# Patient Record
Sex: Male | Born: 1955
Health system: Southern US, Community
[De-identification: ages and names within clinical notes are randomized; demographics above are authoritative.]

## PROBLEM LIST (undated history)

## (undated) DIAGNOSIS — Z7902 Long term (current) use of antithrombotics/antiplatelets: Secondary | ICD-10-CM

## (undated) DIAGNOSIS — I779 Disorder of arteries and arterioles, unspecified: Secondary | ICD-10-CM

## (undated) DIAGNOSIS — N433 Hydrocele, unspecified: Secondary | ICD-10-CM

## (undated) DIAGNOSIS — E785 Hyperlipidemia, unspecified: Secondary | ICD-10-CM

## (undated) DIAGNOSIS — Q2112 Patent foramen ovale: Secondary | ICD-10-CM

## (undated) DIAGNOSIS — Q211 Atrial septal defect: Secondary | ICD-10-CM

## (undated) DIAGNOSIS — I639 Cerebral infarction, unspecified: Secondary | ICD-10-CM

## (undated) DIAGNOSIS — Z720 Tobacco use: Secondary | ICD-10-CM

## (undated) DIAGNOSIS — B1920 Unspecified viral hepatitis C without hepatic coma: Secondary | ICD-10-CM

## (undated) DIAGNOSIS — J449 Chronic obstructive pulmonary disease, unspecified: Secondary | ICD-10-CM

## (undated) DIAGNOSIS — I739 Peripheral vascular disease, unspecified: Secondary | ICD-10-CM

## (undated) DIAGNOSIS — K7400 Hepatic fibrosis, unspecified: Secondary | ICD-10-CM

## (undated) HISTORY — DX: Peripheral vascular disease, unspecified: I73.9

## (undated) HISTORY — PX: JOINT REPLACEMENT: SHX530

## (undated) HISTORY — DX: Tobacco use: Z72.0

## (undated) HISTORY — PX: KNEE ARTHROSCOPY: SUR90

## (undated) HISTORY — PX: BACK SURGERY: SHX140

## (undated) HISTORY — DX: Hyperlipidemia, unspecified: E78.5

## (undated) HISTORY — DX: Disorder of arteries and arterioles, unspecified: I77.9

## (undated) HISTORY — DX: Atrial septal defect: Q21.1

## (undated) HISTORY — DX: Patent foramen ovale: Q21.12

## (undated) HISTORY — DX: Cerebral infarction, unspecified: I63.9

---

## 2010-10-31 DIAGNOSIS — J189 Pneumonia, unspecified organism: Secondary | ICD-10-CM

## 2010-10-31 HISTORY — DX: Pneumonia, unspecified organism: J18.9

## 2010-11-02 ENCOUNTER — Observation Stay (HOSPITAL_COMMUNITY)
Admission: EM | Admit: 2010-11-02 | Discharge: 2010-11-03 | Payer: Self-pay | Source: Home / Self Care | Attending: Internal Medicine | Admitting: Internal Medicine

## 2010-11-03 LAB — COMPREHENSIVE METABOLIC PANEL
ALT: 23 U/L (ref 0–53)
AST: 25 U/L (ref 0–37)
Albumin: 3.2 g/dL — ABNORMAL LOW (ref 3.5–5.2)
Alkaline Phosphatase: 53 U/L (ref 39–117)
BUN: 8 mg/dL (ref 6–23)
CO2: 26 mEq/L (ref 19–32)
Calcium: 8.6 mg/dL (ref 8.4–10.5)
Chloride: 105 mEq/L (ref 96–112)
Creatinine, Ser: 0.8 mg/dL (ref 0.4–1.5)
GFR calc Af Amer: >60 mL/min (ref >60)
GFR calc non Af Amer: >60 mL/min (ref >60)
Glucose, Bld: 108 mg/dL — ABNORMAL HIGH (ref 70–99)
Potassium: 3.7 mEq/L (ref 3.5–5.1)
Sodium: 136 mEq/L (ref 135–145)
Total Bilirubin: 0.6 mg/dL (ref 0.3–1.2)
Total Protein: 6.4 g/dL (ref 6.0–8.3)

## 2010-11-03 LAB — URINALYSIS, ROUTINE W REFLEX MICROSCOPIC
Bilirubin Urine: NEGATIVE
Hemoglobin, Urine: NEGATIVE
Ketones, ur: NEGATIVE mg/dL
Nitrite: NEGATIVE
Protein, ur: NEGATIVE mg/dL
Specific Gravity, Urine: >1.046 — ABNORMAL HIGH (ref 1.005–1.030)
Urine Glucose, Fasting: NEGATIVE mg/dL
Urobilinogen, UA: 0.2 mg/dL (ref 0.0–1.0)
pH: 6 (ref 5.0–8.0)

## 2010-11-03 LAB — MAGNESIUM: Magnesium: 2.4 mg/dL (ref 1.5–2.5)

## 2010-11-03 LAB — CBC
HCT: 44.1 % (ref 39.0–52.0)
Hemoglobin: 15.1 g/dL (ref 13.0–17.0)
MCH: 32.5 pg (ref 26.0–34.0)
MCHC: 34.2 g/dL (ref 30.0–36.0)
MCV: 95 fL (ref 78.0–100.0)
Platelets: 274 10*3/uL (ref 150–400)
RBC: 4.64 MIL/uL (ref 4.22–5.81)
RDW: 13.1 % (ref 11.5–15.5)
WBC: 7.6 10*3/uL (ref 4.0–10.5)

## 2010-11-03 LAB — CARDIAC PANEL(CRET KIN+CKTOT+MB+TROPI)
CK, MB: 1 ng/mL (ref 0.3–4.0)
Relative Index: INVALID (ref 0.0–2.5)
Total CK: 58 U/L (ref 7–232)
Troponin I: 0.01 ng/mL (ref 0.00–0.06)

## 2011-01-10 LAB — T4, FREE: Free T4: 1.02 ng/dL (ref 0.80–1.80)

## 2011-01-10 LAB — CBC
HCT: 44.7 % (ref 39.0–52.0)
Hemoglobin: 15.6 g/dL (ref 13.0–17.0)
MCH: 33.1 pg (ref 26.0–34.0)
MCHC: 34.9 g/dL (ref 30.0–36.0)
MCV: 94.9 fL (ref 78.0–100.0)
Platelets: 351 10*3/uL (ref 150–400)
RBC: 4.71 MIL/uL (ref 4.22–5.81)
RDW: 13.1 % (ref 11.5–15.5)
WBC: 10 10*3/uL (ref 4.0–10.5)

## 2011-01-10 LAB — POCT CARDIAC MARKERS
CKMB, poc: <1 ng/mL — ABNORMAL LOW (ref 1.0–8.0)
Myoglobin, poc: 44 ng/mL (ref 12–200)
Troponin i, poc: <0.05 ng/mL (ref 0.00–0.09)

## 2011-01-10 LAB — POCT I-STAT, CHEM 8
BUN: 9 mg/dL (ref 6–23)
Calcium, Ion: 1.1 mmol/L — ABNORMAL LOW (ref 1.12–1.32)
Chloride: 105 mEq/L (ref 96–112)
Creatinine, Ser: 0.8 mg/dL (ref 0.4–1.5)
Glucose, Bld: 86 mg/dL (ref 70–99)
HCT: 47 % (ref 39.0–52.0)
Hemoglobin: 16 g/dL (ref 13.0–17.0)
Potassium: 3.6 mEq/L (ref 3.5–5.1)
Sodium: 139 mEq/L (ref 135–145)
TCO2: 25 mmol/L (ref 0–100)

## 2011-01-10 LAB — TSH: TSH: 3.249 u[IU]/mL (ref 0.350–4.500)

## 2012-08-15 ENCOUNTER — Ambulatory Visit (HOSPITAL_COMMUNITY)
Admission: RE | Admit: 2012-08-15 | Discharge: 2012-08-15 | Disposition: A | Discharge status: Home / Self Care | Payer: BC Managed Care – PPO | Source: Ambulatory Visit | Attending: Specialist | Admitting: Specialist

## 2012-08-15 ENCOUNTER — Other Ambulatory Visit (HOSPITAL_COMMUNITY): Payer: Self-pay | Admitting: Specialist

## 2012-08-15 DIAGNOSIS — Z139 Encounter for screening, unspecified: Secondary | ICD-10-CM

## 2012-08-15 DIAGNOSIS — Z1389 Encounter for screening for other disorder: Secondary | ICD-10-CM | POA: Insufficient documentation

## 2017-10-09 ENCOUNTER — Encounter (HOSPITAL_COMMUNITY)
Admission: EM | Disposition: A | Discharge status: Home / Self Care | Payer: Self-pay | Source: Home / Self Care | Attending: Neurology

## 2017-10-09 ENCOUNTER — Encounter (HOSPITAL_COMMUNITY): Payer: Self-pay | Admitting: Emergency Medicine

## 2017-10-09 ENCOUNTER — Inpatient Hospital Stay (HOSPITAL_COMMUNITY): Payer: 59

## 2017-10-09 ENCOUNTER — Emergency Department (HOSPITAL_COMMUNITY): Payer: 59

## 2017-10-09 ENCOUNTER — Emergency Department (HOSPITAL_COMMUNITY): Payer: 59 | Admitting: Anesthesiology

## 2017-10-09 ENCOUNTER — Inpatient Hospital Stay (HOSPITAL_COMMUNITY)
Admission: EM | Admit: 2017-10-09 | Discharge: 2017-10-11 | DRG: 023 | Disposition: A | Discharge status: Home / Self Care | Payer: 59 | Attending: Neurology | Admitting: Neurology

## 2017-10-09 DIAGNOSIS — E785 Hyperlipidemia, unspecified: Secondary | ICD-10-CM | POA: Diagnosis present

## 2017-10-09 DIAGNOSIS — R29718 NIHSS score 18: Secondary | ICD-10-CM | POA: Diagnosis not present

## 2017-10-09 DIAGNOSIS — G8194 Hemiplegia, unspecified affecting left nondominant side: Secondary | ICD-10-CM | POA: Diagnosis present

## 2017-10-09 DIAGNOSIS — I63511 Cerebral infarction due to unspecified occlusion or stenosis of right middle cerebral artery: Principal | ICD-10-CM | POA: Diagnosis present

## 2017-10-09 DIAGNOSIS — R29708 NIHSS score 8: Secondary | ICD-10-CM | POA: Diagnosis not present

## 2017-10-09 DIAGNOSIS — R414 Neurologic neglect syndrome: Secondary | ICD-10-CM | POA: Diagnosis present

## 2017-10-09 DIAGNOSIS — R29721 NIHSS score 21: Secondary | ICD-10-CM | POA: Diagnosis not present

## 2017-10-09 DIAGNOSIS — D72829 Elevated white blood cell count, unspecified: Secondary | ICD-10-CM | POA: Diagnosis present

## 2017-10-09 DIAGNOSIS — J9601 Acute respiratory failure with hypoxia: Secondary | ICD-10-CM | POA: Diagnosis not present

## 2017-10-09 DIAGNOSIS — R29810 Facial weakness: Secondary | ICD-10-CM | POA: Diagnosis present

## 2017-10-09 DIAGNOSIS — F17210 Nicotine dependence, cigarettes, uncomplicated: Secondary | ICD-10-CM | POA: Diagnosis present

## 2017-10-09 DIAGNOSIS — I6521 Occlusion and stenosis of right carotid artery: Secondary | ICD-10-CM | POA: Diagnosis present

## 2017-10-09 DIAGNOSIS — I63231 Cerebral infarction due to unspecified occlusion or stenosis of right carotid arteries: Secondary | ICD-10-CM | POA: Diagnosis not present

## 2017-10-09 DIAGNOSIS — R339 Retention of urine, unspecified: Secondary | ICD-10-CM | POA: Diagnosis not present

## 2017-10-09 DIAGNOSIS — I779 Disorder of arteries and arterioles, unspecified: Secondary | ICD-10-CM

## 2017-10-09 DIAGNOSIS — I63311 Cerebral infarction due to thrombosis of right middle cerebral artery: Secondary | ICD-10-CM | POA: Diagnosis not present

## 2017-10-09 DIAGNOSIS — R29717 NIHSS score 17: Secondary | ICD-10-CM | POA: Diagnosis not present

## 2017-10-09 DIAGNOSIS — I10 Essential (primary) hypertension: Secondary | ICD-10-CM | POA: Diagnosis present

## 2017-10-09 DIAGNOSIS — I639 Cerebral infarction, unspecified: Secondary | ICD-10-CM

## 2017-10-09 DIAGNOSIS — I6789 Other cerebrovascular disease: Secondary | ICD-10-CM | POA: Diagnosis not present

## 2017-10-09 DIAGNOSIS — R1310 Dysphagia, unspecified: Secondary | ICD-10-CM | POA: Diagnosis present

## 2017-10-09 HISTORY — PX: IR INTRAVSC STENT CERV CAROTID W/O EMB-PROT MOD SED INC ANGIO: IMG2304

## 2017-10-09 HISTORY — PX: IR ANGIO EXTRACRAN SEL COM CAROTID INNOMINATE UNI L MOD SED: IMG5355

## 2017-10-09 HISTORY — PX: RADIOLOGY WITH ANESTHESIA: SHX6223

## 2017-10-09 HISTORY — PX: IR ANGIO VERTEBRAL SEL SUBCLAVIAN INNOMINATE UNI R MOD SED: IMG5365

## 2017-10-09 HISTORY — DX: Disorder of arteries and arterioles, unspecified: I77.9

## 2017-10-09 HISTORY — DX: Cerebral infarction due to unspecified occlusion or stenosis of right middle cerebral artery: I63.511

## 2017-10-09 HISTORY — PX: IR PERCUTANEOUS ART THROMBECTOMY/INFUSION INTRACRANIAL INC DIAG ANGIO: IMG6087

## 2017-10-09 LAB — I-STAT CHEM 8, ED
BUN: 9 mg/dL (ref 6–20)
Calcium, Ion: 1.09 mmol/L — ABNORMAL LOW (ref 1.15–1.40)
Chloride: 105 mmol/L (ref 101–111)
Creatinine, Ser: 0.7 mg/dL (ref 0.61–1.24)
Glucose, Bld: 96 mg/dL (ref 65–99)
HCT: 50 % (ref 39.0–52.0)
Hemoglobin: 17 g/dL (ref 13.0–17.0)
Potassium: 3.7 mmol/L (ref 3.5–5.1)
Sodium: 138 mmol/L (ref 135–145)
TCO2: 22 mmol/L (ref 22–32)

## 2017-10-09 LAB — DIFFERENTIAL
Basophils Absolute: 0.1 10*3/uL (ref 0.0–0.1)
Basophils Relative: 1 %
Eosinophils Absolute: 0.1 10*3/uL (ref 0.0–0.7)
Eosinophils Relative: 1 %
Lymphocytes Relative: 26 %
Lymphs Abs: 2.6 10*3/uL (ref 0.7–4.0)
Monocytes Absolute: 0.6 10*3/uL (ref 0.1–1.0)
Monocytes Relative: 7 %
Neutro Abs: 6.5 10*3/uL (ref 1.7–7.7)
Neutrophils Relative %: 65 %

## 2017-10-09 LAB — CBC
HCT: 47.5 % (ref 39.0–52.0)
Hemoglobin: 16.6 g/dL (ref 13.0–17.0)
MCH: 32.4 pg (ref 26.0–34.0)
MCHC: 34.9 g/dL (ref 30.0–36.0)
MCV: 92.8 fL (ref 78.0–100.0)
Platelets: 208 10*3/uL (ref 150–400)
RBC: 5.12 MIL/uL (ref 4.22–5.81)
RDW: 13.2 % (ref 11.5–15.5)
WBC: 9.9 10*3/uL (ref 4.0–10.5)

## 2017-10-09 LAB — COMPREHENSIVE METABOLIC PANEL
ALT: 29 U/L (ref 17–63)
AST: 25 U/L (ref 15–41)
Albumin: 3.9 g/dL (ref 3.5–5.0)
Alkaline Phosphatase: 62 U/L (ref 38–126)
Anion gap: 9 (ref 5–15)
BUN: 9 mg/dL (ref 6–20)
CO2: 21 mmol/L — ABNORMAL LOW (ref 22–32)
Calcium: 9 mg/dL (ref 8.9–10.3)
Chloride: 107 mmol/L (ref 101–111)
Creatinine, Ser: 0.78 mg/dL (ref 0.61–1.24)
GFR calc Af Amer: >60 mL/min (ref >60)
GFR calc non Af Amer: >60 mL/min (ref >60)
Glucose, Bld: 95 mg/dL (ref 65–99)
Potassium: 3.8 mmol/L (ref 3.5–5.1)
Sodium: 137 mmol/L (ref 135–145)
Total Bilirubin: 0.8 mg/dL (ref 0.3–1.2)
Total Protein: 7.1 g/dL (ref 6.5–8.1)

## 2017-10-09 LAB — CBG MONITORING, ED: Glucose-Capillary: 90 mg/dL (ref 65–99)

## 2017-10-09 LAB — PROTIME-INR
INR: 0.95
Prothrombin Time: 12.6 seconds (ref 11.4–15.2)

## 2017-10-09 LAB — I-STAT TROPONIN, ED: Troponin i, poc: 0 ng/mL (ref 0.00–0.08)

## 2017-10-09 LAB — APTT: aPTT: 32 seconds (ref 24–36)

## 2017-10-09 SURGERY — RADIOLOGY WITH ANESTHESIA
Anesthesia: General

## 2017-10-09 MED ORDER — HEPARIN SODIUM (PORCINE) 1000 UNIT/ML IJ SOLN
INTRAMUSCULAR | Status: DC | PRN
  Administered 2017-10-09: 500 [IU] via INTRAVENOUS
  Administered 2017-10-09: 1000 [IU] via INTRAVENOUS

## 2017-10-09 MED ORDER — TIROFIBAN HCL IN NACL 5-0.9 MG/100ML-% IV SOLN
0.1500 ug/kg/min | INTRAVENOUS | Status: DC
  Filled 2017-10-09: qty 100

## 2017-10-09 MED ORDER — STROKE: EARLY STAGES OF RECOVERY BOOK
Freq: Once | Status: AC
  Administered 2017-10-10: 02:00:00
  Filled 2017-10-09: qty 1

## 2017-10-09 MED ORDER — CEFAZOLIN SODIUM-DEXTROSE 2-4 GM/100ML-% IV SOLN
INTRAVENOUS | Status: AC
  Filled 2017-10-09: qty 100

## 2017-10-09 MED ORDER — LACTATED RINGERS IV SOLN
INTRAVENOUS | Status: DC | PRN
  Administered 2017-10-09: 19:00:00 via INTRAVENOUS

## 2017-10-09 MED ORDER — ASPIRIN 325 MG PO TABS
ORAL_TABLET | ORAL | Status: AC
  Filled 2017-10-09: qty 1

## 2017-10-09 MED ORDER — ACETAMINOPHEN 650 MG RE SUPP: 650.0000 mg | RECTAL | Status: DC | PRN

## 2017-10-09 MED ORDER — ASPIRIN 81 MG PO CHEW
CHEWABLE_TABLET | ORAL | Status: AC
  Filled 2017-10-09: qty 1

## 2017-10-09 MED ORDER — ONDANSETRON HCL 4 MG/2ML IJ SOLN: 4.0000 mg | Freq: Four times a day (QID) | INTRAMUSCULAR | Status: DC | PRN

## 2017-10-09 MED ORDER — NICARDIPINE HCL IN NACL 20-0.86 MG/200ML-% IV SOLN
0.0000 mg/h | INTRAVENOUS | Status: DC
  Administered 2017-10-10: 3 mg/h via INTRAVENOUS
  Filled 2017-10-09: qty 200

## 2017-10-09 MED ORDER — FENTANYL CITRATE (PF) 100 MCG/2ML IJ SOLN
INTRAMUSCULAR | Status: DC | PRN
  Administered 2017-10-09: 150 ug via INTRAVENOUS
  Administered 2017-10-09: 50 ug via INTRAVENOUS

## 2017-10-09 MED ORDER — TICAGRELOR 90 MG PO TABS
ORAL_TABLET | ORAL | Status: AC
  Filled 2017-10-09: qty 2

## 2017-10-09 MED ORDER — ACETAMINOPHEN 160 MG/5ML PO SOLN: 650.0000 mg | ORAL | Status: DC | PRN

## 2017-10-09 MED ORDER — SUCCINYLCHOLINE CHLORIDE 20 MG/ML IJ SOLN
INTRAMUSCULAR | Status: DC | PRN
  Administered 2017-10-09: 120 mg via INTRAVENOUS

## 2017-10-09 MED ORDER — ASPIRIN 325 MG PO TABS
ORAL_TABLET | ORAL | Status: AC | PRN
  Administered 2017-10-09: 325 mg

## 2017-10-09 MED ORDER — ACETAMINOPHEN 325 MG PO TABS
650.0000 mg | ORAL_TABLET | ORAL | Status: DC | PRN
  Administered 2017-10-11: 650 mg via ORAL
  Filled 2017-10-09: qty 2

## 2017-10-09 MED ORDER — NITROGLYCERIN 1 MG/10 ML FOR IR/CATH LAB
INTRA_ARTERIAL | Status: AC
  Filled 2017-10-09: qty 10

## 2017-10-09 MED ORDER — FENTANYL CITRATE (PF) 100 MCG/2ML IJ SOLN
INTRAMUSCULAR | Status: AC
  Filled 2017-10-09: qty 4

## 2017-10-09 MED ORDER — EPTIFIBATIDE 20 MG/10ML IV SOLN
INTRAVENOUS | Status: AC
  Filled 2017-10-09: qty 10

## 2017-10-09 MED ORDER — IOPAMIDOL (ISOVUE-300) INJECTION 61%
INTRAVENOUS | Status: AC
  Administered 2017-10-09: 86 mL
  Filled 2017-10-09: qty 300

## 2017-10-09 MED ORDER — GLYCOPYRROLATE 0.2 MG/ML IJ SOLN
INTRAMUSCULAR | Status: DC | PRN
  Administered 2017-10-09 (×2): 0.2 mg via INTRAVENOUS

## 2017-10-09 MED ORDER — ASPIRIN 325 MG PO TABS
325.0000 mg | ORAL_TABLET | Freq: Every day | ORAL | Status: DC
  Administered 2017-10-10 – 2017-10-11 (×2): 325 mg via ORAL
  Filled 2017-10-09 (×2): qty 1

## 2017-10-09 MED ORDER — IOPAMIDOL (ISOVUE-370) INJECTION 76%
INTRAVENOUS | Status: AC
  Administered 2017-10-09: 100 mL
  Filled 2017-10-09: qty 100

## 2017-10-09 MED ORDER — IOPAMIDOL (ISOVUE-300) INJECTION 61%
INTRAVENOUS | Status: AC
  Administered 2017-10-09: 70 mL
  Filled 2017-10-09: qty 150

## 2017-10-09 MED ORDER — CLOPIDOGREL BISULFATE 75 MG PO TABS
ORAL_TABLET | ORAL | Status: AC | PRN
  Administered 2017-10-09: 600 mg

## 2017-10-09 MED ORDER — FENTANYL CITRATE (PF) 100 MCG/2ML IJ SOLN
100.0000 ug | INTRAMUSCULAR | Status: AC | PRN
  Administered 2017-10-10 (×3): 100 ug via INTRAVENOUS
  Filled 2017-10-09 (×2): qty 2

## 2017-10-09 MED ORDER — PANTOPRAZOLE SODIUM 40 MG IV SOLR
40.0000 mg | Freq: Every day | INTRAVENOUS | Status: DC
  Administered 2017-10-10 – 2017-10-11 (×2): 40 mg via INTRAVENOUS
  Filled 2017-10-09 (×2): qty 40

## 2017-10-09 MED ORDER — PROPOFOL 10 MG/ML IV BOLUS
INTRAVENOUS | Status: DC | PRN
  Administered 2017-10-09: 50 mg via INTRAVENOUS
  Administered 2017-10-09: 70 mg via INTRAVENOUS
  Administered 2017-10-09: 50 mg via INTRAVENOUS
  Administered 2017-10-09: 130 mg via INTRAVENOUS

## 2017-10-09 MED ORDER — CLOPIDOGREL BISULFATE 300 MG PO TABS
ORAL_TABLET | ORAL | Status: AC
  Filled 2017-10-09: qty 2

## 2017-10-09 MED ORDER — LIDOCAINE HCL (CARDIAC) 20 MG/ML IV SOLN
INTRAVENOUS | Status: DC | PRN
  Administered 2017-10-09: 80 mg via INTRATRACHEAL

## 2017-10-09 MED ORDER — FENTANYL CITRATE (PF) 100 MCG/2ML IJ SOLN
100.0000 ug | INTRAMUSCULAR | Status: DC | PRN
  Administered 2017-10-10 (×2): 100 ug via INTRAVENOUS
  Filled 2017-10-09 (×3): qty 2

## 2017-10-09 MED ORDER — CEFAZOLIN SODIUM-DEXTROSE 2-3 GM-%(50ML) IV SOLR
INTRAVENOUS | Status: DC | PRN
  Administered 2017-10-09: 2 g via INTRAVENOUS

## 2017-10-09 MED ORDER — SODIUM CHLORIDE 0.9 % IV SOLN
INTRAVENOUS | Status: DC
  Administered 2017-10-11: 05:00:00 via INTRAVENOUS

## 2017-10-09 MED ORDER — TIROFIBAN HCL IN NACL 5-0.9 MG/100ML-% IV SOLN
0.1500 ug/kg/min | INTRAVENOUS | Status: DC
  Filled 2017-10-09 (×3): qty 100

## 2017-10-09 MED ORDER — PROPOFOL 1000 MG/100ML IV EMUL
0.0000 ug/kg/min | INTRAVENOUS | Status: DC
  Administered 2017-10-10: 40 ug/kg/min via INTRAVENOUS
  Administered 2017-10-10: 20 ug/kg/min via INTRAVENOUS
  Filled 2017-10-09 (×2): qty 100

## 2017-10-09 MED ORDER — NITROGLYCERIN 1 MG/10 ML FOR IR/CATH LAB
INTRA_ARTERIAL | Status: AC | PRN
  Administered 2017-10-09 (×3): 25 ug via INTRA_ARTERIAL

## 2017-10-09 MED ORDER — EPTIFIBATIDE 20 MG/10ML IV SOLN
INTRAVENOUS | Status: AC | PRN
  Administered 2017-10-09 (×8): 1.5 mg via INTRAVENOUS

## 2017-10-09 MED ORDER — CLOPIDOGREL BISULFATE 75 MG PO TABS
75.0000 mg | ORAL_TABLET | Freq: Every day | ORAL | Status: DC
  Administered 2017-10-10 – 2017-10-11 (×2): 75 mg via ORAL
  Filled 2017-10-09 (×2): qty 1

## 2017-10-09 MED ORDER — PHENYLEPHRINE HCL 10 MG/ML IJ SOLN
INTRAVENOUS | Status: DC | PRN
  Administered 2017-10-09: 50 ug/min via INTRAVENOUS

## 2017-10-09 MED ORDER — SODIUM CHLORIDE 0.9 % IV SOLN
1.0000 g | Freq: Once | INTRAVENOUS | Status: AC
  Administered 2017-10-10: 1 g via INTRAVENOUS
  Filled 2017-10-09: qty 10

## 2017-10-09 MED ORDER — SODIUM CHLORIDE 0.9 % IV SOLN
INTRAVENOUS | Status: DC
  Administered 2017-10-10 (×2): via INTRAVENOUS

## 2017-10-09 MED ORDER — ACETAMINOPHEN 325 MG PO TABS: 650.0000 mg | ORAL_TABLET | ORAL | Status: DC | PRN

## 2017-10-09 MED ORDER — ROCURONIUM BROMIDE 100 MG/10ML IV SOLN
INTRAVENOUS | Status: DC | PRN
  Administered 2017-10-09 (×3): 50 mg via INTRAVENOUS

## 2017-10-09 NOTE — Transfer of Care (Signed)
Immediate Anesthesia Transfer of Care Note  Patient: Raymond Lowery  Procedure(s) Performed: RADIOLOGY WITH ANESTHESIA (N/A )  Patient Location: ICU  Anesthesia Type:General  Level of Consciousness: sedated and Patient remains intubated per anesthesia plan  Airway & Oxygen Therapy: Patient remains intubated per anesthesia plan and Patient placed on Ventilator (see vital sign flow sheet for setting)  Post-op Assessment: Report given to RN and Post -op Vital signs reviewed and stable  Post vital signs: Reviewed and stable  Last Vitals:  Vitals:   10/09/17 1824 10/09/17 1830  BP:  134/75  Pulse: 76 79  Resp: (!) 23 (!) 21  Temp:    SpO2: 94% 96%    Last Pain:  Vitals:   10/09/17 1830  TempSrc:   PainSc: 1          Complications: No apparent anesthesia complications 

## 2017-10-09 NOTE — Progress Notes (Signed)
eLink Physician-Brief Progress Note Patient Name: Raymond Lowery DOB: [birth year] MRN: [id number]   Date of Service  10/09/2017  HPI/Events of Note  Request to place Foley Catheter. Patient is intubated and ventilated.   eICU Interventions  Will place Foley Catheter.      Intervention Category Major Interventions: Other:  Raymond Lowery,Raymond Lowery 10/09/2017, 11:33 PM

## 2017-10-09 NOTE — Anesthesia Procedure Notes (Signed)
Procedure Name: Intubation Date/Time: 10/09/2017 7:02 PM Performed by: Claris Che, CRNA Pre-anesthesia Checklist: Patient identified, Emergency Drugs available, Suction available, Patient being monitored and Timeout performed Patient Re-evaluated:Patient Re-evaluated prior to induction Oxygen Delivery Method: Circle system utilized Preoxygenation: Pre-oxygenation with 100% oxygen Induction Type: IV induction, Rapid sequence and Cricoid Pressure applied Laryngoscope Size: Mac and 3 Grade View: Grade II Tube type: Subglottic suction tube Tube size: 7.5 mm Number of attempts: 1 Airway Equipment and Method: Stylet Placement Confirmation: ETT inserted through vocal cords under direct vision,  positive ETCO2 and breath sounds checked- equal and bilateral Secured at: 23 cm Tube secured with: Tape Dental Injury: Teeth and Oropharynx as per pre-operative assessment

## 2017-10-09 NOTE — Transfer of Care (Deleted)
Immediate Anesthesia Transfer of Care Note  Patient: Raymond Lowery  Procedure(s) Performed: RADIOLOGY WITH ANESTHESIA (N/A )  Patient Location: ICU  Anesthesia Type:General  Level of Consciousness: sedated and Patient remains intubated per anesthesia plan  Airway & Oxygen Therapy: Patient remains intubated per anesthesia plan and Patient placed on Ventilator (see vital sign flow sheet for setting)  Post-op Assessment: Report given to RN and Post -op Vital signs reviewed and stable  Post vital signs: Reviewed and stable  Last Vitals:  Vitals:   10/09/17 1824 10/09/17 1830  BP:  134/75  Pulse: 76 79  Resp: (!) 23 (!) 21  Temp:    SpO2: 94% 96%    Last Pain:  Vitals:   10/09/17 1830  TempSrc:   PainSc: 1          Complications: No apparent anesthesia complications

## 2017-10-09 NOTE — Anesthesia Preprocedure Evaluation (Addendum)
Anesthesia Evaluation  Patient identified by MRN, date of birth, ID band Patient awake    Reviewed: Allergy & Precautions, H&P , NPO status , Patient's Chart, lab work & pertinent test results  Airway Mallampati: I  TM Distance: >3 FB Neck ROM: Full    Dental no notable dental hx. (+) Edentulous Upper, Edentulous Lower, Dental Advisory Given   Pulmonary neg pulmonary ROS,    Pulmonary exam normal breath sounds clear to auscultation       Cardiovascular negative cardio ROS   Rhythm:Regular Rate:Normal     Neuro/Psych CVA, Residual Symptoms negative psych ROS   GI/Hepatic negative GI ROS, Neg liver ROS,   Endo/Other  negative endocrine ROS  Renal/GU negative Renal ROS  negative genitourinary   Musculoskeletal   Abdominal   Peds  Hematology negative hematology ROS (+)   Anesthesia Other Findings   Reproductive/Obstetrics negative OB ROS                            Anesthesia Physical Anesthesia Plan  ASA: III and emergent  Anesthesia Plan: General   Post-op Pain Management:    Induction: Intravenous, Rapid sequence and Cricoid pressure planned  PONV Risk Score and Plan: 2 and Treatment may vary due to age or medical condition and Ondansetron  Airway Management Planned: Oral ETT  Additional Equipment: Arterial line  Intra-op Plan:   Post-operative Plan: Post-operative intubation/ventilation  Informed Consent: I have reviewed the patients History and Physical, chart, labs and discussed the procedure including the risks, benefits and alternatives for the proposed anesthesia with the patient or authorized representative who has indicated his/her understanding and acceptance.   Dental advisory given  Plan Discussed with: CRNA and Surgeon  Anesthesia Plan Comments:         Anesthesia Quick Evaluation

## 2017-10-09 NOTE — H&P (Addendum)
Neurology H&P  CC: Left Sided weakness  History is obtained from:Patient  HPI: Raymond Lowery is a 61 y.o. male with a history of tobacco abuse who presents with left sided weakness. He had transient symptoms this morning and subsequently improved. He actually worked for most of the day, but then hais wife saw him reaching with his right arm because he could not use his left arm well.  She therefore called 911.  She states that his left side has seemed to get somewhat better in the interim, but he still has significant deficits.   LKW: 0815am  tpa given?: no, outside of window.   ROS: A 14 point ROS was performed and is negative except as noted in the HPI.   PMHx: Tobacco abuse   FHx: No history of stroke  Social History: + smoker   Exam: Current vital signs: Vitals:   10/09/17 1824 10/09/17 1830  BP:  134/75  Pulse: 76 79  Resp: (!) 23 (!) 21  Temp:    SpO2: 94% 96%   Vital signs in last 24 hours:    Physical Exam  Constitutional: Appears well-developed and well-nourished.  Psych: Affect appropriate to situation Eyes: No scleral injection HENT: No OP obstrucion Head: Normocephalic.  Cardiovascular: Normal rate and regular rhythm.  Respiratory: Effort normal and breath sounds normal to anterior ascultation GI: Soft.  No distension. There is no tenderness.  Skin: WDI  Neuro: Mental Status: Patient is awake, alert, oriented to person, place, month, year, and situation. Patient is able to give a clear and coherent history. No signs of aphasia  He has significant left hemineglect Cranial Nerves: II: Visual Fields with partial left upper field cut. Pupils are equal, round, and reactive to light.   III,IV, VI: Right gaze preference, but does cross midline to the left V: Facial sensation is symmetric to temperature VII: Facial movement is decreased on the left VIII: hearing is intact to voice X: Uvula elevates symmetrically XI: Shoulder shrug is  symmetric. XII: tongue is midline without atrophy or fasciculations.  Motor: Tone is normal. Bulk is normal. 5/5 strength was present on the right side, 4/5 in the left Sensory: Sensation is decreased on the left, but patient does not seem aware of this, he does extinguish Cerebellar: No ataxia on the right, consistent with weakness on the  I have reviewed labs in epic and the results pertinent to this consultation are: CMP-unremarkable CBC-unremarkable  I have reviewed the images obtained: CT perfusion-large area of penumbra.  Impression: 61 year old male with right M1 occlusion, I suspect secondary to carotid disease.  He is going to IR for reperfusion. Though he had significant improvement in the interim between his initial symptoms and subsequent worsening, with his neglect and fact that he already has CT change, I am concerned he may have had some injury this morning. I would therefore favor pursuing reperfusion with IR therapy rather than tPA.   Recommendations: 1. Stroke intervention 2. MRI  of the brain without contrast 3. Frequent neuro checks 4. Echocardiogram 5. HgbA1c, fasting lipid panel 6. Prophylactic therapy-likely will need dual antiplatelet therapy if he receives a stent  7. Risk factor modification 8. Telemetry monitoring 9. PT consult, OT consult, Speech consult 10. please page stroke NP  Or  PA  Or MD  from 8am -4 pm as this patient will be followed by the stroke team at this point.   You can look them up on [email or web address]      This patient  is critically ill and at significant risk of neurological worsening, death and care requires constant monitoring of vital signs, hemodynamics,respiratory and cardiac monitoring, neurological assessment, discussion with family, other specialists and medical decision making of high complexity. I spent 60 minutes of neurocritical care time  in the care of  this patient.  Ritta SlotMcNeill Samra Pesch, MD [facility] [phone]  If 7pm- 7am, please page neurology on call as listed in AMION. 10/09/2017  5:53 PM

## 2017-10-09 NOTE — ED Triage Notes (Signed)
Pt arrives via EMS from home with complaints of slurred speech, left side weakness and facial droop. Per wife pt had slurred speech and facial droop at 8:15 but resolved immediately. Then pt began having left side weakness and drooping at 16:15. Code stroke called at [id number]

## 2017-10-09 NOTE — Consult Note (Signed)
PULMONARY / CRITICAL CARE MEDICINE   Name: Raymond Lowery MRN: [id number] DOB: 04-Dec-1955    ADMISSION DATE:  10/09/2017 CONSULTATION DATE:  10/09/17  REFERRING MD:  Amada Jupiter  CHIEF COMPLAINT:  Weakness  HISTORY OF PRESENT ILLNESS:  Pt is encephelopathic; therefore, this HPI is obtained from chart review. Raymond Lowery is a 61 y.o. male with PMH as outlined below. He was brought to [facility] ED 12/10 with left hemiparesis that started 2 hours prior to ED presentation.  He did have ataxia, drooling, and weakness around 7:30AM that same day, but this resolved spontaneously after a few minutes.  He had CT of the head that was concerning for right MCA infarct.  CTA confirmed right MCA occlusion.  He was taken to IR where he had complete revascularization.  He later returned to the neuro ICU where he remained ventilated and PCCM was asked to assist with vent management.  PAST MEDICAL HISTORY :  He  has no past medical history on file.  PAST SURGICAL HISTORY: He  has no past surgical history on file.  Not on File  No current facility-administered medications on file prior to encounter.    No current outpatient medications on file prior to encounter.    FAMILY HISTORY:  His has no family status information on file.    SOCIAL HISTORY: He    REVIEW OF SYSTEMS:   Unable to obtain as pt is encephalopathic.  SUBJECTIVE:  On vent, unresponsive.  VITAL SIGNS: BP 134/75   Pulse 79   Temp 98.6 F (37 C) (Oral)   Resp (!) 21   Wt 74.9 kg (165 lb 2 oz)   SpO2 96%   HEMODYNAMICS:    VENTILATOR SETTINGS:    INTAKE / OUTPUT: No intake/output data recorded.   PHYSICAL EXAMINATION: General: Adult male, in NAD. Neuro: Sedated, does not follow commands but does move BLE spontaneously. HEENT: Cervical collar in place.  /AT. PERRL, sclerae anicteric. Cardiovascular: RRR, no M/R/G. Lungs: Respirations even and unlabored.  CTA bilaterally, No W/R/R. Abdomen: BS x 4, soft,  NT/ND. Musculoskeletal: No gross deformities, no edema. Skin: Intact, warm, no rashes.  LABS:  BMET Recent Labs  Lab 10/09/17 1749 10/09/17 1756  NA 137 138  K 3.8 3.7  CL 107 105  CO2 21*  --   BUN 9 9  CREATININE 0.78 0.70  GLUCOSE 95 96    Electrolytes Recent Labs  Lab 10/09/17 1749  CALCIUM 9.0    CBC Recent Labs  Lab 10/09/17 1749 10/09/17 1756  WBC 9.9  --   HGB 16.6 17.0  HCT 47.5 50.0  PLT 208  --     Coag's Recent Labs  Lab 10/09/17 1749  APTT 32  INR 0.95    Sepsis Markers No results for input(s): LATICACIDVEN, PROCALCITON, O2SATVEN in the last 168 hours.  ABG No results for input(s): PHART, PCO2ART, PO2ART in the last 168 hours.  Liver Enzymes Recent Labs  Lab 10/09/17 1749  AST 25  ALT 29  ALKPHOS 62  BILITOT 0.8  ALBUMIN 3.9    Cardiac Enzymes No results for input(s): TROPONINI, PROBNP in the last 168 hours.  Glucose Recent Labs  Lab 10/09/17 1747  GLUCAP 90    Imaging Ct Angio Head W Or Wo Contrast  Result Date: 10/09/2017 CLINICAL DATA:  Left facial droop and left-sided weakness. EXAM: CT ANGIOGRAPHY HEAD AND NECK CT PERFUSION BRAIN TECHNIQUE: Multidetector CT imaging of the head and neck was performed using the standard protocol  during bolus administration of intravenous contrast. Multiplanar CT image reconstructions and MIPs were obtained to evaluate the vascular anatomy. Carotid stenosis measurements (when applicable) are obtained utilizing NASCET criteria, using the distal internal carotid diameter as the denominator. Multiphase CT imaging of the brain was performed following IV bolus contrast injection. Subsequent parametric perfusion maps were calculated using RAPID software. CONTRAST:  ISOVUE-370 IOPAMIDOL (ISOVUE-370) INJECTION 76% COMPARISON:  Head CT same day FINDINGS: CTA NECK FINDINGS Aortic arch: There is minimal calcific atherosclerosis of the aortic arch. There is no aneurysm, dissection or  hemodynamically significant stenosis of the visualized ascending aorta and aortic arch. Normal variant aortic arch branching pattern with the brachiocephalic and left common carotid arteries sharing a common origin. Mild atherosclerotic plaque in the left subclavian artery origin. No hemodynamically significant subclavian artery stenosis. Right carotid system: Right common carotid artery is widely patent. There is predominantly noncalcified plaque at the carotid bifurcation with complete occlusion of the right internal carotid artery origin. There is critical stenosis of the proximal right internal carotid artery with a string sign mid extends approximately to the skullbase. There is complete occlusion of the skullbase segments of the right ICA with short segment reconstitution of the communicating segment. Left carotid system: The left common carotid origin is widely patent. There is no common carotid or internal carotid artery dissection or aneurysm. Atherosclerotic calcification at the carotid bifurcation without hemodynamically significant stenosis. Vertebral arteries: The vertebral system is codominant. Both vertebral artery origins are normal. Both vertebral arteries are normal to their confluence with the basilar artery. Skeleton: There is no bony spinal canal stenosis. No lytic or blastic lesions. Other neck: The nasopharynx is clear. The oropharynx and hypopharynx are normal. The epiglottis is normal. The supraglottic larynx, glottis and subglottic larynx are normal. No retropharyngeal collection. The parapharyngeal spaces are preserved. The parotid and submandibular glands are normal. No sialolithiasis or salivary ductal dilatation. The thyroid gland is normal. There is no cervical lymphadenopathy. Upper chest: No pneumothorax or pleural effusion. No nodules or masses. Review of the MIP images confirms the above findings CTA HEAD FINDINGS Anterior circulation: --Intracranial internal carotid arteries:  There is short segment opacification of the communicating portion of the right internal carotid artery, which is otherwise occluded. --Anterior cerebral arteries: Limited opacification of the right A1 segment. Otherwise normal anterior cerebral arteries. --Middle cerebral arteries: The right middle cerebral artery is occluded at its origin with thrombus that measures approximately 2.2 cm. There is severely attenuated enhancement of the distal MCA branches. Poor collateralization within the right MCA territory. The left middle cerebral artery is normal. --Posterior communicating arteries: There is a small right posterior communicating artery. None is visualized on the left. Posterior circulation: --Posterior cerebral arteries: Normal. --Superior cerebellar arteries: Normal. --Basilar artery: Normal. --Anterior inferior cerebellar arteries: Normal. --Posterior inferior cerebellar arteries: Normal. Venous sinuses: As permitted by contrast timing, patent. Anatomic variants: None Delayed phase: Not performed. Review of the MIP images confirms the above findings CT Brain Perfusion Findings: CBF (<30%) Volume: 17mL Perfusion (Tmax>6.0s) volume: Mismatch Volume: Infarction Location:Right insula and adjacent right frontal white matter. IMPRESSION: 1. Emergent large vessel occlusion of the right middle cerebral artery at its origin, in addition to long segment occlusion/critical stenosis of the right internal carotid artery beginning at the carotid bifurcation with a CT angiographic string sign extending to the skullbase, where the artery is completely occluded. Enhancement at the terminal aspect of the right internal carotid artery the collateral flow from the anterior communicating  and right posterior communicating arteries. 2. Expansile thrombus within the M1 segment of the right middle cerebral artery measures approximately 2 cm. Poor collateralization of the right MCA territory. 3. With perfusion findings  indicating 17 mL infarction volume within the right insula and adjacent centrum semiovale, with 141 mL of ischemic penumbra occupying the remainder of the right middle cerebral artery territory. 4. No hemodynamically significant stenosis of the left internal carotid artery or vertebral arteries. Critical Value/emergent results were called by telephone at the time of interpretation on 10/09/2017 at 6:30 pm to Dr. Ritta Slot , who verbally acknowledged these results. Electronically Signed   By: Deatra Robinson M.D.   On: 10/09/2017 18:59   Ct Angio Neck W Or Wo Contrast  Result Date: 10/09/2017 CLINICAL DATA:  Left facial droop and left-sided weakness. EXAM: CT ANGIOGRAPHY HEAD AND NECK CT PERFUSION BRAIN TECHNIQUE: Multidetector CT imaging of the head and neck was performed using the standard protocol during bolus administration of intravenous contrast. Multiplanar CT image reconstructions and MIPs were obtained to evaluate the vascular anatomy. Carotid stenosis measurements (when applicable) are obtained utilizing NASCET criteria, using the distal internal carotid diameter as the denominator. Multiphase CT imaging of the brain was performed following IV bolus contrast injection. Subsequent parametric perfusion maps were calculated using RAPID software. CONTRAST:  ISOVUE-370 IOPAMIDOL (ISOVUE-370) INJECTION 76% COMPARISON:  Head CT same day FINDINGS: CTA NECK FINDINGS Aortic arch: There is minimal calcific atherosclerosis of the aortic arch. There is no aneurysm, dissection or hemodynamically significant stenosis of the visualized ascending aorta and aortic arch. Normal variant aortic arch branching pattern with the brachiocephalic and left common carotid arteries sharing a common origin. Mild atherosclerotic plaque in the left subclavian artery origin. No hemodynamically significant subclavian artery stenosis. Right carotid system: Right common carotid artery is widely patent. There is  predominantly noncalcified plaque at the carotid bifurcation with complete occlusion of the right internal carotid artery origin. There is critical stenosis of the proximal right internal carotid artery with a string sign mid extends approximately to the skullbase. There is complete occlusion of the skullbase segments of the right ICA with short segment reconstitution of the communicating segment. Left carotid system: The left common carotid origin is widely patent. There is no common carotid or internal carotid artery dissection or aneurysm. Atherosclerotic calcification at the carotid bifurcation without hemodynamically significant stenosis. Vertebral arteries: The vertebral system is codominant. Both vertebral artery origins are normal. Both vertebral arteries are normal to their confluence with the basilar artery. Skeleton: There is no bony spinal canal stenosis. No lytic or blastic lesions. Other neck: The nasopharynx is clear. The oropharynx and hypopharynx are normal. The epiglottis is normal. The supraglottic larynx, glottis and subglottic larynx are normal. No retropharyngeal collection. The parapharyngeal spaces are preserved. The parotid and submandibular glands are normal. No sialolithiasis or salivary ductal dilatation. The thyroid gland is normal. There is no cervical lymphadenopathy. Upper chest: No pneumothorax or pleural effusion. No nodules or masses. Review of the MIP images confirms the above findings CTA HEAD FINDINGS Anterior circulation: --Intracranial internal carotid arteries: There is short segment opacification of the communicating portion of the right internal carotid artery, which is otherwise occluded. --Anterior cerebral arteries: Limited opacification of the right A1 segment. Otherwise normal anterior cerebral arteries. --Middle cerebral arteries: The right middle cerebral artery is occluded at its origin with thrombus that measures approximately 2.2 cm. There is severely attenuated  enhancement of the distal MCA branches. Poor collateralization within the right  MCA territory. The left middle cerebral artery is normal. --Posterior communicating arteries: There is a small right posterior communicating artery. None is visualized on the left. Posterior circulation: --Posterior cerebral arteries: Normal. --Superior cerebellar arteries: Normal. --Basilar artery: Normal. --Anterior inferior cerebellar arteries: Normal. --Posterior inferior cerebellar arteries: Normal. Venous sinuses: As permitted by contrast timing, patent. Anatomic variants: None Delayed phase: Not performed. Review of the MIP images confirms the above findings CT Brain Perfusion Findings: CBF (<30%) Volume: 17mL Perfusion (Tmax>6.0s) volume: 158mL Mismatch Volume: 141mL Infarction Location:Right insula and adjacent right frontal white matter. IMPRESSION: 1. Emergent large vessel occlusion of the right middle cerebral artery at its origin, in addition to long segment occlusion/critical stenosis of the right internal carotid artery beginning at the carotid bifurcation with a CT angiographic string sign extending to the skullbase, where the artery is completely occluded. Enhancement at the terminal aspect of the right internal carotid artery the collateral flow from the anterior communicating and right posterior communicating arteries. 2. Expansile thrombus within the M1 segment of the right middle cerebral artery measures approximately 2 cm. Poor collateralization of the right MCA territory. 3. With perfusion findings indicating 17 mL infarction volume within the right insula and adjacent centrum semiovale, with 141 mL of ischemic penumbra occupying the remainder of the right middle cerebral artery territory. 4. No hemodynamically significant stenosis of the left internal carotid artery or vertebral arteries. Critical Value/emergent results were called by telephone at the time of interpretation on 10/09/2017 at 6:30 pm to Dr. Ritta SlotMCNEILL  KIRKPATRICK , who verbally acknowledged these results. Electronically Signed   By: Deatra RobinsonKevin  Herman M.D.   On: 10/09/2017 18:59   Ct Cerebral Perfusion W Contrast  Result Date: 10/09/2017 CLINICAL DATA:  Left facial droop and left-sided weakness. EXAM: CT ANGIOGRAPHY HEAD AND NECK CT PERFUSION BRAIN TECHNIQUE: Multidetector CT imaging of the head and neck was performed using the standard protocol during bolus administration of intravenous contrast. Multiplanar CT image reconstructions and MIPs were obtained to evaluate the vascular anatomy. Carotid stenosis measurements (when applicable) are obtained utilizing NASCET criteria, using the distal internal carotid diameter as the denominator. Multiphase CT imaging of the brain was performed following IV bolus contrast injection. Subsequent parametric perfusion maps were calculated using RAPID software. CONTRAST:  100mL ISOVUE-370 IOPAMIDOL (ISOVUE-370) INJECTION 76% COMPARISON:  Head CT same day FINDINGS: CTA NECK FINDINGS Aortic arch: There is minimal calcific atherosclerosis of the aortic arch. There is no aneurysm, dissection or hemodynamically significant stenosis of the visualized ascending aorta and aortic arch. Normal variant aortic arch branching pattern with the brachiocephalic and left common carotid arteries sharing a common origin. Mild atherosclerotic plaque in the left subclavian artery origin. No hemodynamically significant subclavian artery stenosis. Right carotid system: Right common carotid artery is widely patent. There is predominantly noncalcified plaque at the carotid bifurcation with complete occlusion of the right internal carotid artery origin. There is critical stenosis of the proximal right internal carotid artery with a string sign mid extends approximately to the skullbase. There is complete occlusion of the skullbase segments of the right ICA with short segment reconstitution of the communicating segment. Left carotid system: The left  common carotid origin is widely patent. There is no common carotid or internal carotid artery dissection or aneurysm. Atherosclerotic calcification at the carotid bifurcation without hemodynamically significant stenosis. Vertebral arteries: The vertebral system is codominant. Both vertebral artery origins are normal. Both vertebral arteries are normal to their confluence with the basilar artery. Skeleton: There is no bony spinal  canal stenosis. No lytic or blastic lesions. Other neck: The nasopharynx is clear. The oropharynx and hypopharynx are normal. The epiglottis is normal. The supraglottic larynx, glottis and subglottic larynx are normal. No retropharyngeal collection. The parapharyngeal spaces are preserved. The parotid and submandibular glands are normal. No sialolithiasis or salivary ductal dilatation. The thyroid gland is normal. There is no cervical lymphadenopathy. Upper chest: No pneumothorax or pleural effusion. No nodules or masses. Review of the MIP images confirms the above findings CTA HEAD FINDINGS Anterior circulation: --Intracranial internal carotid arteries: There is short segment opacification of the communicating portion of the right internal carotid artery, which is otherwise occluded. --Anterior cerebral arteries: Limited opacification of the right A1 segment. Otherwise normal anterior cerebral arteries. --Middle cerebral arteries: The right middle cerebral artery is occluded at its origin with thrombus that measures approximately 2.2 cm. There is severely attenuated enhancement of the distal MCA branches. Poor collateralization within the right MCA territory. The left middle cerebral artery is normal. --Posterior communicating arteries: There is a small right posterior communicating artery. None is visualized on the left. Posterior circulation: --Posterior cerebral arteries: Normal. --Superior cerebellar arteries: Normal. --Basilar artery: Normal. --Anterior inferior cerebellar arteries:  Normal. --Posterior inferior cerebellar arteries: Normal. Venous sinuses: As permitted by contrast timing, patent. Anatomic variants: None Delayed phase: Not performed. Review of the MIP images confirms the above findings CT Brain Perfusion Findings: CBF (<30%) Volume: 17mL Perfusion (Tmax>6.0s) volume: Mismatch Volume: Infarction Location:Right insula and adjacent right frontal white matter. IMPRESSION: 1. Emergent large vessel occlusion of the right middle cerebral artery at its origin, in addition to long segment occlusion/critical stenosis of the right internal carotid artery beginning at the carotid bifurcation with a CT angiographic string sign extending to the skullbase, where the artery is completely occluded. Enhancement at the terminal aspect of the right internal carotid artery the collateral flow from the anterior communicating and right posterior communicating arteries. 2. Expansile thrombus within the M1 segment of the right middle cerebral artery measures approximately 2 cm. Poor collateralization of the right MCA territory. 3. With perfusion findings indicating 17 mL infarction volume within the right insula and adjacent centrum semiovale, with 141 mL of ischemic penumbra occupying the remainder of the right middle cerebral artery territory. 4. No hemodynamically significant stenosis of the left internal carotid artery or vertebral arteries. Critical Value/emergent results were called by telephone at the time of interpretation on 10/09/2017 at 6:30 pm to Dr. Ritta Slot , who verbally acknowledged these results. Electronically Signed   By: Deatra Robinson M.D.   On: 10/09/2017 18:59   Ct Head Code Stroke Wo Contrast  Result Date: 10/09/2017 CLINICAL DATA:  Code stroke.  Left-sided weakness and facial droop EXAM: CT HEAD WITHOUT CONTRAST TECHNIQUE: Contiguous axial images were obtained from the base of the skull through the vertex without intravenous contrast. COMPARISON:  None.  FINDINGS: Brain: There is hypoattenuation within the right insula with loss of the normal insular ribbon. Gray-white differentiation is otherwise preserved. There is no acute hemorrhage. No mass lesion or midline shift. No extra-axial collection. Vascular: There is a hyperdense right middle cerebral artery. Skull: Normal skullbase and calvarium. Sinuses/Orbits: There is near complete opacification of the atelectatic right maxillary sinus. The other paranasal sinuses are clear. No mastoid or middle ear effusion. The orbits are normal. Other: None ASPECTS (Alberta Stroke Program Early CT Score) - Ganglionic level infarction (caudate, lentiform nuclei, internal capsule, insula, M1-M3 cortex): 6 - Supraganglionic infarction (M4-M6 cortex): 3 Total score (  0-10 with 10 being normal): 9 IMPRESSION: 1. No acute hemorrhage. 2. Hypoattenuation within the right insula with hyperdense appearance of the right middle cerebral artery, consistent with acute infarct. 3. ASPECTS is 9. These results were called by telephone at the time of interpretation on 10/09/2017 at 5:58 Pm to Dr. Ritta Slot , who verbally acknowledged these results. Electronically Signed   By: Deatra Robinson M.D.   On: 10/09/2017 18:07     STUDIES:  CTA head 12/10 > right MCA occlusion. MRI brain 12/11 >  Echo 12/11 >  Carotid dopplers 12/11 >   CULTURES: None.  ANTIBIOTICS: None.  SIGNIFICANT EVENTS: 12/10 > admit.  LINES/TUBES: ETT 12/10 >   DISCUSSION: 61 y.o. male admitted 12/10 with acute right MCA infarct.  He was taken to IR where he had complete revascularization.  He then returned to the ICU and remained on vent; therefore, PCCM asked to assist with vent management.  ASSESSMENT / PLAN:  PULMONARY A: Respiratory insufficiency - inability to protect the airway in the setting of acute right MCA infarct. P:   Full vent support. Assess ABG. Wean as able. VAP prevention measures. SBT in AM if neuro status allows. CXR  in AM.  CARDIOVASCULAR A:  No acute issues. P:  BP goals per neurology.  RENAL A:   Hypocalcemia. P:   1g Ca gluconate. NS @ 75. BMP in AM.  GASTROINTESTINAL A:   GI prophylaxis. Nutrition. P:   SUP: Pantoprazole. NPO. SLP eval once extubated.  HEMATOLOGIC A:   VTE Prophylaxis. P:  SCD's. CBC in AM.  INFECTIOUS A:   No indication of infection. P:   Monitor clinically.  ENDOCRINE A: No acute issues. P: No interventions required.  NEUROLOGIC A:   Acute right MCA infarct - s/p complete revascularization by IR 12/10. Sedation due to mechanical ventilation. P:   Neuro following. Stroke workup per neuro. Sedation:  Propofol gtt / Fentanyl PRN. RASS goal: 0 to -1. Daily WUA.  Family updated: No family available.  Interdisciplinary Family Meeting v Palliative Care Meeting:  Due by: 10/15/17.  CC time: 30 min.   Rutherford Guys, [place] - C  Pulmonary & Critical Care Medicine Pager: [phone]  or [phone] 10/09/2017, 11:18 PM

## 2017-10-09 NOTE — ED Notes (Signed)
Patient transported to IR 

## 2017-10-09 NOTE — Sedation Documentation (Signed)
Carotid Stent deployed.

## 2017-10-09 NOTE — Sedation Documentation (Signed)
ACT 169

## 2017-10-09 NOTE — Procedures (Addendum)
S/P bilateral common carotid arteriograms and Rt vert angiogram,followed by comlete revascularizationof occluded RT MCA and terminal RT ICA with  x1 pass with solitaire 4mm x 40 mm retrieval device achieving a TICI 2b reperfusion and complete revascularization of acutelly occluded Rt ICA with stent assisted angioplasty  And use of 1 mg odf IA aggrastat  And  12 mg of IA Integrelin  For rescue acute intrastent aggregation  With complete clearance. Also gave loading dose of  plavix 600mg  and 325 mg of aspirin via oro gastric tube.

## 2017-10-09 NOTE — Anesthesia Postprocedure Evaluation (Signed)
Anesthesia Post Note  Patient: Raymond Lowery  Procedure(s) Performed: RADIOLOGY WITH ANESTHESIA (N/A )     Patient location during evaluation: SICU Anesthesia Type: General Level of consciousness: sedated Pain management: pain level controlled Vital Signs Assessment: post-procedure vital signs reviewed and stable Respiratory status: patient remains intubated per anesthesia plan Cardiovascular status: stable Postop Assessment: no apparent nausea or vomiting Anesthetic complications: no    Last Vitals:  Vitals:   10/09/17 2300 10/09/17 2315  BP: (!) 113/93   Pulse: 69 81  Resp: 20 (!) 23  Temp: 36.4 C   SpO2: 99% 99%    Last Pain:  Vitals:   10/09/17 2300  TempSrc: Axillary  PainSc:                  Yaslene Lindamood,W. EDMOND

## 2017-10-09 NOTE — ED Provider Notes (Signed)
[facility] Provider Note   CSN: [id number] Arrival date & time: 10/09/17  1743     History   Chief Complaint Chief Complaint  Patient presents with  . Code Stroke    HPI Raymond Lowery is a 61 y.o. male.  History of Present Illness  Patient Identification Raymond Lowery is a 61 y.o. male. Chief Complaint  Code Stroke   Patient presents for evaluation of weakness of left face, left upper leg(s) or left lower leg(s). Onset of symptoms was sudden, starting about 2 hours ago. Patient had transient ataxia, drooling, and weakness around 7:30 AM  Lasting about 3 minutes. Symptoms are currently of moderate severity. Symptoms have lasted 2 hours. Stroke risk factors include smoking. Prior stroke history: none. . Patient denies chest pain, palpitations, seizures and shortness of breath.         History reviewed. No pertinent past medical history.  There are no active problems to display for this patient.   History reviewed. No pertinent surgical history.     Home Medications    Prior to Admission medications   Not on File    Family History No family history on file.  Social History Social History   Tobacco Use  . Smoking status: Not on file  Substance Use Topics  . Alcohol use: Not on file  . Drug use: Not on file     Allergies   Patient has no allergy information on record.   Review of Systems Review of Systems  HENT: Negative.   Eyes: Negative.   Respiratory: Negative.   Cardiovascular: Negative.   Endocrine: Negative.   Genitourinary: Negative.   Musculoskeletal: Positive for gait problem.  Allergic/Immunologic: Negative.   Neurological: Positive for facial asymmetry, speech difficulty and weakness.  Hematological: Negative.   Psychiatric/Behavioral: Negative.   All other systems reviewed and are negative.    Physical Exam Updated Vital Signs BP 133/69 (BP Location: Right Arm)   Pulse 71    Temp 98.6 F (37 C) (Oral)   Resp 16   Wt 74.9 kg (165 lb 2 oz)   SpO2 94%   Physical Exam  Constitutional: He is oriented to person, place, and time. He appears well-developed and well-nourished. No distress.  HENT:  Head: Normocephalic and atraumatic.  Eyes: Conjunctivae are normal. No scleral icterus.  Neck: Normal range of motion. Neck supple.  Cardiovascular: Normal rate, regular rhythm and normal heart sounds.  Pulmonary/Chest: Effort normal and breath sounds normal. No respiratory distress.  Abdominal: Soft. There is no tenderness.  Musculoskeletal: He exhibits no edema.  Neurological: He is alert and oriented to person, place, and time. GCS eye subscore is 4. GCS verbal subscore is 5. GCS motor subscore is 6.  Speech is clear and goal oriented, follows commands Major Cranial nerves without deficit except for left facial flattening Left upper and lower extremity weakness Sensation normal to light and sharp touch   Skin: Skin is warm and dry. He is not diaphoretic.  Psychiatric: His behavior is normal.  Nursing note and vitals reviewed.    ED Treatments / Results  Labs (all labs ordered are listed, but only abnormal results are displayed) Labs Reviewed  COMPREHENSIVE METABOLIC PANEL - Abnormal; Notable for the following components:      Result Value   CO2 21 (*)    All other components within normal limits  I-STAT CHEM 8, ED - Abnormal; Notable for the following components:   Calcium, Ion 1.09 (*)  All other components within normal limits  PROTIME-INR  APTT  CBC  DIFFERENTIAL  I-STAT TROPONIN, ED  CBG MONITORING, ED    EKG  EKG Interpretation  Date/Time:  Monday October 09 2017 17:58:38 EST Ventricular Rate:  72 PR Interval:  152 QRS Duration: 96 QT Interval:  408 QTC Calculation: 446 R Axis:   84 Text Interpretation:  Normal sinus rhythm Normal ECG Confirmed by Raeford RazorKohut, Stephen [phone]([id number]) on 10/09/2017 6:26:14 PM       Radiology Ct Head Code  Stroke Wo Contrast  Result Date: 10/09/2017 CLINICAL DATA:  Code stroke.  Left-sided weakness and facial droop EXAM: CT HEAD WITHOUT CONTRAST TECHNIQUE: Contiguous axial images were obtained from the base of the skull through the vertex without intravenous contrast. COMPARISON:  None. FINDINGS: Brain: There is hypoattenuation within the right insula with loss of the normal insular ribbon. Gray-white differentiation is otherwise preserved. There is no acute hemorrhage. No mass lesion or midline shift. No extra-axial collection. Vascular: There is a hyperdense right middle cerebral artery. Skull: Normal skullbase and calvarium. Sinuses/Orbits: There is near complete opacification of the atelectatic right maxillary sinus. The other paranasal sinuses are clear. No mastoid or middle ear effusion. The orbits are normal. Other: None ASPECTS (Alberta Stroke Program Early CT Score) - Ganglionic level infarction (caudate, lentiform nuclei, internal capsule, insula, M1-M3 cortex): 6 - Supraganglionic infarction (M4-M6 cortex): 3 Total score (0-10 with 10 being normal): 9 IMPRESSION: 1. No acute hemorrhage. 2. Hypoattenuation within the right insula with hyperdense appearance of the right middle cerebral artery, consistent with acute infarct. 3. ASPECTS is 9. These results were called by telephone at the time of interpretation on 10/09/2017 at 5:58 Pm to Dr. Ritta SlotMCNEILL KIRKPATRICK , who verbally acknowledged these results. Electronically Signed   By: Deatra RobinsonKevin  Herman M.D.   On: 10/09/2017 18:07    Procedures .Critical Care Performed by: Arthor CaptainHarris, Adonnis Salceda, PA-C Authorized by: Arthor CaptainHarris, Shreeya Recendiz, PA-C   Critical care provider statement:    Critical care time (minutes):  30   Critical care was necessary to treat or prevent imminent or life-threatening deterioration of the following conditions:  CNS failure or compromise   Critical care was time spent personally by me on the following activities:  Development of treatment plan  with patient or surrogate, discussions with consultants, evaluation of patient's response to treatment, examination of patient, interpretation of cardiac output measurements, re-evaluation of patient's condition, ordering and review of laboratory studies, ordering and review of radiographic studies and ordering and performing treatments and interventions   (including critical care time)  Medications Ordered in ED Medications  iopamidol (ISOVUE-370) 76 % injection (100 mLs  Contrast Given 10/09/17 1800)     Initial Impression / Assessment and Plan / ED Course  I have reviewed the triage vital signs and the nursing notes.  Pertinent labs & imaging results that were available during my care of the patient were reviewed by me and considered in my medical decision making (see chart for details).     Patient seen as code stroke. His EKG is normal Patient is going to the IR suite emergently. Seen in shared visit with Dr. Amada JupiterKirkpatrick.  Final Clinical Impressions(s) / ED Diagnoses   Final diagnoses:  Stroke (cerebrum) [facility](HCC)    ED Discharge Orders    None       Arthor CaptainHarris, Ruthetta Koopmann, PA-C 10/09/17 1842    Raeford RazorKohut, Stephen, MD 10/10/17 1929

## 2017-10-09 NOTE — Sedation Documentation (Signed)
Groin and pulses assess at bedside with pts nurse, Asher MuirJamie, RN. See flowsheet.

## 2017-10-09 NOTE — Sedation Documentation (Addendum)
Aggrastat 1mg /IA being infused over 10 mins at 120cc/hr via pump through guide sheath just proximal to Carotid stent.

## 2017-10-09 NOTE — Anesthesia Procedure Notes (Signed)
Arterial Line Insertion Start/End12/07/2017 6:45 PM, 10/09/2017 6:56 PM Performed by: Claudina LickMahony, Rebecca D, CRNA, CRNA  Preanesthetic checklist: patient identified, IV checked, risks and benefits discussed, surgical consent, monitors and equipment checked, pre-op evaluation and timeout performed radial was placed Catheter size: 20 G Hand hygiene performed  and maximum sterile barriers used   Attempts: 1 Procedure performed without using ultrasound guided technique. Ultrasound Notes:anatomy identified and needle tip was noted to be adjacent to the nerve/plexus identified Following insertion, dressing applied and Biopatch. Patient tolerated the procedure well with no immediate complications.

## 2017-10-09 NOTE — Progress Notes (Signed)
Patient ID: Raymond Lowery, male   DOB: 1956-10-17, 61 y.o.   MRN: [id number] INR . 61 yr old RT H male  Worsening Lt sided weakness at approx 415 pm. Transient symptoms at about 815 am . Premorbid  Modified rankin score 0.  CT Brain ASPECTS  9. CTA RT ICA and RT MCA ooclusion. CTP CBF < 30 ml vol 17ml. Tmax > 60 s vol 158 ml. Mismatch vol 141ml Mismatch ratio 9.3. Option of endovascular revascularization  to prevent further neurologic injury to the brain discussed with spouse. Procedure,risks,benefits alternatives all reviewed.Risks of ICH 10  To 15 % ,worsening neurologic condition ,vent dependency,death and inability to revascularize all discussed with spouse .Potential increased risk of ICH with use of dual antiplatelets  In case of stent use also discussed. Questions answered to spouses satisfaction. Marland Kitchen.Spouse expressed complete understanding , and agreed to proceed with revascularization of occluded RT MCA and RT ICA.Marland Kitchen.i Informed witnessed consent obtained.Marland Kitchen. S.Tani Virgo MD

## 2017-10-10 ENCOUNTER — Inpatient Hospital Stay (HOSPITAL_COMMUNITY): Payer: 59

## 2017-10-10 ENCOUNTER — Other Ambulatory Visit: Payer: Self-pay

## 2017-10-10 ENCOUNTER — Encounter (HOSPITAL_COMMUNITY): Payer: Self-pay | Admitting: Emergency Medicine

## 2017-10-10 DIAGNOSIS — I6789 Other cerebrovascular disease: Secondary | ICD-10-CM

## 2017-10-10 DIAGNOSIS — I63311 Cerebral infarction due to thrombosis of right middle cerebral artery: Secondary | ICD-10-CM

## 2017-10-10 DIAGNOSIS — J9601 Acute respiratory failure with hypoxia: Secondary | ICD-10-CM

## 2017-10-10 LAB — CBC WITH DIFFERENTIAL/PLATELET
Basophils Absolute: 0 10*3/uL (ref 0.0–0.1)
Basophils Relative: 0 %
EOS ABS: 0.1 10*3/uL (ref 0.0–0.7)
Eosinophils Relative: 1 %
HEMATOCRIT: 43.2 % (ref 39.0–52.0)
HEMOGLOBIN: 14.5 g/dL (ref 13.0–17.0)
LYMPHS ABS: 2.1 10*3/uL (ref 0.7–4.0)
LYMPHS PCT: 17 %
MCH: 31.8 pg (ref 26.0–34.0)
MCHC: 33.6 g/dL (ref 30.0–36.0)
MCV: 94.7 fL (ref 78.0–100.0)
MONOS PCT: 7 %
Monocytes Absolute: 0.8 10*3/uL (ref 0.1–1.0)
NEUTROS ABS: 9.4 10*3/uL — AB (ref 1.7–7.7)
NEUTROS PCT: 75 %
PLATELETS: 194 10*3/uL (ref 150–400)
RBC: 4.56 MIL/uL (ref 4.22–5.81)
RDW: 13.8 % (ref 11.5–15.5)
WBC: 12.4 10*3/uL — AB (ref 4.0–10.5)

## 2017-10-10 LAB — BLOOD GAS, ARTERIAL
ACID-BASE DEFICIT: 2.9 mmol/L — AB (ref 0.0–2.0)
BICARBONATE: 21.9 mmol/L (ref 20.0–28.0)
Drawn by: 51702
FIO2: 100
LHR: 14 {breaths}/min
O2 Saturation: 99.4 %
PEEP: 5 cmH2O
PO2 ART: 337 mmHg — AB (ref 83.0–108.0)
Patient temperature: 98.6
VT: 580 mL
pCO2 arterial: 41.3 mmHg (ref 32.0–48.0)
pH, Arterial: 7.344 — ABNORMAL LOW (ref 7.350–7.450)

## 2017-10-10 LAB — LIPID PANEL
CHOL/HDL RATIO: 4.5 ratio
Cholesterol: 121 mg/dL (ref 0–200)
HDL: 27 mg/dL — AB (ref >40)
LDL Cholesterol: 69 mg/dL (ref 0–99)
TRIGLYCERIDES: 123 mg/dL (ref <150)
VLDL: 25 mg/dL (ref 0–40)

## 2017-10-10 LAB — HEMOGLOBIN A1C
Hgb A1c MFr Bld: 5.3 % (ref 4.8–5.6)
MEAN PLASMA GLUCOSE: 105.41 mg/dL

## 2017-10-10 LAB — HIV ANTIBODY (ROUTINE TESTING W REFLEX): HIV SCREEN 4TH GENERATION: NONREACTIVE

## 2017-10-10 LAB — BASIC METABOLIC PANEL
ANION GAP: 6 (ref 5–15)
BUN: 6 mg/dL (ref 6–20)
CALCIUM: 8 mg/dL — AB (ref 8.9–10.3)
CO2: 21 mmol/L — ABNORMAL LOW (ref 22–32)
Chloride: 108 mmol/L (ref 101–111)
Creatinine, Ser: 0.69 mg/dL (ref 0.61–1.24)
Glucose, Bld: 100 mg/dL — ABNORMAL HIGH (ref 65–99)
POTASSIUM: 4.1 mmol/L (ref 3.5–5.1)
SODIUM: 135 mmol/L (ref 135–145)

## 2017-10-10 LAB — GLUCOSE, CAPILLARY: GLUCOSE-CAPILLARY: 114 mg/dL — AB (ref 65–99)

## 2017-10-10 LAB — ECHOCARDIOGRAM COMPLETE
HEIGHTINCHES: 70 in
WEIGHTICAEL: 2589.08 [oz_av]

## 2017-10-10 LAB — TRIGLYCERIDES: TRIGLYCERIDES: 122 mg/dL (ref <150)

## 2017-10-10 LAB — PLATELET INHIBITION P2Y12: Platelet Function  P2Y12: 189 [PRU] — ABNORMAL LOW (ref 194–418)

## 2017-10-10 LAB — MRSA PCR SCREENING: MRSA BY PCR: NEGATIVE

## 2017-10-10 LAB — POCT ACTIVATED CLOTTING TIME: ACTIVATED CLOTTING TIME: 169 s

## 2017-10-10 MED ORDER — CHLORHEXIDINE GLUCONATE 0.12% ORAL RINSE (MEDLINE KIT)
15.0000 mL | Freq: Two times a day (BID) | OROMUCOSAL | Status: DC
  Administered 2017-10-10 (×2): 15 mL via OROMUCOSAL

## 2017-10-10 MED ORDER — ATORVASTATIN CALCIUM 10 MG PO TABS
10.0000 mg | ORAL_TABLET | Freq: Every day | ORAL | Status: DC
  Administered 2017-10-10: 10 mg via ORAL
  Filled 2017-10-10: qty 1

## 2017-10-10 MED ORDER — ORAL CARE MOUTH RINSE
15.0000 mL | Freq: Two times a day (BID) | OROMUCOSAL | Status: DC
  Administered 2017-10-10: 15 mL via OROMUCOSAL

## 2017-10-10 MED ORDER — CLOPIDOGREL BISULFATE 75 MG PO TABS
75.0000 mg | ORAL_TABLET | Freq: Once | ORAL | Status: AC
  Administered 2017-10-10: 75 mg via ORAL
  Filled 2017-10-10: qty 1

## 2017-10-10 MED ORDER — ORAL CARE MOUTH RINSE
15.0000 mL | OROMUCOSAL | Status: DC
  Administered 2017-10-10 (×6): 15 mL via OROMUCOSAL

## 2017-10-10 NOTE — Procedures (Signed)
Extubation Procedure Note  Patient Details:   Name: Raymond Lowery DOB: Mar 03, 1956 MRN: [id number]   Airway Documentation:     Evaluation  O2 sats: stable throughout Complications: No apparent complications Patient did tolerate procedure well. Bilateral Breath Sounds: Clear   Yes   Patient extubated to 4lnc. Vital signs stable at this time. No complications. Patient is tolerating well. Family and RN at bedside. RT will continue to monitor.  Ave Filterdkins, Tkai Large Williams 10/10/2017, 3:58 PM

## 2017-10-10 NOTE — Progress Notes (Signed)
Right 8 fr sheath removal, no hematoma present before sheath pull, held manual pressure for 25 minutes with a V pad, no complications, verified site and distal pulses with Biochemist, clinicalBrooke RN.  Lynann BeaverJames Shima Compere RT R, VI Cory Sandling RT R

## 2017-10-10 NOTE — Progress Notes (Signed)
OT Cancellation    10/10/17 1100  OT Visit Information  Last OT Received On 10/10/17  Reason Eval/Treat Not Completed Patient not medically ready. Pt remains intubated and just had a sheath removed. Will return for OT evaluation as schedule allows and pt medically ready. Thank you.   Chelesa Weingartner MSOT, OTR/L Acute Rehab Pager: [phone] Office: [phone]

## 2017-10-10 NOTE — Progress Notes (Signed)
PULMONARY / CRITICAL CARE MEDICINE   Name: Raymond Lowery MRN: [id number] DOB: 06/11/1956    ADMISSION DATE:  10/09/2017   CHIEF COMPLAINT: Left hemiplegia  HISTORY OF PRESENT ILLNESS:   This is a 61 year old who presented with left hemiplegia and was found on CTA to have a right MCA and ICA occlusion.  He underwent thrombectomy and remains intubated and mechanically ventilated in the intensive care unit he is deeply sedated the time of my examination and unable to interact.  PAST MEDICAL HISTORY :  He  has no past medical history on file.  PAST SURGICAL HISTORY: He  has a past surgical history that includes Radiology with anesthesia (N/A, 10/09/2017).  Not on File  No current facility-administered medications on file prior to encounter.    No current outpatient medications on file prior to encounter.    FAMILY HISTORY:  His has no family status information on file.    SOCIAL HISTORY: He  reports that he has been smoking cigarettes.  He has quit using smokeless tobacco.  REVIEW OF SYSTEMS:   Unobtainable  SUBJECTIVE:  Unobtainable  VITAL SIGNS: BP 114/64   Pulse (!) 56   Temp 98 F (36.7 C) (Axillary)   Resp 14   Ht 5\' 10"  (1.778 m)   Wt 161 lb 13.1 oz (73.4 kg)   SpO2 100%   BMI 23.22 kg/m   HEMODYNAMICS:    VENTILATOR SETTINGS: Vent Mode: PRVC FiO2 (%):  [40 %-100 %] 40 % Set Rate:  [14 bmp] 14 bmp Vt Set:  [580 mL] 580 mL PEEP:  [5 cmH20] 5 cmH20 Plateau Pressure:  [16 cmH20-17 cmH20] 16 cmH20  INTAKE / OUTPUT: I/O last 3 completed shifts: In: 1394.8 [I.V.:1394.8] Out: 1480 [Urine:1405; Blood:75]  PHYSICAL EXAMINATION: General: Examination is performed just after he received a fentanyl bolus for sheath removal and he is also on a propofol infusion. Neuro: Pupils are equal and reactive, he is not responsive to voice at present. Cardiovascular: 1 and S2 are regular without murmur rub or gallop Lungs: Respirations are unlabored there is  symmetric air movement no wheezes, and very few rhonchi. Abdomen: The abdomen is flat and soft without any organomegaly masses tenderness guarding or rebound.  There is no groin hematoma. Musculoskeletal: 2+ symmetric dorsalis pedis pulses, the feet are pink and warm   LABS:  BMET Recent Labs  Lab 10/09/17 1749 10/09/17 1756 10/10/17 0337  NA 137 138 135  K 3.8 3.7 4.1  CL 107 105 108  CO2 21*  --  21*  BUN 9 9 6   CREATININE 0.78 0.70 0.69  GLUCOSE 95 96 100*    Electrolytes Recent Labs  Lab 10/09/17 1749 10/10/17 0337  CALCIUM 9.0 8.0*    CBC Recent Labs  Lab 10/09/17 1749 10/09/17 1756 10/10/17 0337  WBC 9.9  --  12.4*  HGB 16.6 17.0 14.5  HCT 47.5 50.0 43.2  PLT 208  --  194    Coag's Recent Labs  Lab 10/09/17 1749  APTT 32  INR 0.95    Sepsis Markers No results for input(s): LATICACIDVEN, PROCALCITON, O2SATVEN in the last 168 hours.  ABG Recent Labs  Lab 10/10/17 0010  PHART 7.344*  PCO2ART 41.3  PO2ART 337*    Liver Enzymes Recent Labs  Lab 10/09/17 1749  AST 25  ALT 29  ALKPHOS 62  BILITOT 0.8  ALBUMIN 3.9    Cardiac Enzymes No results for input(s): TROPONINI, PROBNP in the last 168 hours.  Glucose Recent  Labs  Lab 10/09/17 1747 10/10/17 0806  GLUCAP 90 114*    Imaging Ct Angio Head W Or Wo Contrast  Result Date: 10/09/2017 CLINICAL DATA:  Left facial droop and left-sided weakness. EXAM: CT ANGIOGRAPHY HEAD AND NECK CT PERFUSION BRAIN TECHNIQUE: Multidetector CT imaging of the head and neck was performed using the standard protocol during bolus administration of intravenous contrast. Multiplanar CT image reconstructions and MIPs were obtained to evaluate the vascular anatomy. Carotid stenosis measurements (when applicable) are obtained utilizing NASCET criteria, using the distal internal carotid diameter as the denominator. Multiphase CT imaging of the brain was performed following IV bolus contrast injection. Subsequent  parametric perfusion maps were calculated using RAPID software. CONTRAST:  ISOVUE-370 IOPAMIDOL (ISOVUE-370) INJECTION 76% COMPARISON:  Head CT same day FINDINGS: CTA NECK FINDINGS Aortic arch: There is minimal calcific atherosclerosis of the aortic arch. There is no aneurysm, dissection or hemodynamically significant stenosis of the visualized ascending aorta and aortic arch. Normal variant aortic arch branching pattern with the brachiocephalic and left common carotid arteries sharing a common origin. Mild atherosclerotic plaque in the left subclavian artery origin. No hemodynamically significant subclavian artery stenosis. Right carotid system: Right common carotid artery is widely patent. There is predominantly noncalcified plaque at the carotid bifurcation with complete occlusion of the right internal carotid artery origin. There is critical stenosis of the proximal right internal carotid artery with a string sign mid extends approximately to the skullbase. There is complete occlusion of the skullbase segments of the right ICA with short segment reconstitution of the communicating segment. Left carotid system: The left common carotid origin is widely patent. There is no common carotid or internal carotid artery dissection or aneurysm. Atherosclerotic calcification at the carotid bifurcation without hemodynamically significant stenosis. Vertebral arteries: The vertebral system is codominant. Both vertebral artery origins are normal. Both vertebral arteries are normal to their confluence with the basilar artery. Skeleton: There is no bony spinal canal stenosis. No lytic or blastic lesions. Other neck: The nasopharynx is clear. The oropharynx and hypopharynx are normal. The epiglottis is normal. The supraglottic larynx, glottis and subglottic larynx are normal. No retropharyngeal collection. The parapharyngeal spaces are preserved. The parotid and submandibular glands are normal. No sialolithiasis or salivary  ductal dilatation. The thyroid gland is normal. There is no cervical lymphadenopathy. Upper chest: No pneumothorax or pleural effusion. No nodules or masses. Review of the MIP images confirms the above findings CTA HEAD FINDINGS Anterior circulation: --Intracranial internal carotid arteries: There is short segment opacification of the communicating portion of the right internal carotid artery, which is otherwise occluded. --Anterior cerebral arteries: Limited opacification of the right A1 segment. Otherwise normal anterior cerebral arteries. --Middle cerebral arteries: The right middle cerebral artery is occluded at its origin with thrombus that measures approximately 2.2 cm. There is severely attenuated enhancement of the distal MCA branches. Poor collateralization within the right MCA territory. The left middle cerebral artery is normal. --Posterior communicating arteries: There is a small right posterior communicating artery. None is visualized on the left. Posterior circulation: --Posterior cerebral arteries: Normal. --Superior cerebellar arteries: Normal. --Basilar artery: Normal. --Anterior inferior cerebellar arteries: Normal. --Posterior inferior cerebellar arteries: Normal. Venous sinuses: As permitted by contrast timing, patent. Anatomic variants: None Delayed phase: Not performed. Review of the MIP images confirms the above findings CT Brain Perfusion Findings: CBF (<30%) Volume: 17mL Perfusion (Tmax>6.0s) volume: Mismatch Volume: Infarction Location:Right insula and adjacent right frontal white matter. IMPRESSION: 1. Emergent large vessel occlusion of the  right middle cerebral artery at its origin, in addition to long segment occlusion/critical stenosis of the right internal carotid artery beginning at the carotid bifurcation with a CT angiographic string sign extending to the skullbase, where the artery is completely occluded. Enhancement at the terminal aspect of the right internal carotid  artery the collateral flow from the anterior communicating and right posterior communicating arteries. 2. Expansile thrombus within the M1 segment of the right middle cerebral artery measures approximately 2 cm. Poor collateralization of the right MCA territory. 3. With perfusion findings indicating 17 mL infarction volume within the right insula and adjacent centrum semiovale, with 141 mL of ischemic penumbra occupying the remainder of the right middle cerebral artery territory. 4. No hemodynamically significant stenosis of the left internal carotid artery or vertebral arteries. Critical Value/emergent results were called by telephone at the time of interpretation on 10/09/2017 at 6:30 pm to Dr. Ritta SlotMCNEILL KIRKPATRICK , who verbally acknowledged these results. Electronically Signed   By: Deatra RobinsonKevin  Herman M.D.   On: 10/09/2017 18:59   Ct Angio Neck W Or Wo Contrast  Result Date: 10/09/2017 CLINICAL DATA:  Left facial droop and left-sided weakness. EXAM: CT ANGIOGRAPHY HEAD AND NECK CT PERFUSION BRAIN TECHNIQUE: Multidetector CT imaging of the head and neck was performed using the standard protocol during bolus administration of intravenous contrast. Multiplanar CT image reconstructions and MIPs were obtained to evaluate the vascular anatomy. Carotid stenosis measurements (when applicable) are obtained utilizing NASCET criteria, using the distal internal carotid diameter as the denominator. Multiphase CT imaging of the brain was performed following IV bolus contrast injection. Subsequent parametric perfusion maps were calculated using RAPID software. CONTRAST:  100mL ISOVUE-370 IOPAMIDOL (ISOVUE-370) INJECTION 76% COMPARISON:  Head CT same day FINDINGS: CTA NECK FINDINGS Aortic arch: There is minimal calcific atherosclerosis of the aortic arch. There is no aneurysm, dissection or hemodynamically significant stenosis of the visualized ascending aorta and aortic arch. Normal variant aortic arch branching pattern with  the brachiocephalic and left common carotid arteries sharing a common origin. Mild atherosclerotic plaque in the left subclavian artery origin. No hemodynamically significant subclavian artery stenosis. Right carotid system: Right common carotid artery is widely patent. There is predominantly noncalcified plaque at the carotid bifurcation with complete occlusion of the right internal carotid artery origin. There is critical stenosis of the proximal right internal carotid artery with a string sign mid extends approximately to the skullbase. There is complete occlusion of the skullbase segments of the right ICA with short segment reconstitution of the communicating segment. Left carotid system: The left common carotid origin is widely patent. There is no common carotid or internal carotid artery dissection or aneurysm. Atherosclerotic calcification at the carotid bifurcation without hemodynamically significant stenosis. Vertebral arteries: The vertebral system is codominant. Both vertebral artery origins are normal. Both vertebral arteries are normal to their confluence with the basilar artery. Skeleton: There is no bony spinal canal stenosis. No lytic or blastic lesions. Other neck: The nasopharynx is clear. The oropharynx and hypopharynx are normal. The epiglottis is normal. The supraglottic larynx, glottis and subglottic larynx are normal. No retropharyngeal collection. The parapharyngeal spaces are preserved. The parotid and submandibular glands are normal. No sialolithiasis or salivary ductal dilatation. The thyroid gland is normal. There is no cervical lymphadenopathy. Upper chest: No pneumothorax or pleural effusion. No nodules or masses. Review of the MIP images confirms the above findings CTA HEAD FINDINGS Anterior circulation: --Intracranial internal carotid arteries: There is short segment opacification of the communicating portion of the  right internal carotid artery, which is otherwise occluded.  --Anterior cerebral arteries: Limited opacification of the right A1 segment. Otherwise normal anterior cerebral arteries. --Middle cerebral arteries: The right middle cerebral artery is occluded at its origin with thrombus that measures approximately 2.2 cm. There is severely attenuated enhancement of the distal MCA branches. Poor collateralization within the right MCA territory. The left middle cerebral artery is normal. --Posterior communicating arteries: There is a small right posterior communicating artery. None is visualized on the left. Posterior circulation: --Posterior cerebral arteries: Normal. --Superior cerebellar arteries: Normal. --Basilar artery: Normal. --Anterior inferior cerebellar arteries: Normal. --Posterior inferior cerebellar arteries: Normal. Venous sinuses: As permitted by contrast timing, patent. Anatomic variants: None Delayed phase: Not performed. Review of the MIP images confirms the above findings CT Brain Perfusion Findings: CBF (<30%) Volume: 17mL Perfusion (Tmax>6.0s) volume: Mismatch Volume: Infarction Location:Right insula and adjacent right frontal white matter. IMPRESSION: 1. Emergent large vessel occlusion of the right middle cerebral artery at its origin, in addition to long segment occlusion/critical stenosis of the right internal carotid artery beginning at the carotid bifurcation with a CT angiographic string sign extending to the skullbase, where the artery is completely occluded. Enhancement at the terminal aspect of the right internal carotid artery the collateral flow from the anterior communicating and right posterior communicating arteries. 2. Expansile thrombus within the M1 segment of the right middle cerebral artery measures approximately 2 cm. Poor collateralization of the right MCA territory. 3. With perfusion findings indicating 17 mL infarction volume within the right insula and adjacent centrum semiovale, with 141 mL of ischemic penumbra occupying  the remainder of the right middle cerebral artery territory. 4. No hemodynamically significant stenosis of the left internal carotid artery or vertebral arteries. Critical Value/emergent results were called by telephone at the time of interpretation on 10/09/2017 at 6:30 pm to Dr. Ritta Slot , who verbally acknowledged these results. Electronically Signed   By: Deatra Robinson M.D.   On: 10/09/2017 18:59   Ct Cerebral Perfusion W Contrast  Result Date: 10/09/2017 CLINICAL DATA:  Left facial droop and left-sided weakness. EXAM: CT ANGIOGRAPHY HEAD AND NECK CT PERFUSION BRAIN TECHNIQUE: Multidetector CT imaging of the head and neck was performed using the standard protocol during bolus administration of intravenous contrast. Multiplanar CT image reconstructions and MIPs were obtained to evaluate the vascular anatomy. Carotid stenosis measurements (when applicable) are obtained utilizing NASCET criteria, using the distal internal carotid diameter as the denominator. Multiphase CT imaging of the brain was performed following IV bolus contrast injection. Subsequent parametric perfusion maps were calculated using RAPID software. CONTRAST:  ISOVUE-370 IOPAMIDOL (ISOVUE-370) INJECTION 76% COMPARISON:  Head CT same day FINDINGS: CTA NECK FINDINGS Aortic arch: There is minimal calcific atherosclerosis of the aortic arch. There is no aneurysm, dissection or hemodynamically significant stenosis of the visualized ascending aorta and aortic arch. Normal variant aortic arch branching pattern with the brachiocephalic and left common carotid arteries sharing a common origin. Mild atherosclerotic plaque in the left subclavian artery origin. No hemodynamically significant subclavian artery stenosis. Right carotid system: Right common carotid artery is widely patent. There is predominantly noncalcified plaque at the carotid bifurcation with complete occlusion of the right internal carotid artery origin. There is  critical stenosis of the proximal right internal carotid artery with a string sign mid extends approximately to the skullbase. There is complete occlusion of the skullbase segments of the right ICA with short segment reconstitution of the communicating segment. Left carotid system: The left  common carotid origin is widely patent. There is no common carotid or internal carotid artery dissection or aneurysm. Atherosclerotic calcification at the carotid bifurcation without hemodynamically significant stenosis. Vertebral arteries: The vertebral system is codominant. Both vertebral artery origins are normal. Both vertebral arteries are normal to their confluence with the basilar artery. Skeleton: There is no bony spinal canal stenosis. No lytic or blastic lesions. Other neck: The nasopharynx is clear. The oropharynx and hypopharynx are normal. The epiglottis is normal. The supraglottic larynx, glottis and subglottic larynx are normal. No retropharyngeal collection. The parapharyngeal spaces are preserved. The parotid and submandibular glands are normal. No sialolithiasis or salivary ductal dilatation. The thyroid gland is normal. There is no cervical lymphadenopathy. Upper chest: No pneumothorax or pleural effusion. No nodules or masses. Review of the MIP images confirms the above findings CTA HEAD FINDINGS Anterior circulation: --Intracranial internal carotid arteries: There is short segment opacification of the communicating portion of the right internal carotid artery, which is otherwise occluded. --Anterior cerebral arteries: Limited opacification of the right A1 segment. Otherwise normal anterior cerebral arteries. --Middle cerebral arteries: The right middle cerebral artery is occluded at its origin with thrombus that measures approximately 2.2 cm. There is severely attenuated enhancement of the distal MCA branches. Poor collateralization within the right MCA territory. The left middle cerebral artery is normal.  --Posterior communicating arteries: There is a small right posterior communicating artery. None is visualized on the left. Posterior circulation: --Posterior cerebral arteries: Normal. --Superior cerebellar arteries: Normal. --Basilar artery: Normal. --Anterior inferior cerebellar arteries: Normal. --Posterior inferior cerebellar arteries: Normal. Venous sinuses: As permitted by contrast timing, patent. Anatomic variants: None Delayed phase: Not performed. Review of the MIP images confirms the above findings CT Brain Perfusion Findings: CBF (<30%) Volume: 17mL Perfusion (Tmax>6.0s) volume: Mismatch Volume: Infarction Location:Right insula and adjacent right frontal white matter. IMPRESSION: 1. Emergent large vessel occlusion of the right middle cerebral artery at its origin, in addition to long segment occlusion/critical stenosis of the right internal carotid artery beginning at the carotid bifurcation with a CT angiographic string sign extending to the skullbase, where the artery is completely occluded. Enhancement at the terminal aspect of the right internal carotid artery the collateral flow from the anterior communicating and right posterior communicating arteries. 2. Expansile thrombus within the M1 segment of the right middle cerebral artery measures approximately 2 cm. Poor collateralization of the right MCA territory. 3. With perfusion findings indicating 17 mL infarction volume within the right insula and adjacent centrum semiovale, with 141 mL of ischemic penumbra occupying the remainder of the right middle cerebral artery territory. 4. No hemodynamically significant stenosis of the left internal carotid artery or vertebral arteries. Critical Value/emergent results were called by telephone at the time of interpretation on 10/09/2017 at 6:30 pm to Dr. Ritta Slot , who verbally acknowledged these results. Electronically Signed   By: Deatra Robinson M.D.   On: 10/09/2017 18:59   Portable  Chest Xray  Result Date: 10/10/2017 CLINICAL DATA:  Acute hypoxemic respiratory failure. EXAM: PORTABLE CHEST 1 VIEW COMPARISON:  10/09/2017 FINDINGS: The endotracheal tube is unchanged, position well above the carina. The enteric tube courses into the left upper abdomen with tip not imaged. The cardiac silhouette is normal in size. The lungs are hyperinflated with mild interstitial coarsening, likely chronic. Patchy bibasilar airspace opacities have not significantly changed. No sizable pleural effusion or pneumothorax is identified. IMPRESSION: Unchanged bibasilar airspace opacities which may reflect aspiration or infectious pneumonitis. Electronically Signed  By: Sebastian Ache M.D.   On: 10/10/2017 07:20   Dg Chest Port 1 View  Result Date: 10/09/2017 CLINICAL DATA:  Intubation and gastric tube placement for stroke. EXAM: PORTABLE CHEST 1 VIEW COMPARISON:  10/01/2012 FINDINGS: Patchy bibasilar airspace opacities are noted suspicious for aspiration. The lungs are otherwise mildly hyperinflated. Heart size is top-normal. There is minimal aortic atherosclerosis. No acute osseous abnormality. There is mild interstitial edema. The tip of an endotracheal tube is in satisfactory position approximately 6.5 cm above the carina. The side-port in a gastric tube is approximately 1.9 cm proximal to the GE junction and further advancement of the gastric tube by at least 4-5 cm is recommended. Chronic left seventh rib fracture. IMPRESSION: 1. Satisfactory endotracheal tube position. 2. Further advancement of the patient's gastric tube by at least 4-5 cm is recommended as the side-port is approximately 1.9 cm above the GE junction. 3. Mild interstitial edema with patchy bilateral lower lobe airspace opacities suspicious for aspiration pneumonia. Electronically Signed   By: Tollie Eth M.D.   On: 10/09/2017 23:40   Ct Head Code Stroke Wo Contrast  Result Date: 10/09/2017 CLINICAL DATA:  Code stroke.  Left-sided  weakness and facial droop EXAM: CT HEAD WITHOUT CONTRAST TECHNIQUE: Contiguous axial images were obtained from the base of the skull through the vertex without intravenous contrast. COMPARISON:  None. FINDINGS: Brain: There is hypoattenuation within the right insula with loss of the normal insular ribbon. Brigida Scotti-white differentiation is otherwise preserved. There is no acute hemorrhage. No mass lesion or midline shift. No extra-axial collection. Vascular: There is a hyperdense right middle cerebral artery. Skull: Normal skullbase and calvarium. Sinuses/Orbits: There is near complete opacification of the atelectatic right maxillary sinus. The other paranasal sinuses are clear. No mastoid or middle ear effusion. The orbits are normal. Other: None ASPECTS (Alberta Stroke Program Early CT Score) - Ganglionic level infarction (caudate, lentiform nuclei, internal capsule, insula, M1-M3 cortex): 6 - Supraganglionic infarction (M4-M6 cortex): 3 Total score (0-10 with 10 being normal): 9 IMPRESSION: 1. No acute hemorrhage. 2. Hypoattenuation within the right insula with hyperdense appearance of the right middle cerebral artery, consistent with acute infarct. 3. ASPECTS is 9. These results were called by telephone at the time of interpretation on 10/09/2017 at 5:58 Pm to Dr. Ritta Slot , who verbally acknowledged these results. Electronically Signed   By: Deatra Robinson M.D.   On: 10/09/2017 18:07      DISCUSSION: This is a 61 year old who presented yesterday evening with left hemiparesis and was who was found to have a right MCA and ICA occlusion.  He underwent thrombectomy and is now sedated and intubated in the intensive care unit.  His sheath is just been pulled and he is maintained on sedation for that purpose, I will be decreasing his sedation and reevaluating his neurological status anticipating extubation later this afternoon.  ASSESSMENT / PLAN:  PULMONARY A: He was intubated for airway protection  and for sedation during thrombectomy.  His chest x-ray this morning suggest some bibasilar infiltrates and I will remain vigilant for the possibility of aspiration with a with a low threshold for initiation of antibiotics.   CARDIOVASCULAR A: He has a history of chest pain but I know nothing further of his cardiac history.   NEUROLOGIC A: Status post right MCA and ICA thrombectomy. Aspirin and will require the addition of statin further stroke workup per neurology.  Greater than 32 minutes was spent in the care of this patient today  Penny Pia, MD Pulmonary and Critical Care Medicine [facility] Pager: [phone]  10/10/2017, 10:42 AM

## 2017-10-10 NOTE — Progress Notes (Signed)
SLP Cancellation Note  Patient Details Name: Raymond Lowery MRN: [id number] DOB: [birth year]   Cancelled treatment:       Reason Eval/Treat Not Completed: Patient not medically ready, ventilated.    Esme Freund, Riley NearingBonnie Caroline 10/10/2017, 9:06 AM

## 2017-10-10 NOTE — Plan of Care (Signed)
Will continue to monitor respiratory status.

## 2017-10-10 NOTE — Progress Notes (Signed)
STROKE TEAM PROGRESS NOTE  Admission history; Raymond Lowery is a 61 y.o. male with a history of tobacco abuse who presents with left sided weakness. He had transient symptoms this morning and subsequently improved. He actually worked for most of the day, but then hais wife saw him reaching with his right arm because he could not use his left arm well.  She therefore called 911.  She states that his left side has seemed to get somewhat better in the interim, but he still has significant deficits.  LKW: 0815am  tpa given?: no, outside of window.   SUBJECTIVE (INTERVAL HISTORY) Family  is at the bedside. Patient is found laying in bed in NAD, Intubated and sedated  No new events reported overnight.he still has groin arterial sheath  OBJECTIVE Lab Results: CBC:  Recent Labs  Lab 10/09/17 1749 10/09/17 1756 10/10/17 0337  WBC 9.9  --  12.4*  HGB 16.6 17.0 14.5  HCT 47.5 50.0 43.2  MCV 92.8  --  94.7  PLT 208  --  194   BMP: Recent Labs  Lab 10/09/17 1749 10/09/17 1756 10/10/17 0337  NA 137 138 135  K 3.8 3.7 4.1  CL 107 105 108  CO2 21*  --  21*  GLUCOSE 95 96 100*  BUN 9 9 6   CREATININE 0.78 0.70 0.69  CALCIUM 9.0  --  8.0*   Liver Function Tests:  Recent Labs  Lab 10/09/17 1749  AST 25  ALT 29  ALKPHOS 62  BILITOT 0.8  PROT 7.1  ALBUMIN 3.9   Coagulation Studies:  Recent Labs    10/09/17 1749  APTT 32  INR 0.95   PHYSICAL EXAM Temp:  [97.6 F (36.4 C)-98.6 F (37 C)] 98 F (36.7 C) (12/11 0800) Pulse Rate:  [51-87] 58 (12/11 1357) Resp:  [0-32] 14 (12/11 1330) BP: (93-155)/(49-93) 128/50 (12/11 1357) SpO2:  [94 %-100 %] 99 % (12/11 1330) Arterial Line BP: (106-148)/(46-74) 131/51 (12/11 1330) FiO2 (%):  [40 %-100 %] 40 % (12/11 1357) Weight:  [73.4 kg (161 lb 13.1 oz)-74.9 kg (165 lb 2 oz)] 73.4 kg (161 lb 13.1 oz) (12/11 0000) General - Well nourished, well developed middle aged Caucasian male, in no apparent distress Respiratory - Lungs  clear bilaterally. No wheezing. Cardiovascular - Regular rate and rhythm  Neuro: Mental Status: Patient is intubated and sedated but easily arouses to voice. Follows all commands. He appears to have significant left hemineglect Cranial Nerves: II: Visual Fields with partial left upper field cut. Pupils are equal, round, and reactive to light.   III,IV, VI: Right gaze preference, but does cross midline to the left V: Facial sensation is symmetric to temperature VII: Facial movement is decreased on the left VIII: hearing is intact to voice X: Uvula elevates symmetrically XI: Shoulder shrug is symmetric. XII: tongue is midline without atrophy or fasciculations.  Motor: Tone is normal. Bulk is normal. 5/5 strength was present on the right side, 4/5 in the left Sensory: Sensation is decreased on the left, but patient does not seem aware of this, he does extinguish Cerebellar: unable to test at this time  IMAGING: I have personally reviewed the radiological images below and agree with the radiology interpretations.  CTA Head/Neck & Cerebral Perfusion W Contrast Result Date: 10/09/2017 IMPRESSION: 1. Emergent large vessel occlusion of the right middle cerebral artery at its origin, in addition to long segment occlusion/critical stenosis of the right internal carotid artery beginning at the carotid bifurcation with a  CT angiographic string sign extending to the skullbase, where the artery is completely occluded. Enhancement at the terminal aspect of the right internal carotid artery the collateral flow from the anterior communicating and right posterior communicating arteries. 2. Expansile thrombus within the M1 segment of the right middle cerebral artery measures approximately 2 cm. Poor collateralization of the right MCA territory. 3. With perfusion findings indicating 17 mL infarction volume within the right insula and adjacent centrum semiovale, with 141 mL of ischemic penumbra occupying the  remainder of the right middle cerebral artery territory. 4. No hemodynamically significant stenosis of the left internal carotid artery or vertebral arteries.  Portable Chest Xray Result Date: 10/10/2017 IMPRESSION: Unchanged bibasilar airspace opacities which may reflect aspiration or infectious pneumonitis.   Dg Chest Port 1 View Result Date: 10/09/2017 IMPRESSION: 1. Satisfactory endotracheal tube position. 2. Further advancement of the patient's gastric tube by at least 4-5 cm is recommended as the side-port is approximately 1.9 cm above the GE junction. 3. Mild interstitial edema with patchy bilateral lower lobe airspace opacities suspicious for aspiration pneumonia.Chronic left seventh rib fracture.  Ct Head Code Stroke Wo Contrast Result Date: 10/09/2017 IMPRESSION: 1. No acute hemorrhage. 2. Hypoattenuation within the right insula with hyperdense appearance of the right middle cerebral artery, consistent with acute infarct. 3. ASPECTS is 9.   Echocardiogram:  Study Conclusions - Left ventricle: The cavity size was normal. Wall thickness was   normal. Systolic function was normal. The estimated ejection   fraction was in the range of 55% to 60%. Wall motion was normal;   there were no regional wall motion abnormalities. Left   ventricular diastolic function parameters were normal. Impressions: - Normal study.  MRI Head :                                                PENDING _____________________________________________________________________ ASSESSMENT: Raymond Lowery is a 61 y.o. male with PMH of Tobacco Abuse admitted with complaints of acute onset of Left sided weakness. CT perfusion-shows large area of penumbra with right M1 occlusion, suspected secondary to carotid disease.  He is going to IR for reperfusion. Though he had significant improvement in the interim between his initial symptoms and subsequent worsening, with his neglect and fact that he already has CT  change, I am concerned he may have had some injury this morning. I would therefore favor pursuing reperfusion with IR therapy rather than tPA  CEREBRAL ANGIOGRAM [[id number] (Custom)] S/P bilateral common carotid arteriograms and Rt vert angiogram,followed by comlete revascularizationof occluded RT MCA and terminal RT ICA with  x1 pass with solitaire 4mm x 40 mm retrieval device achieving a TICI 2b reperfusion and complete revascularization of acutelly occluded Rt ICA with stent assisted angioplasty  And use of 1 mg of IA aggrastat. And  12 mg of IA Integrelin  For rescue acute intrastent aggregation  With complete clearance. Also gave loading dose of  plavix 600mg  and 325 mg of aspirin via oro gastric tube.  Acute Right MCA infarct with Right M1 occlusion s/p Angioplasty/Stent :  Suspected Etiology: right M1 occlusion, suspect secondary to carotid disease Resultant Symptoms: Right gaze preference, partial left upper field cut, Left facial droop, left hemineglect,  Left sided weakness Stroke Risk Factors: carotid stenosis and smoking Other Stroke Risk Factors: Advanced age, Cigarette smoker,   Outstanding Stroke Work-up  Studies: MRI:                                                                      PENDING  10/10/17: Neuro exam remains stable.  Remains intubated and sedated but arouses easily to voice. Following all commands.  Long discussion with family at bedside by Dr. Pearlean Brownie and brain imaging reviewed.  Plan to remove IR sheath and extubate later today.  MRI pending overnight.  PLAN  10/10/2017: Continue Aspirin/ Plavix/ Statin Attempt to extubate later today Pull IR groin sheath Ongoing aggressive stroke risk factor management Patient's family counseled to be compliant with his antithrombotic medications Follow up with [facility] in 6 weeks  INTRACRANIAL Atherosclerosis &Stenosis s/p IC stent: On DAPT, continue for 6 months and then Plavix alone Follow up with IR - Dr  Corliss Skains  DYSPHAGIA: NPO until passes SLP swallow evaluation  Intubated for airway protection/sedation for IR procedure Leukocytosis Hypocalcemia CXR - Aspiration vs Pneumonia Medical Issues Management per CCM team - appreciate assistance   HYPERTENSION: Stable BP goal: Parameters set by IR team Given successful thrombectomy - 120-140 SBP range Nicardipine drip, Labetolol PRN Long term BP goal normotensive. May slowly start  B/P medications after 48 hours Home Meds: NONE  HYPERLIPIDEMIA:    Component Value Date/Time   CHOL 121 10/10/2017 0337   TRIG 123 10/10/2017 0337   HDL 27 (L) 10/10/2017 0337   CHOLHDL 4.5 10/10/2017 0337   VLDL 25 10/10/2017 0337   LDLCALC 69 10/10/2017 0337  Home Meds:  NONE LDL  goal < 70 Started on Lipitor to 10 mg daily Continue statin at discharge  DIABETES: Lab Results  Component Value Date   HGBA1C 5.3 10/10/2017  HgbA1c goal < 7.0  TOBACCO ABUSE Current smoker Smoking cessation counseling will be provided once alert Nicotine patch provided -PRN  Other Active Problems: Active Problems:   Stroke (cerebrum) (HCC)   Acute hypoxemic respiratory failure [facility])  Hospital day # 1  VTE prophylaxis:  SCD's  Diet : Diet NPO time specified   Prior Home Stroke Medications: No antithrombotic  Discharge Stroke Meds: Please discharge patient on aspirin 325 mg daily, clopidogrel 75 mg daily and Lipitor 10 mg   Disposition:       Pending Therapy Recs:   Pending Follow Recs:  No primary care provider on file. - Case management aware  FAMILY UPDATES: family at bedside  TEAM UPDATES: Micki Riley, MD  Brita Romp Stroke Neurology Team 10/10/2017 2:07 PM I have personally examined this patient, reviewed notes, independently viewed imaging studies, participated in medical decision making and plan of care.ROS completed by me personally and pertinent positives fully documented  I have made any additions or clarifications  directly to the above note. Agree with note above. Patient presented with right M1 occlusion secondary to embolization from high-grade proximal right ICA stenosis requiring emergent right carotid stenting followed by mechanical embolectomy with good recanalization. Maintain strict control of blood pressure. Discontinuing groin arterial sheath. Extubate later today as tolerated. Check MRI scan of the brain. Continue aspirin and Plavix due to/stent. Long discussion with the bedside with the patient's wife and family members and answered questions. Discussed with Dr. Warren Lacy. This patient is critically  ill and at significant risk of neurological worsening, death and care requires constant monitoring of vital signs, hemodynamics,respiratory and cardiac monitoring, extensive review of multiple databases, frequent neurological assessment, discussion with family, other specialists and medical decision making of high complexity.I have made any additions or clarifications directly to the above note.This critical care time does not reflect procedure time, or teaching time or supervisory time of PA/NP/Med Resident etc but could involve care discussion time.  I spent 50 minutes of neurocritical care time  in the care of  this patient.      Delia HeadyPramod Anthonymichael Munday, MD Medical Director [facility] Pager: [phone] 10/10/2017 2:51 PM  To contact Stroke Continuity provider, please refer to [email or web address]. After hours, contact General Neurology

## 2017-10-10 NOTE — Progress Notes (Signed)
  Echocardiogram 2D Echocardiogram has been performed.  Liyla Radliff L Androw 10/10/2017, 10:12 AM

## 2017-10-10 NOTE — Progress Notes (Signed)
Referring Physician(s): Dr Lina Sayre  Supervising Physician: Julieanne Cotton  Patient Status:  [facility]  Chief Complaint:  CVA 12/10: Revascularizationof occluded RT MCA and terminal RT ICA with  x1 pass with solitaire 4mm x 40 mm retrieval device achieving a TICI 2b reperfusion and complete revascularization of acutelly occluded Rt ICA with stent assisted angioplasty  Subjective:  Intubated Moving all 4s to command   Allergies: Patient has no allergy information on record.  Medications: Prior to Admission medications   Not on File     Vital Signs: BP (!) 113/49   Pulse (!) 58   Temp 97.6 F (36.4 C) (Axillary)   Resp 14   Ht 5\' 10"  (1.778 m)   Wt 161 lb 13.1 oz (73.4 kg)   SpO2 100%   BMI 23.22 kg/m   Physical Exam  Abdominal: Soft.  Musculoskeletal:  Moves all 4s to command = strength  Neurological:  Opens eyes to command tries to communicate  Skin: Skin is warm and dry.  Rt groin cl;ean and dry NT; dime size ooze on bandage No hematoma Sheath intact  Rt foot 2+ pulses   Nursing note and vitals reviewed.   Imaging: Ct Angio Head W Or Wo Contrast  Result Date: 10/09/2017 CLINICAL DATA:  Left facial droop and left-sided weakness. EXAM: CT ANGIOGRAPHY HEAD AND NECK CT PERFUSION BRAIN TECHNIQUE: Multidetector CT imaging of the head and neck was performed using the standard protocol during bolus administration of intravenous contrast. Multiplanar CT image reconstructions and MIPs were obtained to evaluate the vascular anatomy. Carotid stenosis measurements (when applicable) are obtained utilizing NASCET criteria, using the distal internal carotid diameter as the denominator. Multiphase CT imaging of the brain was performed following IV bolus contrast injection. Subsequent parametric perfusion maps were calculated using RAPID software. CONTRAST:  ISOVUE-370 IOPAMIDOL (ISOVUE-370) INJECTION 76% COMPARISON:  Head CT same day FINDINGS: CTA NECK  FINDINGS Aortic arch: There is minimal calcific atherosclerosis of the aortic arch. There is no aneurysm, dissection or hemodynamically significant stenosis of the visualized ascending aorta and aortic arch. Normal variant aortic arch branching pattern with the brachiocephalic and left common carotid arteries sharing a common origin. Mild atherosclerotic plaque in the left subclavian artery origin. No hemodynamically significant subclavian artery stenosis. Right carotid system: Right common carotid artery is widely patent. There is predominantly noncalcified plaque at the carotid bifurcation with complete occlusion of the right internal carotid artery origin. There is critical stenosis of the proximal right internal carotid artery with a string sign mid extends approximately to the skullbase. There is complete occlusion of the skullbase segments of the right ICA with short segment reconstitution of the communicating segment. Left carotid system: The left common carotid origin is widely patent. There is no common carotid or internal carotid artery dissection or aneurysm. Atherosclerotic calcification at the carotid bifurcation without hemodynamically significant stenosis. Vertebral arteries: The vertebral system is codominant. Both vertebral artery origins are normal. Both vertebral arteries are normal to their confluence with the basilar artery. Skeleton: There is no bony spinal canal stenosis. No lytic or blastic lesions. Other neck: The nasopharynx is clear. The oropharynx and hypopharynx are normal. The epiglottis is normal. The supraglottic larynx, glottis and subglottic larynx are normal. No retropharyngeal collection. The parapharyngeal spaces are preserved. The parotid and submandibular glands are normal. No sialolithiasis or salivary ductal dilatation. The thyroid gland is normal. There is no cervical lymphadenopathy. Upper chest: No pneumothorax or pleural effusion. No nodules or masses.  Review of the MIP  images confirms the above findings CTA HEAD FINDINGS Anterior circulation: --Intracranial internal carotid arteries: There is short segment opacification of the communicating portion of the right internal carotid artery, which is otherwise occluded. --Anterior cerebral arteries: Limited opacification of the right A1 segment. Otherwise normal anterior cerebral arteries. --Middle cerebral arteries: The right middle cerebral artery is occluded at its origin with thrombus that measures approximately 2.2 cm. There is severely attenuated enhancement of the distal MCA branches. Poor collateralization within the right MCA territory. The left middle cerebral artery is normal. --Posterior communicating arteries: There is a small right posterior communicating artery. None is visualized on the left. Posterior circulation: --Posterior cerebral arteries: Normal. --Superior cerebellar arteries: Normal. --Basilar artery: Normal. --Anterior inferior cerebellar arteries: Normal. --Posterior inferior cerebellar arteries: Normal. Venous sinuses: As permitted by contrast timing, patent. Anatomic variants: None Delayed phase: Not performed. Review of the MIP images confirms the above findings CT Brain Perfusion Findings: CBF (<30%) Volume: 17mL Perfusion (Tmax>6.0s) volume: Mismatch Volume: Infarction Location:Right insula and adjacent right frontal white matter. IMPRESSION: 1. Emergent large vessel occlusion of the right middle cerebral artery at its origin, in addition to long segment occlusion/critical stenosis of the right internal carotid artery beginning at the carotid bifurcation with a CT angiographic string sign extending to the skullbase, where the artery is completely occluded. Enhancement at the terminal aspect of the right internal carotid artery the collateral flow from the anterior communicating and right posterior communicating arteries. 2. Expansile thrombus within the M1 segment of the right middle cerebral  artery measures approximately 2 cm. Poor collateralization of the right MCA territory. 3. With perfusion findings indicating 17 mL infarction volume within the right insula and adjacent centrum semiovale, with 141 mL of ischemic penumbra occupying the remainder of the right middle cerebral artery territory. 4. No hemodynamically significant stenosis of the left internal carotid artery or vertebral arteries. Critical Value/emergent results were called by telephone at the time of interpretation on 10/09/2017 at 6:30 pm to Dr. Ritta Slot , who verbally acknowledged these results. Electronically Signed   By: Deatra Robinson M.D.   On: 10/09/2017 18:59   Ct Angio Neck W Or Wo Contrast  Result Date: 10/09/2017 CLINICAL DATA:  Left facial droop and left-sided weakness. EXAM: CT ANGIOGRAPHY HEAD AND NECK CT PERFUSION BRAIN TECHNIQUE: Multidetector CT imaging of the head and neck was performed using the standard protocol during bolus administration of intravenous contrast. Multiplanar CT image reconstructions and MIPs were obtained to evaluate the vascular anatomy. Carotid stenosis measurements (when applicable) are obtained utilizing NASCET criteria, using the distal internal carotid diameter as the denominator. Multiphase CT imaging of the brain was performed following IV bolus contrast injection. Subsequent parametric perfusion maps were calculated using RAPID software. CONTRAST:  ISOVUE-370 IOPAMIDOL (ISOVUE-370) INJECTION 76% COMPARISON:  Head CT same day FINDINGS: CTA NECK FINDINGS Aortic arch: There is minimal calcific atherosclerosis of the aortic arch. There is no aneurysm, dissection or hemodynamically significant stenosis of the visualized ascending aorta and aortic arch. Normal variant aortic arch branching pattern with the brachiocephalic and left common carotid arteries sharing a common origin. Mild atherosclerotic plaque in the left subclavian artery origin. No hemodynamically significant  subclavian artery stenosis. Right carotid system: Right common carotid artery is widely patent. There is predominantly noncalcified plaque at the carotid bifurcation with complete occlusion of the right internal carotid artery origin. There is critical stenosis of the proximal right internal carotid artery with a string sign  mid extends approximately to the skullbase. There is complete occlusion of the skullbase segments of the right ICA with short segment reconstitution of the communicating segment. Left carotid system: The left common carotid origin is widely patent. There is no common carotid or internal carotid artery dissection or aneurysm. Atherosclerotic calcification at the carotid bifurcation without hemodynamically significant stenosis. Vertebral arteries: The vertebral system is codominant. Both vertebral artery origins are normal. Both vertebral arteries are normal to their confluence with the basilar artery. Skeleton: There is no bony spinal canal stenosis. No lytic or blastic lesions. Other neck: The nasopharynx is clear. The oropharynx and hypopharynx are normal. The epiglottis is normal. The supraglottic larynx, glottis and subglottic larynx are normal. No retropharyngeal collection. The parapharyngeal spaces are preserved. The parotid and submandibular glands are normal. No sialolithiasis or salivary ductal dilatation. The thyroid gland is normal. There is no cervical lymphadenopathy. Upper chest: No pneumothorax or pleural effusion. No nodules or masses. Review of the MIP images confirms the above findings CTA HEAD FINDINGS Anterior circulation: --Intracranial internal carotid arteries: There is short segment opacification of the communicating portion of the right internal carotid artery, which is otherwise occluded. --Anterior cerebral arteries: Limited opacification of the right A1 segment. Otherwise normal anterior cerebral arteries. --Middle cerebral arteries: The right middle cerebral artery  is occluded at its origin with thrombus that measures approximately 2.2 cm. There is severely attenuated enhancement of the distal MCA branches. Poor collateralization within the right MCA territory. The left middle cerebral artery is normal. --Posterior communicating arteries: There is a small right posterior communicating artery. None is visualized on the left. Posterior circulation: --Posterior cerebral arteries: Normal. --Superior cerebellar arteries: Normal. --Basilar artery: Normal. --Anterior inferior cerebellar arteries: Normal. --Posterior inferior cerebellar arteries: Normal. Venous sinuses: As permitted by contrast timing, patent. Anatomic variants: None Delayed phase: Not performed. Review of the MIP images confirms the above findings CT Brain Perfusion Findings: CBF (<30%) Volume: 17mL Perfusion (Tmax>6.0s) volume: 158mL Mismatch Volume: 141mL Infarction Location:Right insula and adjacent right frontal white matter. IMPRESSION: 1. Emergent large vessel occlusion of the right middle cerebral artery at its origin, in addition to long segment occlusion/critical stenosis of the right internal carotid artery beginning at the carotid bifurcation with a CT angiographic string sign extending to the skullbase, where the artery is completely occluded. Enhancement at the terminal aspect of the right internal carotid artery the collateral flow from the anterior communicating and right posterior communicating arteries. 2. Expansile thrombus within the M1 segment of the right middle cerebral artery measures approximately 2 cm. Poor collateralization of the right MCA territory. 3. With perfusion findings indicating 17 mL infarction volume within the right insula and adjacent centrum semiovale, with 141 mL of ischemic penumbra occupying the remainder of the right middle cerebral artery territory. 4. No hemodynamically significant stenosis of the left internal carotid artery or vertebral arteries. Critical  Value/emergent results were called by telephone at the time of interpretation on 10/09/2017 at 6:30 pm to Dr. Ritta SlotMCNEILL KIRKPATRICK , who verbally acknowledged these results. Electronically Signed   By: Deatra RobinsonKevin  Herman M.D.   On: 10/09/2017 18:59   Ct Cerebral Perfusion W Contrast  Result Date: 10/09/2017 CLINICAL DATA:  Left facial droop and left-sided weakness. EXAM: CT ANGIOGRAPHY HEAD AND NECK CT PERFUSION BRAIN TECHNIQUE: Multidetector CT imaging of the head and neck was performed using the standard protocol during bolus administration of intravenous contrast. Multiplanar CT image reconstructions and MIPs were obtained to evaluate the vascular anatomy. Carotid stenosis measurements (  when applicable) are obtained utilizing NASCET criteria, using the distal internal carotid diameter as the denominator. Multiphase CT imaging of the brain was performed following IV bolus contrast injection. Subsequent parametric perfusion maps were calculated using RAPID software. CONTRAST:  ISOVUE-370 IOPAMIDOL (ISOVUE-370) INJECTION 76% COMPARISON:  Head CT same day FINDINGS: CTA NECK FINDINGS Aortic arch: There is minimal calcific atherosclerosis of the aortic arch. There is no aneurysm, dissection or hemodynamically significant stenosis of the visualized ascending aorta and aortic arch. Normal variant aortic arch branching pattern with the brachiocephalic and left common carotid arteries sharing a common origin. Mild atherosclerotic plaque in the left subclavian artery origin. No hemodynamically significant subclavian artery stenosis. Right carotid system: Right common carotid artery is widely patent. There is predominantly noncalcified plaque at the carotid bifurcation with complete occlusion of the right internal carotid artery origin. There is critical stenosis of the proximal right internal carotid artery with a string sign mid extends approximately to the skullbase. There is complete occlusion of the skullbase  segments of the right ICA with short segment reconstitution of the communicating segment. Left carotid system: The left common carotid origin is widely patent. There is no common carotid or internal carotid artery dissection or aneurysm. Atherosclerotic calcification at the carotid bifurcation without hemodynamically significant stenosis. Vertebral arteries: The vertebral system is codominant. Both vertebral artery origins are normal. Both vertebral arteries are normal to their confluence with the basilar artery. Skeleton: There is no bony spinal canal stenosis. No lytic or blastic lesions. Other neck: The nasopharynx is clear. The oropharynx and hypopharynx are normal. The epiglottis is normal. The supraglottic larynx, glottis and subglottic larynx are normal. No retropharyngeal collection. The parapharyngeal spaces are preserved. The parotid and submandibular glands are normal. No sialolithiasis or salivary ductal dilatation. The thyroid gland is normal. There is no cervical lymphadenopathy. Upper chest: No pneumothorax or pleural effusion. No nodules or masses. Review of the MIP images confirms the above findings CTA HEAD FINDINGS Anterior circulation: --Intracranial internal carotid arteries: There is short segment opacification of the communicating portion of the right internal carotid artery, which is otherwise occluded. --Anterior cerebral arteries: Limited opacification of the right A1 segment. Otherwise normal anterior cerebral arteries. --Middle cerebral arteries: The right middle cerebral artery is occluded at its origin with thrombus that measures approximately 2.2 cm. There is severely attenuated enhancement of the distal MCA branches. Poor collateralization within the right MCA territory. The left middle cerebral artery is normal. --Posterior communicating arteries: There is a small right posterior communicating artery. None is visualized on the left. Posterior circulation: --Posterior cerebral  arteries: Normal. --Superior cerebellar arteries: Normal. --Basilar artery: Normal. --Anterior inferior cerebellar arteries: Normal. --Posterior inferior cerebellar arteries: Normal. Venous sinuses: As permitted by contrast timing, patent. Anatomic variants: None Delayed phase: Not performed. Review of the MIP images confirms the above findings CT Brain Perfusion Findings: CBF (<30%) Volume: 17mL Perfusion (Tmax>6.0s) volume: Mismatch Volume: Infarction Location:Right insula and adjacent right frontal white matter. IMPRESSION: 1. Emergent large vessel occlusion of the right middle cerebral artery at its origin, in addition to long segment occlusion/critical stenosis of the right internal carotid artery beginning at the carotid bifurcation with a CT angiographic string sign extending to the skullbase, where the artery is completely occluded. Enhancement at the terminal aspect of the right internal carotid artery the collateral flow from the anterior communicating and right posterior communicating arteries. 2. Expansile thrombus within the M1 segment of the right middle cerebral artery measures approximately 2 cm.  Poor collateralization of the right MCA territory. 3. With perfusion findings indicating 17 mL infarction volume within the right insula and adjacent centrum semiovale, with 141 mL of ischemic penumbra occupying the remainder of the right middle cerebral artery territory. 4. No hemodynamically significant stenosis of the left internal carotid artery or vertebral arteries. Critical Value/emergent results were called by telephone at the time of interpretation on 10/09/2017 at 6:30 pm to Dr. Ritta Slot , who verbally acknowledged these results. Electronically Signed   By: Deatra Robinson M.D.   On: 10/09/2017 18:59   Portable Chest Xray  Result Date: 10/10/2017 CLINICAL DATA:  Acute hypoxemic respiratory failure. EXAM: PORTABLE CHEST 1 VIEW COMPARISON:  10/09/2017 FINDINGS: The  endotracheal tube is unchanged, position well above the carina. The enteric tube courses into the left upper abdomen with tip not imaged. The cardiac silhouette is normal in size. The lungs are hyperinflated with mild interstitial coarsening, likely chronic. Patchy bibasilar airspace opacities have not significantly changed. No sizable pleural effusion or pneumothorax is identified. IMPRESSION: Unchanged bibasilar airspace opacities which may reflect aspiration or infectious pneumonitis. Electronically Signed   By: Sebastian Ache M.D.   On: 10/10/2017 07:20   Dg Chest Port 1 View  Result Date: 10/09/2017 CLINICAL DATA:  Intubation and gastric tube placement for stroke. EXAM: PORTABLE CHEST 1 VIEW COMPARISON:  10/01/2012 FINDINGS: Patchy bibasilar airspace opacities are noted suspicious for aspiration. The lungs are otherwise mildly hyperinflated. Heart size is top-normal. There is minimal aortic atherosclerosis. No acute osseous abnormality. There is mild interstitial edema. The tip of an endotracheal tube is in satisfactory position approximately 6.5 cm above the carina. The side-port in a gastric tube is approximately 1.9 cm proximal to the GE junction and further advancement of the gastric tube by at least 4-5 cm is recommended. Chronic left seventh rib fracture. IMPRESSION: 1. Satisfactory endotracheal tube position. 2. Further advancement of the patient's gastric tube by at least 4-5 cm is recommended as the side-port is approximately 1.9 cm above the GE junction. 3. Mild interstitial edema with patchy bilateral lower lobe airspace opacities suspicious for aspiration pneumonia. Electronically Signed   By: Tollie Eth M.D.   On: 10/09/2017 23:40   Ct Head Code Stroke Wo Contrast  Result Date: 10/09/2017 CLINICAL DATA:  Code stroke.  Left-sided weakness and facial droop EXAM: CT HEAD WITHOUT CONTRAST TECHNIQUE: Contiguous axial images were obtained from the base of the skull through the vertex without  intravenous contrast. COMPARISON:  None. FINDINGS: Brain: There is hypoattenuation within the right insula with loss of the normal insular ribbon. Gray-white differentiation is otherwise preserved. There is no acute hemorrhage. No mass lesion or midline shift. No extra-axial collection. Vascular: There is a hyperdense right middle cerebral artery. Skull: Normal skullbase and calvarium. Sinuses/Orbits: There is near complete opacification of the atelectatic right maxillary sinus. The other paranasal sinuses are clear. No mastoid or middle ear effusion. The orbits are normal. Other: None ASPECTS (Alberta Stroke Program Early CT Score) - Ganglionic level infarction (caudate, lentiform nuclei, internal capsule, insula, M1-M3 cortex): 6 - Supraganglionic infarction (M4-M6 cortex): 3 Total score (0-10 with 10 being normal): 9 IMPRESSION: 1. No acute hemorrhage. 2. Hypoattenuation within the right insula with hyperdense appearance of the right middle cerebral artery, consistent with acute infarct. 3. ASPECTS is 9. These results were called by telephone at the time of interpretation on 10/09/2017 at 5:58 Pm to Dr. Ritta Slot , who verbally acknowledged these results. Electronically Signed   By: Caryn Bee  Chase Picket M.D.   On: 10/09/2017 18:07    Labs:  CBC: Recent Labs    10/09/17 1749 10/09/17 1756 10/10/17 0337  WBC 9.9  --  12.4*  HGB 16.6 17.0 14.5  HCT 47.5 50.0 43.2  PLT 208  --  194    COAGS: Recent Labs    10/09/17 1749  INR 0.95  APTT 32    BMP: Recent Labs    10/09/17 1749 10/09/17 1756 10/10/17 0337  NA 137 138 135  K 3.8 3.7 4.1  CL 107 105 108  CO2 21*  --  21*  GLUCOSE 95 96 100*  BUN 9 9 6   CALCIUM 9.0  --  8.0*  CREATININE 0.78 0.70 0.69  GFRNONAA >60  --  >60  GFRAA >60  --  >60    LIVER FUNCTION TESTS: Recent Labs    10/09/17 1749  BILITOT 0.8  AST 25  ALT 29  ALKPHOS 62  PROT 7.1  ALBUMIN 3.9    Assessment and Plan:  CVA Rt MCA/ICA  revascularization 12/10 in IR- Dr Corliss Skains Plavix/ASA daily Check P2y12 now  Electronically Signed: Daliah Chaudoin A, PA-C 10/10/2017, 9:16 AM   I spent a total of 15 Minutes at the the patient's bedside AND on the patient's hospital floor or unit, greater than 50% of which was counseling/coordinating care for CVA; R MCA/ICA revascularization

## 2017-10-10 NOTE — Progress Notes (Signed)
Dr. Corliss Skainseveshwar called and notified that patient now has a small hematoma and some oozing that is showing through guaze dressing on groin. Pressure held for 20 minutes as directed by MD. Will continue to monitor.

## 2017-10-10 NOTE — Progress Notes (Signed)
PT Cancellation Note  Patient Details Name: Raymond Lowery MRN: [id number] DOB: 03-19-1956   Cancelled Treatment:    Reason Eval/Treat Not Completed: Patient not medically ready. Pt remains intubated and just had a sheath removed. PT to await updated activity orders and medically stability prior to completing PT eval.   Sourish Allender M Malasia Torain 10/10/2017, 11:06 AM   Lewis ShockAshly Rey Fors, PT, DPT Pager #: [phone] Office #: [phone]

## 2017-10-11 ENCOUNTER — Encounter (HOSPITAL_COMMUNITY): Payer: Self-pay | Admitting: Interventional Radiology

## 2017-10-11 ENCOUNTER — Inpatient Hospital Stay (HOSPITAL_COMMUNITY): Payer: 59

## 2017-10-11 DIAGNOSIS — I639 Cerebral infarction, unspecified: Secondary | ICD-10-CM

## 2017-10-11 DIAGNOSIS — I63231 Cerebral infarction due to unspecified occlusion or stenosis of right carotid arteries: Secondary | ICD-10-CM

## 2017-10-11 LAB — CBC WITH DIFFERENTIAL/PLATELET
BASOS ABS: 0 10*3/uL (ref 0.0–0.1)
Basophils Relative: 0 %
EOS ABS: 0.1 10*3/uL (ref 0.0–0.7)
EOS PCT: 1 %
HCT: 38.5 % — ABNORMAL LOW (ref 39.0–52.0)
HEMOGLOBIN: 12.7 g/dL — AB (ref 13.0–17.0)
LYMPHS PCT: 28 %
Lymphs Abs: 2.5 10*3/uL (ref 0.7–4.0)
MCH: 31.6 pg (ref 26.0–34.0)
MCHC: 33 g/dL (ref 30.0–36.0)
MCV: 95.8 fL (ref 78.0–100.0)
Monocytes Absolute: 0.6 10*3/uL (ref 0.1–1.0)
Monocytes Relative: 7 %
NEUTROS PCT: 64 %
Neutro Abs: 5.5 10*3/uL (ref 1.7–7.7)
PLATELETS: 185 10*3/uL (ref 150–400)
RBC: 4.02 MIL/uL — AB (ref 4.22–5.81)
RDW: 13.9 % (ref 11.5–15.5)
WBC: 8.7 10*3/uL (ref 4.0–10.5)

## 2017-10-11 LAB — BASIC METABOLIC PANEL
ANION GAP: 5 (ref 5–15)
BUN: 5 mg/dL — AB (ref 6–20)
CALCIUM: 7.9 mg/dL — AB (ref 8.9–10.3)
CO2: 23 mmol/L (ref 22–32)
CREATININE: 0.6 mg/dL — AB (ref 0.61–1.24)
Chloride: 108 mmol/L (ref 101–111)
GFR calc Af Amer: >60 mL/min (ref >60)
GLUCOSE: 86 mg/dL (ref 65–99)
Potassium: 3.6 mmol/L (ref 3.5–5.1)
Sodium: 136 mmol/L (ref 135–145)

## 2017-10-11 LAB — PLATELET INHIBITION P2Y12: Platelet Function  P2Y12: 24 [PRU] — ABNORMAL LOW (ref 194–418)

## 2017-10-11 MED ORDER — CLOPIDOGREL BISULFATE 75 MG PO TABS: 75.0000 mg | ORAL_TABLET | Freq: Every day | ORAL | 1 refills | Status: DC

## 2017-10-11 MED ORDER — ATORVASTATIN CALCIUM 40 MG PO TABS: 40.0000 mg | ORAL_TABLET | Freq: Every day | ORAL | 1 refills | Status: DC

## 2017-10-11 MED ORDER — ASPIRIN 325 MG PO TABS: 325.0000 mg | ORAL_TABLET | Freq: Every day | ORAL | 1 refills | Status: DC

## 2017-10-11 MED ORDER — ATORVASTATIN CALCIUM 40 MG PO TABS: 40.0000 mg | ORAL_TABLET | Freq: Every day | ORAL | Status: DC

## 2017-10-11 NOTE — Progress Notes (Signed)
PULMONARY / CRITICAL CARE MEDICINE   Name: Raymond Lowery MRN: [id number] DOB: Dec 03, 1955    ADMISSION DATE:  10/09/2017   CHIEF COMPLAINT: Left hemiplegia  HISTORY OF PRESENT ILLNESS:   This is a 61 year old who presented with left hemiplegia and was found on CTA to have a right MCA and ICA occlusion.  He was extubated yesterday evening.  He has no unusual complaints today and has been up ambulating.  He denies cognitive or motor defects.  He denies chest pain he denies cough or dyspnea.    PAST MEDICAL HISTORY :  He  has no past medical history on file.  PAST SURGICAL HISTORY: He  has a past surgical history that includes Radiology with anesthesia (N/A, 10/09/2017); IR ANGIO EXTRACRAN SEL COM CAROTID INNOMINATE UNI L MOD SED (10/09/2017); IR PERCUTANEOUS ART THROMBECTOMY/INFUSION INTRACRANIAL INC DIAG ANGIO (10/09/2017); IR INTRAVSC STENT CERV CAROTID W/O EMB-PROT MOD SED (10/09/2017); and IR ANGIO VERTEBRAL SEL SUBCLAVIAN INNOMINATE UNI R MOD SED (10/09/2017).  Not on File  No current facility-administered medications on file prior to encounter.    Current Outpatient Medications on File Prior to Encounter  Medication Sig  . aspirin 325 MG tablet Take 325 mg by mouth every 6 (six) hours as needed for mild pain.  . sildenafil (VIAGRA) 25 MG tablet Take 25 mg by mouth daily as needed for erectile dysfunction.    FAMILY HISTORY:  His has no family status information on file.    SOCIAL HISTORY: He  reports that he has been smoking cigarettes.  He has quit using smokeless tobacco.  REVIEW OF SYSTEMS:   Unobtainable  SUBJECTIVE:  Unobtainable  VITAL SIGNS: BP 106/68   Pulse 68   Temp 98.5 F (36.9 C) (Oral)   Resp 15   Ht 5\' 10"  (1.778 m)   Wt 161 lb 13.1 oz (73.4 kg)   SpO2 94%   BMI 23.22 kg/m   HEMODYNAMICS:    VENTILATOR SETTINGS: Vent Mode: CPAP;PSV FiO2 (%):  [40 %] 40 % Set Rate:  [14 bmp] 14 bmp Vt Set:  [580 mL] 580 mL PEEP:  [5 cmH20] 5  cmH20 Pressure Support:  [10 cmH20] 10 cmH20 Plateau Pressure:  [16 cmH20] 16 cmH20  INTAKE / OUTPUT: I/O last 3 completed shifts: In: 2594.3 [I.V.:2594.3] Out: 3080 [Urine:3005; Blood:75]  PHYSICAL EXAMINATION: General: Sitting up in a chair eating breakfast.  He is entirely alert and appropriate.   Neuro: He is oriented x3 and conversant.  Pupils are equal and reactive, he is moving all fours on request.   Cardiovascular: S1 and S2 are regular without murmur rub or gallop Lungs: Respirations are unlabored there is symmetric air movement no wheezes, and very few rhonchi. Abdomen: The abdomen is flat and soft without any organomegaly masses tenderness guarding or rebound.  There is no groin hematoma.  Dressing in the right groin is dry. Musculoskeletal: 2+ symmetric dorsalis pedis pulses, the feet are pink and warm   LABS:  BMET Recent Labs  Lab 10/09/17 1749 10/09/17 1756 10/10/17 0337 10/11/17 0438  NA 137 138 135 136  K 3.8 3.7 4.1 3.6  CL 107 105 108 108  CO2 21*  --  21* 23  BUN 9 9 6  5*  CREATININE 0.78 0.70 0.69 0.60*  GLUCOSE 95 96 100* 86    Electrolytes Recent Labs  Lab 10/09/17 1749 10/10/17 0337 10/11/17 0438  CALCIUM 9.0 8.0* 7.9*    CBC Recent Labs  Lab 10/09/17 1749 10/09/17 1756 10/10/17 [id number]  10/11/17 0438  WBC 9.9  --  12.4* 8.7  HGB 16.6 17.0 14.5 12.7*  HCT 47.5 50.0 43.2 38.5*  PLT 208  --  194 185    Coag's Recent Labs  Lab 10/09/17 1749  APTT 32  INR 0.95    Sepsis Markers No results for input(s): LATICACIDVEN, PROCALCITON, O2SATVEN in the last 168 hours.  ABG Recent Labs  Lab 10/10/17 0010  PHART 7.344*  PCO2ART 41.3  PO2ART 337*    Liver Enzymes Recent Labs  Lab 10/09/17 1749  AST 25  ALT 29  ALKPHOS 62  BILITOT 0.8  ALBUMIN 3.9    Cardiac Enzymes No results for input(s): TROPONINI, PROBNP in the last 168 hours.  Glucose Recent Labs  Lab 10/09/17 1747 10/10/17 0806  GLUCAP 90 114*     Imaging [facility] Wo Contrast  Result Date: 10/10/2017 CLINICAL DATA:  Stroke follow-up.  Left-sided weakness. EXAM: MRI HEAD WITHOUT CONTRAST MRA HEAD WITHOUT CONTRAST MRA NECK WITHOUT CONTRAST TECHNIQUE: Multiplanar, multiecho pulse sequences of the brain and surrounding structures were obtained without intravenous contrast. Angiographic images of the Circle of Willis were obtained using MRA technique without intravenous contrast. Angiographic images of the neck were obtained using MRA technique without intravenous contrast. Carotid stenosis measurements (when applicable) are obtained utilizing NASCET criteria, using the distal internal carotid diameter as the denominator. COMPARISON:  Head CT 10/09/2017 FINDINGS: MRI HEAD FINDINGS Brain: The midline structures are normal. There is abnormal diffusion restriction within the right MCA territory, predominantly involving the posterior right lentiform nucleus, right caudate body and the right insular and frontal operculum cortices. There is hyperintense T2-weighted signal corresponding to the areas of diffusion restriction. Otherwise, there are scattered areas of hyperintense T2-weighted signal in the subcortical white matter within both hemispheres. No acute hemorrhage. No mass lesion. No chronic microhemorrhage or cerebral amyloid angiopathy. No hydrocephalus, age advanced atrophy or lobar predominant volume loss. No dural abnormality or extra-axial collection. Skull and upper cervical spine: The visualized skull base, calvarium, upper cervical spine and extracranial soft tissues are normal. Sinuses/Orbits: No fluid levels or advanced mucosal thickening. No mastoid effusion. Normal orbits. MRA HEAD FINDINGS Intracranial internal carotid arteries: Normal. Anterior cerebral arteries: Normal. Middle cerebral arteries: Normal. Posterior communicating arteries: Present on the right only. Posterior cerebral arteries: Normal. Basilar artery: Normal. Vertebral  arteries: Left dominant. Normal. Superior cerebellar arteries: Normal. Anterior inferior cerebellar arteries: Normal. Posterior inferior cerebellar arteries: Normal. MRA NECK FINDINGS Aortic arch: Normal 3 vessel aortic branching pattern. The visualized subclavian arteries are normal. Right carotid system: There is no flow related enhancement within the proximal right internal carotid artery, including approximately a 4 cm segment. This is in keeping with the findings of the recent catheter angiogram. Left carotid system: Normal course and caliber without stenosis or evidence of dissection. Vertebral arteries: Codominant. Vertebral artery origins are not visualized. Vertebral arteries are normal in course and caliber to the vertebrobasilar confluence without stenosis or evidence of dissection. IMPRESSION: 1. Right MCA territory infarction involving the basal ganglia and the right insular and frontal operculum air cortices. This corresponds to the area of cord infarct demonstrated on the perfusion study of 10/09/2017. 2. Patent intracranial arteries with no high-grade stenosis, status post mechanical thrombectomy. 3. 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 4. No acute hemorrhage or mass effect. Of note, areas of hyperdensity seen on the head CT performed 10/09/2017 at 22:52 show no susceptibility weighted  abnormality, thus most likely to indicate contrast staining. Electronically Signed   By: Deatra Robinson M.D.   On: 10/10/2017 20:25   Mr Maxine Glenn Neck Wo Contrast  Result Date: 10/10/2017 CLINICAL DATA:  Stroke follow-up.  Left-sided weakness. EXAM: MRI HEAD WITHOUT CONTRAST MRA HEAD WITHOUT CONTRAST MRA NECK WITHOUT CONTRAST TECHNIQUE: Multiplanar, multiecho pulse sequences of the brain and surrounding structures were obtained without intravenous contrast. Angiographic images of the Circle of Willis were obtained using MRA technique  without intravenous contrast. Angiographic images of the neck were obtained using MRA technique without intravenous contrast. Carotid stenosis measurements (when applicable) are obtained utilizing NASCET criteria, using the distal internal carotid diameter as the denominator. COMPARISON:  Head CT 10/09/2017 FINDINGS: MRI HEAD FINDINGS Brain: The midline structures are normal. There is abnormal diffusion restriction within the right MCA territory, predominantly involving the posterior right lentiform nucleus, right caudate body and the right insular and frontal operculum cortices. There is hyperintense T2-weighted signal corresponding to the areas of diffusion restriction. Otherwise, there are scattered areas of hyperintense T2-weighted signal in the subcortical white matter within both hemispheres. No acute hemorrhage. No mass lesion. No chronic microhemorrhage or cerebral amyloid angiopathy. No hydrocephalus, age advanced atrophy or lobar predominant volume loss. No dural abnormality or extra-axial collection. Skull and upper cervical spine: The visualized skull base, calvarium, upper cervical spine and extracranial soft tissues are normal. Sinuses/Orbits: No fluid levels or advanced mucosal thickening. No mastoid effusion. Normal orbits. MRA HEAD FINDINGS Intracranial internal carotid arteries: Normal. Anterior cerebral arteries: Normal. Middle cerebral arteries: Normal. Posterior communicating arteries: Present on the right only. Posterior cerebral arteries: Normal. Basilar artery: Normal. Vertebral arteries: Left dominant. Normal. Superior cerebellar arteries: Normal. Anterior inferior cerebellar arteries: Normal. Posterior inferior cerebellar arteries: Normal. MRA NECK FINDINGS Aortic arch: Normal 3 vessel aortic branching pattern. The visualized subclavian arteries are normal. Right carotid system: There is no flow related enhancement within the proximal right internal carotid artery, including approximately a  4 cm segment. This is in keeping with the findings of the recent catheter angiogram. Left carotid system: Normal course and caliber without stenosis or evidence of dissection. Vertebral arteries: Codominant. Vertebral artery origins are not visualized. Vertebral arteries are normal in course and caliber to the vertebrobasilar confluence without stenosis or evidence of dissection. IMPRESSION: 1. Right MCA territory infarction involving the basal ganglia and the right insular and frontal operculum air cortices. This corresponds to the area of cord infarct demonstrated on the perfusion study of 10/09/2017. 2. Patent intracranial arteries with no high-grade stenosis, status post mechanical thrombectomy. 3. 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 4. No acute hemorrhage or mass effect. Of note, areas of hyperdensity seen on the head CT performed 10/09/2017 at 22:52 show no susceptibility weighted abnormality, thus most likely to indicate contrast staining. Electronically Signed   By: Deatra Robinson M.D.   On: 10/10/2017 20:25   Mr Brain Wo Contrast  Result Date: 10/10/2017 CLINICAL DATA:  Stroke follow-up.  Left-sided weakness. EXAM: MRI HEAD WITHOUT CONTRAST MRA HEAD WITHOUT CONTRAST MRA NECK WITHOUT CONTRAST TECHNIQUE: Multiplanar, multiecho pulse sequences of the brain and surrounding structures were obtained without intravenous contrast. Angiographic images of the Circle of Willis were obtained using MRA technique without intravenous contrast. Angiographic images of the neck were obtained using MRA technique without intravenous contrast. Carotid stenosis measurements (when applicable) are obtained utilizing NASCET criteria, using the distal internal carotid diameter as the denominator.  COMPARISON:  Head CT 10/09/2017 FINDINGS: MRI HEAD FINDINGS Brain: The midline structures are normal. There is abnormal diffusion restriction  within the right MCA territory, predominantly involving the posterior right lentiform nucleus, right caudate body and the right insular and frontal operculum cortices. There is hyperintense T2-weighted signal corresponding to the areas of diffusion restriction. Otherwise, there are scattered areas of hyperintense T2-weighted signal in the subcortical white matter within both hemispheres. No acute hemorrhage. No mass lesion. No chronic microhemorrhage or cerebral amyloid angiopathy. No hydrocephalus, age advanced atrophy or lobar predominant volume loss. No dural abnormality or extra-axial collection. Skull and upper cervical spine: The visualized skull base, calvarium, upper cervical spine and extracranial soft tissues are normal. Sinuses/Orbits: No fluid levels or advanced mucosal thickening. No mastoid effusion. Normal orbits. MRA HEAD FINDINGS Intracranial internal carotid arteries: Normal. Anterior cerebral arteries: Normal. Middle cerebral arteries: Normal. Posterior communicating arteries: Present on the right only. Posterior cerebral arteries: Normal. Basilar artery: Normal. Vertebral arteries: Left dominant. Normal. Superior cerebellar arteries: Normal. Anterior inferior cerebellar arteries: Normal. Posterior inferior cerebellar arteries: Normal. MRA NECK FINDINGS Aortic arch: Normal 3 vessel aortic branching pattern. The visualized subclavian arteries are normal. Right carotid system: There is no flow related enhancement within the proximal right internal carotid artery, including approximately a 4 cm segment. This is in keeping with the findings of the recent catheter angiogram. Left carotid system: Normal course and caliber without stenosis or evidence of dissection. Vertebral arteries: Codominant. Vertebral artery origins are not visualized. Vertebral arteries are normal in course and caliber to the vertebrobasilar confluence without stenosis or evidence of dissection. IMPRESSION: 1. Right MCA territory  infarction involving the basal ganglia and the right insular and frontal operculum air cortices. This corresponds to the area of cord infarct demonstrated on the perfusion study of 10/09/2017. 2. Patent intracranial arteries with no high-grade stenosis, status post mechanical thrombectomy. 3. 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 4. No acute hemorrhage or mass effect. Of note, areas of hyperdensity seen on the head CT performed 10/09/2017 at 22:52 show no susceptibility weighted abnormality, thus most likely to indicate contrast staining. Electronically Signed   By: Deatra RobinsonKevin  Herman M.D.   On: 10/10/2017 20:25      DISCUSSION: This is a 61 year old who presented the evening of 12/10 with left hemiplegia.  He was found on CTA to have a right MCA and ICA occlusion and underwent thrombectomy and carotid stenting.   ASSESSMENT / PLAN:  PULMONARY A: He was intubated for airway protection and for sedation during thrombectomy, extubation has been well-tolerated and he has no overt clinical evidence of aspiration.    CARDIOVASCULAR A: He has a history of chest pain,  but this is not an active problem.   NEUROLOGIC A: Status post right MCA and ICA thrombectomy. Remarkably nonfocal after his thrombectomy and stenting.   A statin was added, further stroke workup per neurology.  He is stable for transfer out of intensive care the critical care service will not routinely follow after transfer.  Penny PiaWJ Kristyanna Barcelo, MD Pulmonary and Critical Care Medicine [facility] Pager: [phone]  10/11/2017, 9:29 AM

## 2017-10-11 NOTE — Discharge Summary (Signed)
Stroke Discharge Summary  Patient ID: Raymond Lowery   MRN: [id number]      DOB: 04-16-1956  Date of Admission: 10/09/2017 Date of Discharge: 10/11/2017  Attending Physician:  Micki Riley, MD, Stroke MD Consultant(s):   Treatment Team:  Stroke, Md, MD pulmonary/intensive care ,Interventional Radiology Patient's PCP:  No primary care provider on file.  DISCHARGE DIAGNOSIS: Active Problems:   Right MCA infarct due to right MCA occlusion due to artert to artery embolism from proximal right ICA stenosis treated with mechanical embolectomy  With complete trecanalization of right MCA and with rescue right ICA stenting   Acute hypoxemic respiratory failure (HCC) Intracranial Stenosis s/p IC stent Leukocytosis Hypocalcemia Hypertension Hyperlipidemia Tobacco Abuse  History reviewed. No pertinent past medical history. Past Surgical History:  Procedure Laterality Date  . IR ANGIO EXTRACRAN SEL COM CAROTID INNOMINATE UNI L MOD SED  10/09/2017  . IR ANGIO VERTEBRAL SEL SUBCLAVIAN INNOMINATE UNI R MOD SED  10/09/2017  . IR INTRAVSC STENT CERV CAROTID W/O EMB-PROT MOD SED INC ANGIO  10/09/2017  . IR PERCUTANEOUS ART THROMBECTOMY/INFUSION INTRACRANIAL INC DIAG ANGIO  10/09/2017  . RADIOLOGY WITH ANESTHESIA N/A 10/09/2017   Procedure: RADIOLOGY WITH ANESTHESIA;  Surgeon: Radiologist, Medication, MD;  Location: [facility] OR;  Service: Radiology;  Laterality: N/A;    Allergies as of 10/11/2017   Not on File     Medication List    TAKE these medications   aspirin 325 MG tablet Take 1 tablet (325 mg total) by mouth daily with breakfast. Start taking on:  10/12/2017 What changed:    when to take this  reasons to take this   atorvastatin 40 MG tablet Commonly known as:  LIPITOR Take 1 tablet (40 mg total) by mouth daily at 6 PM.   clopidogrel 75 MG tablet Commonly known as:  PLAVIX Take 1 tablet (75 mg total) by mouth daily with breakfast. Start taking on:  10/12/2017    sildenafil 25 MG tablet Commonly known as:  VIAGRA Take 25 mg by mouth daily as needed for erectile dysfunction.      LABORATORY STUDIES CBC    Component Value Date/Time   WBC 8.7 10/11/2017 0438   RBC 4.02 (L) 10/11/2017 0438   HGB 12.7 (L) 10/11/2017 0438   HCT 38.5 (L) 10/11/2017 0438   PLT 185 10/11/2017 0438   MCV 95.8 10/11/2017 0438   MCH 31.6 10/11/2017 0438   MCHC 33.0 10/11/2017 0438   RDW 13.9 10/11/2017 0438   LYMPHSABS 2.5 10/11/2017 0438   MONOABS 0.6 10/11/2017 0438   EOSABS 0.1 10/11/2017 0438   BASOSABS 0.0 10/11/2017 0438   CMP    Component Value Date/Time   NA 136 10/11/2017 0438   K 3.6 10/11/2017 0438   CL 108 10/11/2017 0438   CO2 23 10/11/2017 0438   GLUCOSE 86 10/11/2017 0438   BUN 5 (L) 10/11/2017 0438   CREATININE 0.60 (L) 10/11/2017 0438   CALCIUM 7.9 (L) 10/11/2017 0438   PROT 7.1 10/09/2017 1749   ALBUMIN 3.9 10/09/2017 1749   AST 25 10/09/2017 1749   ALT 29 10/09/2017 1749   ALKPHOS 62 10/09/2017 1749   BILITOT 0.8 10/09/2017 1749   GFRNONAA >60 10/11/2017 0438   GFRAA >60 10/11/2017 0438  IONIZED CALCIUM = 1.09  10/09/17 - ONE DOSE OF CA GLUCONATE COAGS Lab Results  Component Value Date   INR 0.95 10/09/2017   Lipid Panel    Component Value Date/Time   CHOL 121  10/10/2017 0337   TRIG 123 10/10/2017 0337   HDL 27 (L) 10/10/2017 0337   CHOLHDL 4.5 10/10/2017 0337   VLDL 25 10/10/2017 0337   LDLCALC 69 10/10/2017 0337   HgbA1C  Lab Results  Component Value Date   HGBA1C 5.3 10/10/2017   Urinalysis    Component Value Date/Time   COLORURINE YELLOW 11/03/2010 0019   APPEARANCEUR CLEAR 11/03/2010 0019   LABSPEC >1.046 (H) 11/03/2010 0019   PHURINE 6.0 11/03/2010 0019   BILIRUBINUR NEGATIVE 11/03/2010 0019   KETONESUR NEGATIVE 11/03/2010 0019   PROTEINUR NEGATIVE 11/03/2010 0019   UROBILINOGEN 0.2 11/03/2010 0019   NITRITE NEGATIVE 11/03/2010 0019   LEUKOCYTESUR  11/03/2010 0019    NEGATIVE MICROSCOPIC NOT DONE ON  URINES WITH NEGATIVE PROTEIN, BLOOD, LEUKOCYTES, NITRITE, OR GLUCOSE <1000 mg/dL.   Urine Drug Screen No results found for: LABOPIA, COCAINSCRNUR, LABBENZ, AMPHETMU, THCU, LABBARB  Alcohol Level No results found for: [facility]  SIGNIFICANT DIAGNOSTIC STUDIES CTA Head/Neck & Cerebral Perfusion W Contrast Result Date: 10/09/2017 IMPRESSION: 1. Emergent large vessel occlusion of the right middle cerebral artery at its origin, in addition to long segment occlusion/critical stenosis of the right internal carotid artery beginning at the carotid bifurcation with a CT angiographic string sign extending to the skullbase, where the artery is completely occluded. Enhancement at the terminal aspect of the right internal carotid artery the collateral flow from the anterior communicating and right posterior communicating arteries. 2. Expansile thrombus within the M1 segment of the right middle cerebral artery measures approximately 2 cm. Poor collateralization of the right MCA territory. 3. With perfusion findings indicating 17 mL infarction volume within the right insula and adjacent centrum semiovale, with 141 mL of ischemic penumbra occupying the remainder of the right middle cerebral artery territory. 4. No hemodynamically significant stenosis of the left internal carotid artery or vertebral arteries.  Portable Chest Xray Result Date: 10/10/2017 IMPRESSION: Unchanged bibasilar airspace opacities which may reflect aspiration or infectious pneumonitis.   Dg Chest Port 1 View Result Date: 10/09/2017 IMPRESSION: 1. Satisfactory endotracheal tube position. 2. Further advancement of the patient's gastric tube by at least 4-5 cm is recommended as the side-port is approximately 1.9 cm above the GE junction. 3. Mild interstitial edema with patchy bilateral lower lobe airspace opacities suspicious for aspiration pneumonia.Chronic left seventh rib fracture.  Ct Head Code Stroke Wo Contrast Result Date:  10/09/2017 IMPRESSION: 1. No acute hemorrhage. 2. Hypoattenuation within the right insula with hyperdense appearance of the right middle cerebral artery, consistent with acute infarct. 3. ASPECTS is 9.   Echocardiogram:  Study Conclusions - Left ventricle: The cavity size was normal. Wall thickness was normal. Systolic function was normal. The estimated ejection fraction was in the range of 55% to 60%. Wall motion was normal; there were no regional wall motion abnormalities. Left ventricular diastolic function parameters were normal. Impressions: - Normal study.  MRI/MRA Head/Neck :   10/10/17 IMPRESSION: 1. Right MCA territory infarction involving the basal ganglia and the right insular and frontal operculum air cortices. This corresponds to the area of cord infarct demonstrated on the perfusion study of 10/09/2017. 2. Patent intracranial arteries with no high-grade stenosis, status post mechanical thrombectomy. 3. 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 4. No acute hemorrhage or mass effect. Of note, areas of hyperdensity seen on the head CT performed 10/09/2017 at 22:52 show no susceptibility weighted abnormality, thus most likely to indicate contrast staining.  B/L Carotid Duplex 10/11/17  Bilateral 1-39% ICA stenosis, antegrade vertebral flow. Right stent appears patent.     HISTORY OF PRESENT ILLNESS  &  HOSPITAL COURSE Admission history; Evert KohlDamon Shane [name] a 61 y.o.malewith a history of tobacco abuse who presents with left sided weakness. He had transient symptoms this morning and subsequently improved. He actually worked for most of the day, but then hais wife saw him reaching with his right arm because he could not use his left arm well.She therefore called 911. She states that his left side has seemed to get somewhat better in the interim, but he still has significant  deficits.  [id number] tpa given?: no,outside of window.  ASSESSMENT: Mr. Tobie PoetDamon Shane Gonzaga is a 61 y.o. male with PMH of Tobacco Abuse admitted with complaints of acute onset of Left sided weakness. CT perfusion-shows large area of penumbra with right M1 occlusion,suspected secondary to carotid disease. He is going to IR for reperfusion. Though he had significant improvement in the interim between his initial symptoms and subsequent worsening, with his neglect and fact that he already has CT change, I am concerned he may have had some injury this morning. I would therefore favor pursuing reperfusion with IR therapy rather than tPA  CEREBRAL ANGIOGRAM [[id number] (Custom)] S/P bilateral common carotid arteriograms and Rt vert angiogram,followed by comlete revascularizationof occluded RT MCA and terminal RT ICA with  x1 pass with solitaire 4mm x 40 mm retrieval device achieving a TICI 2b reperfusion and complete revascularization of acutelly occluded Rt ICA with stent assisted angioplasty And use of 1 mg of IA aggrastat. And12mg  of IA Integrelin For rescue acute intrastent aggregation With complete clearance. Also gave loading dose ofplavix600mg  and 325 mg of aspirin via oro gastric tube.  Acute Right MCA infarct with Right M1 occlusion s/p Angioplasty/Stent :  Suspected Etiology: right M1 occlusion, suspect secondary to carotid disease Resultant Symptoms: Right gaze preference, partial left upper field cut, Left facial droop, left hemineglect,  Left sided weakness Stroke Risk Factors: carotid stenosis and smoking Other Stroke Risk Factors: Advanced age, Cigarette smoker,   Outstanding Stroke Work-up Studies:   Work-up completed  10/10/17: Neuro exam remains stable.  Remains intubated and sedated but arouses easily to voice. Following all commands.  Long discussion with family at bedside by Dr. Pearlean BrownieSethi and brain imaging reviewed.  Plan to remove IR sheath and extubate later today.   MRI pending overnight.  Patient presented with right M1 occlusion secondary to embolization from high-grade proximal right ICA stenosis requiring emergent right carotid stenting followed by mechanical embolectomy with good recanalization. Maintained strict control of blood pressure. Discontinued groin arterial sheath. Extubated and did well Checked MRI scan of the brain. Continued aspirin and Plavix due to/stent. Long discussion with the bedside with the patient's wife and family members and answered questions. Discussed with Dr. Warren LacyWalter Gray.  10/11/17: Patient extubated yesterday afternoon. Passed SLP eval. PT/OT evaluation completed and requires only outpatient therapies. Anxious to be discharged home today. Long discussion wit patient and wife regarding POC, follow up appointments and Stroke-AFIB trial.   PLAN  10/11/2017: Continue Aspirin/ Plavix/ Statin Discharge home today  Ongoing aggressive stroke risk factor management Patient counseled to be compliant withhisantithrombotic medications [facility] 6 weeks  INTRACRANIAL Atherosclerosis &Stenosis s/p IC stent: On DAPT, continue for 6 months and then Plavix alone Follow up with IR - Dr Corliss Skainseveshwar  DYSPHAGIA: Passed SLP swallow evaluation  Intubated for airway protection/sedation for IR procedure -  Extubated Leukocytosis -Resolved Hypocalcemia - Resolved CXR - Aspiration vs Pneumonia - Remains afebrile , no cough/sputum Medical Issues Management per CCM team - appreciate assistance   HYPERTENSION: Stable Long term BP goal normotensive. No need to start  B/P medications at this time, PCP to follow up Patient will need to keep B/P log for PCP Home Meds: NONE  HYPERLIPIDEMIA: Labs(Brief)          Component Value Date/Time   CHOL 121 10/10/2017 0337   TRIG 123 10/10/2017 0337   HDL 27 (L) 10/10/2017 0337   CHOLHDL 4.5 10/10/2017 0337   VLDL 25 10/10/2017 0337   LDLCALC 69  10/10/2017 0337    Home Meds:  NONE LDL  goal < 70 Started on Lipitor to 10 mg daily Continue statin at discharge  R/O DIABETES: RecentLabs       Lab Results  Component Value Date   HGBA1C 5.3 10/10/2017    HgbA1c goal < 7.0  TOBACCO ABUSE Current smoker Smoking cessation counseling will be provided once alert Nicotine patch provided -PRN  DISCHARGE EXAM Blood pressure 107/62, pulse 60, temperature 97.8 F (36.6 C), temperature source Oral, resp. rate 19, height 5\' 10"  (1.778 m), weight 73.4 kg (161 lb 13.1 oz), SpO2 97 %. General - Well nourished, well developed middle aged Caucasian male, in no apparent distress Respiratory - Lungs clear bilaterally. No wheezing. Cardiovascular - Regular rate and rhythm  Neuro: Mental Status: Patient is awake, alert, oriented x 3. Follows all commands. Speaking normally Left hemi -neglect - much less significant today Cranial Nerves: II: Visual Fieldswith partial left upper field cut. Pupils are equal, round, and reactive to light.  III,IV, NW:GNFAOVI:Right gaze preference, but does cross midline to the left V: Facial sensation is symmetric to temperature VII: Facial movement isdecreased on the left VIII: hearing is intact to voice X: Uvula elevates symmetrically XI: Shoulder shrug is symmetric. XII: tongue is midline without atrophy or fasciculations.  Motor: Tone is normal. Bulk is normal. 5/5 strength was presenton the right side, 4/5 in the left Sensory: Sensation isdecreased on the left, but patient does not seem aware of this, he does extinguish Cerebellar: not tested. Walked in hallways with PT with walker/cane. Slightly ataxic gait but safe to walk with supervision  Discharge Diet   Diet full liquid Room service appropriate? Yes; Fluid consistency: Thin liquids  DISCHARGE PLAN  Disposition:  HOME Prior Home Stroke Medications: No antithrombotic  Discharge Stroke Meds:  Please discharge patient on: aspirin 325 mg  daily, clopidogrel 75 mg daily and Lipitor 10 mg   Disposition:       HOME Therapy Recs:  OUT PATIENT PT/OT Follow Recs:  No primary care provider on file. - Case management aware - Setting him up for an appointment for follow up care Follow up with Dr Pearlean BrownieSethi and Dr Titus Dubinevashwar in 6 weeks  FAMILYUPDATES: family at bedside  TEAM UPDATES: Micki RileySethi, Darla Mcdonald S, MD  Beryl MeagerMary A Costello, ANP-C Stroke Neurology Team 10/11/2017 11:27 AM  Greater than 40 minutes were spent preparing discharge. I have personally examined this patient, reviewed notes, independently viewed imaging studies, participated in medical decision making and plan of care.ROS completed by me personally and pertinent positives fully documented  I have made any additions or clarifications directly to the above note. Agree with note above.    Delia HeadyPramod Meredith Kilbride, MD Medical Director [facility] Pager: [phone] 10/11/2017 6:05 PM

## 2017-10-11 NOTE — Evaluation (Signed)
Occupational Therapy Evaluation Patient Details Name: Raymond Lowery MRN: [id number] DOB: 08/27/1956 Today's Date: 10/11/2017    History of Present Illness 61 y.o. male presenting with L side weakness. MRI showing abnormal diffusion restriction within the right MCA territory, predominantly involving the posterior right lentiform nucleus, right caudate body and the right insular and frontal operculum cortices.   Clinical Impression   PTA, pt was living with his wife and was independent and working as a Merchandiser, retail. Currently, pt requires Min Guard for ADLs and functional mobility using RW. Pt presenting with decreased balance, slow processing, and decrease UE coordination. Recommend dc home with follow up from neuro outpatient OT to optimize return to PLOF and working. Will continue to follow acutely to facilitate safe dc.     Follow Up Recommendations  Outpatient OT(Neuro OP OT)    Equipment Recommendations  None recommended by OT    Recommendations for Other Services PT consult     Precautions / Restrictions Precautions Precautions: Fall Restrictions Weight Bearing Restrictions: No      Mobility Bed Mobility Overal bed mobility: Needs Assistance Bed Mobility: Supine to Sit     Supine to sit: Supervision     General bed mobility comments: Pt OOB with PT upon arrival  Transfers Overall transfer level: Needs assistance Equipment used: None Transfers: Sit to/from Stand Sit to Stand: Min guard         General transfer comment: pt with good technique, mildly unsteady upon standing, noted with lateral sway requiring min guard to maintain balance    Balance Overall balance assessment: Needs assistance Sitting-balance support: No upper extremity supported;Feet supported Sitting balance-Leahy Scale: Good     Standing balance support: No upper extremity supported;During functional activity Standing balance-Leahy Scale: Fair                              ADL either performed or assessed with clinical judgement   ADL Overall ADL's : Needs assistance/impaired Eating/Feeding: Supervision/ safety;Set up;Sitting Eating/Feeding Details (indicate cue type and reason): Pt able to managing suger packets and containers with lids.  Grooming: Wash/dry hands;Set up;Supervision/safety;Standing;Cueing for sequencing Grooming Details (indicate cue type and reason): Pt with slower processing and requires increased time.  Upper Body Bathing: Set up;Supervision/ safety;Sitting   Lower Body Bathing: Min guard;Sit to/from stand   Upper Body Dressing : Set up;Supervision/safety;Sitting   Lower Body Dressing: Min guard;Sit to/from stand   Toilet Transfer: Solicitor;Ambulation Toilet Transfer Details (indicate cue type and reason): Pt performing troilet transfer to regular height toilet with MIn Guard A.         Functional mobility during ADLs: Min guard;Cueing for safety General ADL Comments: Pt demonstrating with decreased processing, coordination, and balance. Pt with L lateral lean in standing as seen during functional mobility and ADLs in standing. Pt requiring increased time to complete ADL tasks due to slower processing.     Vision Baseline Vision/History: Wears glasses Wears Glasses: At all times Patient Visual Report: No change from baseline Vision Assessment?: Yes Eye Alignment: Within Functional Limits Ocular Range of Motion: Within Functional Limits Tracking/Visual Pursuits: Able to track stimulus in all quads without difficulty     Perception     Praxis      Pertinent Vitals/Pain Pain Assessment: No/denies pain     Hand Dominance Right   Extremity/Trunk Assessment Upper Extremity Assessment Upper Extremity Assessment: LUE deficits/detail LUE Deficits / Details: grossly 4/5, fine motor/dexterity impairment. Poor  finger opposition.    Lower Extremity Assessment Lower Extremity Assessment: LLE  deficits/detail LLE Deficits / Details: grossly 4/5 LLE Coordination: decreased gross motor   Cervical / Trunk Assessment Cervical / Trunk Assessment: Normal   Communication Communication Communication: Expressive difficulties   Cognition Arousal/Alertness: Awake/alert Behavior During Therapy: Flat affect Overall Cognitive Status: Impaired/Different from baseline Area of Impairment: Attention;Memory;Problem solving;Following commands                   Current Attention Level: Selective Memory: Decreased short-term memory Following Commands: Follows one step commands with increased time;Follows multi-step commands with increased time     Problem Solving: Slow processing;Difficulty sequencing;Requires verbal cues;Requires tactile cues General Comments: Pt with slow processing and requires increased time to complete tasks and answer questions. Pt able to answer money management questions, but requires increased time and 1 VC to recall amount spent.   General Comments  Wife present throughout session    Exercises     Shoulder Instructions      Home Living Family/patient expects to be discharged to:: Private residence Living Arrangements: Spouse/significant other Available Help at Discharge: Family;Available PRN/intermittently Type of Home: House Home Access: Stairs to enter Entergy CorporationEntrance Stairs-Number of Steps: 3-4 Entrance Stairs-Rails: Right;Left Home Layout: One level     Bathroom Shower/Tub: Chief Strategy OfficerTub/shower unit   Bathroom Toilet: Standard     Home Equipment: None          Prior Functioning/Environment Level of Independence: Independent        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, retailsupervisor, 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, communitygas lines        OT Problem List: Decreased activity tolerance;Decreased coordination;Impaired balance (sitting and/or standing);Decreased safety awareness;Decreased knowledge of precautions;Decreased knowledge of use of DME or AE;Impaired UE functional  use      OT Treatment/Interventions: Self-care/ADL training;Therapeutic exercise;Energy conservation;DME and/or AE instruction;Therapeutic activities;Patient/family education    OT Goals(Current goals can be found in the care plan section) Acute Rehab OT Goals Patient Stated Goal: Go home OT Goal Formulation: With patient/family Time For Goal Achievement: 10/25/17 Potential to Achieve Goals: Good ADL Goals Additional ADL Goal #1: Pt will perform ADLs at Mod I level Additional ADL Goal #2: Pt will perform four step trail making task with 1-2 VCs  OT Frequency: Min 2X/week   Barriers to D/C:            Co-evaluation              AM-PAC PT "6 Clicks" Daily Activity     Outcome Measure Help from another person eating meals?: A Little Help from another person taking care of personal grooming?: A Little Help from another person toileting, which includes using toliet, bedpan, or urinal?: A Little Help from another person bathing (including washing, rinsing, drying)?: A Little Help from another person to put on and taking off regular upper body clothing?: A Little Help from another person to put on and taking off regular lower body clothing?: A Little 6 Click Score: 18   End of Session Equipment Utilized During Treatment: Gait belt Nurse Communication: Mobility status;Precautions  Activity Tolerance: Patient tolerated treatment well Patient left: in chair;with call bell/phone within reach;with family/visitor present  OT Visit Diagnosis: Unsteadiness on feet (R26.81);Other abnormalities of gait and mobility (R26.89);Muscle weakness (generalized) (M62.81);Other symptoms and signs involving cognitive function                Time: [id number] OT Time Calculation (min): 18 min Charges:  OT General Charges $OT Visit:  1 Visit OT Evaluation $OT Eval Moderate Complexity: 1 Mod G-Codes:     Ananya Mccleese MSOT, OTR/L Acute Rehab Pager: [phone] Office: [phone]  Theodoro GristCharis  M Arael Piccione 10/11/2017, 1:35 PM

## 2017-10-11 NOTE — Care Management Note (Signed)
Case Management Note  Patient Details  Name: Raymond Lowery MRN: [id number] Date of Birth: 05-Jul-1956  Subjective/Objective:     61 y.o. male presenting with L side weakness. MRI showing abnormal diffusion restriction within the right MCA territory, predominantly involving the posterior right lentiform nucleus, right caudate body and the right insular and frontal operculum cortices.  PTA, pt independent, lives with spouse.                 Action/Plan: PT/OT recommending OP follow up.  [facility], and information placed on AVS in Epic.  Pt states [facility] [place], but this PCP left the practice.  Wife plans to make him appt with one of the other MDs in the practice within one to two weeks of dc.  Information placed on AVS.    Expected Discharge Date:  10/11/17               Expected Discharge Plan:  OP Rehab  In-House Referral:     Discharge planning Services  CM Consult  Post Acute Care Choice:    Choice offered to:     DME Arranged:    DME Agency:     HH Arranged:    HH Agency:     Status of Service:  Completed, signed off  If discussed at [employer] Length of Stay Meetings, dates discussed:    Additional Comments:  Raymond Lowery, Raymond Galyean M, RN 10/11/2017, 3:54 PM

## 2017-10-11 NOTE — Progress Notes (Signed)
Discharge orders received, pt for discharge home today, IV D/C with dressing CDI to R groin.  D/C instructions and Rx given with verbalized understanding.  Family at bedside to assist pt with discharge. Staff brought pt downstairs via wheelchair.  

## 2017-10-11 NOTE — Progress Notes (Signed)
Lab called regarding collection of STAT p2y12. Phlebotomist en route

## 2017-10-11 NOTE — Research (Signed)
STROKE-AF Research study protocol presented to patient and wife. Questions encouraged and answered. ICF left for review. Patients Wife given my contact information. She states patient is ready to go home. I instructed them that they have 10 days form "last known normal" (10/19/2017) to make decision. I encouraged them to make the decision with enough time to schedule Loop implant with Dr. Johney FrameAllred if randomized to Loop. Patient and wife not sure this is something they are willing to participate in.

## 2017-10-11 NOTE — Evaluation (Signed)
Physical Therapy Evaluation Patient Details Name: Raymond Lowery MRN: [id number] DOB: 1956/06/01 Today's Date: 10/11/2017   History of Present Illness  61 y.o. male presenting with L side weakness. MRI showing abnormal diffusion restriction within the right MCA territory, predominantly involving the posterior right lentiform nucleus, right caudate body and the right insular and frontal operculum cortices.  Clinical Impression  Pt presenting with mild L sided weakness, impaired balance, impaired fine motor coordination, and delayed processing but functioning at min guard level. Recommend outpt neuro PT and initial 24/7 assist upon d/c due to increased falls risk. Acute PT to con't to follow.    Follow Up Recommendations Outpatient PT;Supervision/Assistance - 24 hour(initially for a week)    Equipment Recommendations  None recommended by PT    Recommendations for Other Services       Precautions / Restrictions Precautions Precautions: Fall Restrictions Weight Bearing Restrictions: No      Mobility  Bed Mobility Overal bed mobility: Needs Assistance Bed Mobility: Supine to Sit     Supine to sit: Supervision     General bed mobility comments: pt initiated transfer, HOB elevated, used bed rail, no physical assist needed  Transfers Overall transfer level: Needs assistance Equipment used: None Transfers: Sit to/from Stand Sit to Stand: Min guard         General transfer comment: pt with good technique, mildly unsteady upon standing, noted with lateral sway requiring min guard to maintain balance  Ambulation/Gait Ambulation/Gait assistance: Min guard Ambulation Distance (Feet): 250 Feet Assistive device: None Gait Pattern/deviations: Step-through pattern;Narrow base of support;Staggering left Gait velocity: slow Gait velocity interpretation: Below normal speed for age/gender General Gait Details: pt vearing left but no overt bumping into objects on the left. Pt  mildly unsteady but no overt episode of LOB requiring assist to recovery.  Stairs            Wheelchair Mobility    Modified Rankin (Stroke Patients Only)       Balance Overall balance assessment: Needs assistance Sitting-balance support: No upper extremity supported;Feet supported Sitting balance-Leahy Scale: Good     Standing balance support: No upper extremity supported;During functional activity Standing balance-Leahy Scale: Fair                               Pertinent Vitals/Pain Pain Assessment: No/denies pain    Home Living Family/patient expects to be discharged to:: Private residence Living Arrangements: Spouse/significant other Available Help at Discharge: Family;Available PRN/intermittently Type of Home: House Home Access: Stairs to enter Entrance Stairs-Rails: Doctor, general practiceight;Left Entrance Stairs-Number of Steps: 3-4 Home Layout: One level Home Equipment: None      Prior Function Level of Independence: Independent         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, retailsupervisor, 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, advertisinggas lines     Hand Dominance   Dominant Hand: Right    Extremity/Trunk Assessment   Upper Extremity Assessment Upper Extremity Assessment: LUE deficits/detail LUE Deficits / Details: grossly 4/5, fine motor/dexterity impairment    Lower Extremity Assessment Lower Extremity Assessment: LLE deficits/detail LLE Deficits / Details: grossly 4/5 LLE Coordination: decreased gross motor    Cervical / Trunk Assessment Cervical / Trunk Assessment: Normal  Communication   Communication: Expressive difficulties  Cognition Arousal/Alertness: Awake/alert Behavior During Therapy: Flat affect Overall Cognitive Status: Impaired/Different from baseline Area of Impairment: Attention;Memory;Problem solving  Current Attention Level: Selective Memory: Decreased short-term memory       Problem Solving: Slow processing;Difficulty  sequencing;Requires verbal cues;Requires tactile cues General Comments: delayed processing      General Comments      Exercises     Assessment/Plan    PT Assessment Patient needs continued PT services  PT Problem List Decreased strength;Decreased activity tolerance;Decreased balance;Decreased mobility;Decreased coordination;Decreased cognition;Decreased knowledge of use of DME       PT Treatment Interventions DME instruction;Gait training;Stair training;Functional mobility training;Therapeutic activities;Therapeutic exercise;Balance training;Neuromuscular re-education;Cognitive remediation    PT Goals (Current goals can be found in the Care Plan section)  Acute Rehab PT Goals Patient Stated Goal: home PT Goal Formulation: With patient/family Time For Goal Achievement: 10/18/17 Potential to Achieve Goals: Good Additional Goals Additional Goal #1: Pt to score > 19 on DGI to indicate minimal falls risk.    Frequency Min 4X/week   Barriers to discharge Decreased caregiver support spouse works    Co-evaluation               AM-PAC PT "6 Clicks" Daily Activity  Outcome Measure Difficulty turning over in bed (including adjusting bedclothes, sheets and blankets)?: A Little Difficulty moving from lying on back to sitting on the side of the bed? : A Little Difficulty sitting down on and standing up from a chair with arms (e.g., wheelchair, bedside commode, etc,.)?: A Little Help needed moving to and from a bed to chair (including a wheelchair)?: A Little Help needed walking in hospital room?: A Little Help needed climbing 3-5 steps with a railing? : A Little 6 Click Score: 18    End of Session Equipment Utilized During Treatment: Gait belt Activity Tolerance: Patient tolerated treatment well Patient left: (standing in room with OT) Nurse Communication: Mobility status PT Visit Diagnosis: Unsteadiness on feet (R26.81)    Time: [id number] PT Time Calculation (min)  (ACUTE ONLY): 30 min   Charges:   PT Evaluation $PT Eval Moderate Complexity: 1 Mod     PT G CodesLewis Lowery:        Raymond Lowery, PT, DPT Pager #: [phone] Office #: [phone]   Raymond Lowery 10/11/2017, 11:00 AM

## 2017-10-11 NOTE — Progress Notes (Signed)
*  PRELIMINARY RESULTS* Vascular Ultrasound Carotid Duplex (Doppler) has been completed.  Preliminary findings: Bilateral 1-39% ICA stenosis, antegrade vertebral flow. Right stent appears patent.   Chauncey FischerCharlotte C Kristopher Attwood 10/11/2017, 2:20 PM

## 2017-10-11 NOTE — Discharge Instructions (Signed)
No work for at least 2-3 weeks No driving until cleared by PCP Take B/P daily in AM and PM and keep log to give to PCP

## 2017-10-12 LAB — VAS US CAROTID
LCCADSYS: -68 cm/s
LEFT ECA DIAS: -12 cm/s
LEFT VERTEBRAL DIAS: -12 cm/s
LICADSYS: -70 cm/s
LICAPDIAS: -23 cm/s
Left CCA dist dias: -17 cm/s
Left CCA prox dias: 17 cm/s
Left CCA prox sys: 80 cm/s
Left ICA dist dias: -19 cm/s
Left ICA prox sys: -74 cm/s
RCCADSYS: -77 cm/s
RCCAPDIAS: 13 cm/s
RIGHT CCA MID DIAS: 15 cm/s
RIGHT ECA DIAS: -23 cm/s
RIGHT VERTEBRAL DIAS: -19 cm/s
Right CCA prox sys: 80 cm/s

## 2017-10-12 NOTE — Progress Notes (Signed)
Late entry for missed Modified Rankin Score   10/11/17 1105  Modified Rankin (Stroke Patients Only)  Pre-Morbid Rankin Score 0  Modified Rankin [address]  Raymond Lowery Raymond Lowery, [place]PT  [phone] 10/12/2017

## 2017-10-13 ENCOUNTER — Encounter: Payer: Self-pay | Admitting: Physical Therapy

## 2017-10-13 ENCOUNTER — Other Ambulatory Visit (HOSPITAL_COMMUNITY): Payer: Self-pay | Admitting: Interventional Radiology

## 2017-10-13 ENCOUNTER — Telehealth: Payer: Self-pay | Admitting: Neurology

## 2017-10-13 ENCOUNTER — Other Ambulatory Visit: Payer: Self-pay

## 2017-10-13 ENCOUNTER — Ambulatory Visit: Payer: 59 | Attending: Neurology | Admitting: Physical Therapy

## 2017-10-13 DIAGNOSIS — I639 Cerebral infarction, unspecified: Secondary | ICD-10-CM

## 2017-10-13 DIAGNOSIS — R41841 Cognitive communication deficit: Secondary | ICD-10-CM | POA: Insufficient documentation

## 2017-10-13 DIAGNOSIS — R2681 Unsteadiness on feet: Secondary | ICD-10-CM | POA: Diagnosis present

## 2017-10-13 DIAGNOSIS — I69354 Hemiplegia and hemiparesis following cerebral infarction affecting left non-dominant side: Secondary | ICD-10-CM | POA: Diagnosis not present
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
## 2017-10-13 NOTE — Telephone Encounter (Signed)
Pt's wife called said discharge from the hospital on 10/11/17 states no work for 2-3 weeks. PCP/Dr Hamilton Caprihao Le [facility] [phone] said she will sign off for the pt to return to work but she would prefer it come from the neurologist. Please send letter to [employer] (p) [phone] (f) [phone] Attn: Mauri PoleJamie Everhart. She is asking if letter can be sent thru mychart to the pt also.

## 2017-10-14 NOTE — Telephone Encounter (Signed)
I returned call from answering service and spoke t0 patient`s wife . She wanted assurance that it was ok to take antibiotics for URTI  As prescribed by his primary MD and it  Would not interfere with his stroke medicines. I reassured her that this was fine

## 2017-10-16 ENCOUNTER — Ambulatory Visit: Payer: 59 | Admitting: Occupational Therapy

## 2017-10-16 ENCOUNTER — Encounter: Payer: Self-pay | Admitting: Occupational Therapy

## 2017-10-16 ENCOUNTER — Ambulatory Visit: Payer: 59 | Admitting: Physical Therapy

## 2017-10-16 DIAGNOSIS — I69354 Hemiplegia and hemiparesis following cerebral infarction affecting left non-dominant side: Secondary | ICD-10-CM
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
## 2017-10-17 ENCOUNTER — Telehealth: Payer: Self-pay | Admitting: Neurology

## 2017-10-17 ENCOUNTER — Other Ambulatory Visit: Payer: Self-pay

## 2017-10-17 DIAGNOSIS — R4189 Other symptoms and signs involving cognitive functions and awareness: Secondary | ICD-10-CM

## 2017-10-17 DIAGNOSIS — I639 Cerebral infarction, unspecified: Secondary | ICD-10-CM

## 2017-10-17 NOTE — Telephone Encounter (Signed)
Pt's wife called RN back. Pt is wanting to be seen sooner. I advised her 6 wk f/u is appropriate time frame. Please call to advise

## 2017-10-17 NOTE — Telephone Encounter (Signed)
[name] pts wife number [phone].

## 2017-10-17 NOTE — Telephone Encounter (Signed)
Rn call patients wife about letter for work. Rn ask wife how long does her husband want to be out of work. Pts wife stated her husband has a job that requires a lot. She also wanted her husband evaluate for speech therapy,and cognitive therapy. Pt was cleared by OT at outpatient therapy.. Rn stated a referral can be place to outpatient therapy for neuro rehab.  PTs wife stated she will talk with her husband about if he wanted to be out of work till January 1 or 2 per Dr. Pearlean BrownieSethi. The wife stated she will talk with her husband and call us back. Rn reminded pts wife that Dr.Sethi will be out of the office effective Dec 22, till January 14,2019. He is taking vacation,and is working in the hospital. Rn stated the letter needs to be fax before Friday. The wife will call us back today once she speaks with her husband.

## 2017-10-17 NOTE — Telephone Encounter (Signed)
He spoke to the patient's wife who expressed concerns about his cognitive issues in terms of Ms. pronouncing names as well as right and left confusion following his stroke. Patient wants to go back to work right away but wife is concerned about this. I recommend outpatient speech therapy cognitive evaluation prior to making a decision about him returning to work. She voiced understanding I have placed the speech eval ordered in epic

## 2017-10-18 NOTE — Telephone Encounter (Signed)
Rn spoke with Dr. Pearlean BrownieSethi if patient need letter. He stated pt will be seeing speech therapist about cognitive issues.Once pt sees the therapist there will be a determination on how long he should be out of work.

## 2017-10-19 ENCOUNTER — Telehealth: Payer: Self-pay | Admitting: *Deleted

## 2017-10-19 ENCOUNTER — Encounter: Payer: Self-pay | Admitting: Physical Therapy

## 2017-10-19 ENCOUNTER — Ambulatory Visit: Payer: 59 | Admitting: Physical Therapy

## 2017-10-19 DIAGNOSIS — I69354 Hemiplegia and hemiparesis following cerebral infarction affecting left non-dominant side: Secondary | ICD-10-CM

## 2017-10-19 DIAGNOSIS — R2681 Unsteadiness on feet: Secondary | ICD-10-CM

## 2017-10-19 NOTE — Patient Instructions (Signed)
WALKING  Walking is a great form of exercise to increase your strength, endurance and overall fitness.  A walking program can help you start slowly and gradually build endurance as you go.  Everyone's ability is different, so each person's starting point will be different.  You do not have to follow them exactly.  The are just samples. You should simply find out what's right for you and stick to that program.   In the beginning, you'll start off walking 1-2 times a day for short distances.  As you get stronger, you'll be walking further at just 1-2 times per day.  A. You Can Walk For A Certain Length Of Time Each Day    Walk 7 minutes 2 times per day.  Increase 2-3 minutes every 5-6 days (2 times per day).  Work up to 25-30 minutes (1-2 times per day).   Example:   Day 1-2 7 minutes 2  times per day   Day 7-8 8-10 minutes 1-2 times per day   Day 13-14 14-16 minutes 1-2 times per day   Please only do the exercises that your therapist has initialed and dated

## 2017-10-19 NOTE — Telephone Encounter (Signed)
Raymond Lowery stopped by the office to report the therapist detected an irregular heartbeat ranging from 57-68. She wants a call back to discuss this today.

## 2017-10-19 NOTE — Telephone Encounter (Signed)
Rn spoke with Dr. Pearlean BrownieSethi about pts wife stating pt had irregular heart beat from 57-68. DR. Pearlean BrownieSethi stated pt had a cardiac work up, and wore a monitor while hospitalized. Pt did not have any cardiac issues while hospitalized.

## 2017-10-19 NOTE — Telephone Encounter (Signed)
Rn call patient to discuss therapist at neuro rehab stated. Rn stated per Dr. Pearlean BrownieSethi had no cardiac issues in hospital last week. Rn stated exercise ,and therapy can change heart rate temporary per Dr. Pearlean BrownieSEthi. Pt also wore a monitor while hospitalized. Pt verbalized understanding.,

## 2017-10-20 ENCOUNTER — Telehealth: Payer: Self-pay | Admitting: Neurology

## 2017-10-20 DIAGNOSIS — Z0289 Encounter for other administrative examinations: Secondary | ICD-10-CM

## 2017-10-20 NOTE — Telephone Encounter (Signed)
Pt has called (had wife on line 1st but was unable to find a DPR so pt came on line,it was explained to pt that he needs to fill one out).  Pt stated that re: his Driving restriction he said he was told by Dr Pearlean BrownieSethi that his PCP could fill out the paperwork. Pt's PCP has informed him that Dr Pearlean BrownieSethi needs to send a form authorizing her to complete the driving restriction form for pt when it is time.  Pt PCP name is Dr Hamilton Caprihao-Le, Do(commonly called Dr Le)([facility] [phone] fax [phone].  Pt is aware that Dr Marlis EdelsonSethi's time is divided between this office and the hospital and with the holidays there may not be an immediate response.  Pt states they are fully aware that Dr Pearlean BrownieSethi may be taking some vacation time soon and will try to reach him via My Chart as well.
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
## 2017-10-20 NOTE — Telephone Encounter (Signed)
Message sent to Dr. Sethi. 

## 2017-10-20 NOTE — Telephone Encounter (Signed)
Rn call patient wife per Dr. Pearlean BrownieSethi. Rn stated because of the concerns for driving, pts therapy, working, pt needs to be seen sooner.Appt cancel for February 2019. Appt made 11/14/2017 Tuesday at 0130pm for 0100pm appt. Rn spoke with wife to verify this.   Rn also stated per Dr. Pearlean BrownieSEthi he does not want pt to drive, or work till November 14, 2017 on Tuesday. Pt's wife verbalized understanding. She will tell her husband about no driving or return to work until his appt. Dr.Sethi wants pt to continue therapy. Rn explain our office is in the same building as neuro rehab but different suite.

## 2017-10-20 NOTE — Telephone Encounter (Signed)
[facility] Message sent to Dr. Pearlean BrownieSEthi about driving, and paperwork.

## 2017-10-20 NOTE — Telephone Encounter (Signed)
Kindly call the patient's wife and informed her that I will try to see the patient sooner as a work in in January and not to drive or return to work until that visit

## 2017-10-26 ENCOUNTER — Ambulatory Visit: Payer: 59 | Admitting: Speech Pathology

## 2017-10-26 ENCOUNTER — Other Ambulatory Visit: Payer: Self-pay

## 2017-10-26 DIAGNOSIS — I69354 Hemiplegia and hemiparesis following cerebral infarction affecting left non-dominant side: Secondary | ICD-10-CM | POA: Diagnosis not present

## 2017-10-26 DIAGNOSIS — R41841 Cognitive communication deficit: Secondary | ICD-10-CM

## 2017-10-26 NOTE — Patient Instructions (Addendum)
  Fatigue will affect speech, processing speed  Sometimes, after a stroke, environments with high stimulation (restaurants, [employer], group conversations, parties, etc) can cause more fatigue and reduce attention/concentration as your brain attends to all of the noise, lights, conversations etc  Be aware of any fatigue or reduced concentration and take a break - listen to your body  Your work knowledge is not affected - however, your processing speed, ability to focus and concentrate with distractions can affect your work   I would suggest you start back to work with lighter work and half day schedule   Cognitive Activities you can do at home:   - Solitaire  - Majong  - Scrabble  - Chess/Checkers  - Crosswords (easy level)  - Education officer, communityUno  - Card Games  - Board Games  - Connect 4  - Simon  - the Memory Game  - Dominoes  - Backgammon

## 2017-10-26 NOTE — Therapy (Signed)
[facility] [address] [place], [place], [address] Phone: [phone]   Fax:  [phone]  Speech Language Pathology Evaluation  Patient Details  Name: Raymond Lowery MRN: [id number] Date of Birth: 1956-06-02 Referring Provider: Dr. Delia HeadyPramod Sethi   Encounter Date: 10/26/2017  End of Session - 10/26/17 1226    Visit Number  1    Number of Visits  1    Date for SLP Re-Evaluation  -- eval only    SLP Start Time  0933    SLP Stop Time   1015    SLP Time Calculation (min)  42 min    Activity Tolerance  Patient tolerated treatment well       No past medical history on file.  Past Surgical History:  Procedure Laterality Date  . IR ANGIO EXTRACRAN SEL COM CAROTID INNOMINATE UNI L MOD SED  10/09/2017  . IR ANGIO VERTEBRAL SEL SUBCLAVIAN INNOMINATE UNI R MOD SED  10/09/2017  . IR INTRAVSC STENT CERV CAROTID W/O EMB-PROT MOD SED INC ANGIO  10/09/2017  . IR PERCUTANEOUS ART THROMBECTOMY/INFUSION INTRACRANIAL INC DIAG ANGIO  10/09/2017  . RADIOLOGY WITH ANESTHESIA N/A 10/09/2017   Procedure: RADIOLOGY WITH ANESTHESIA;  Surgeon: Radiologist, Medication, MD;  Location: [facility];  Service: Radiology;  Laterality: N/A;    There were no vitals filed for this visit.  Subjective Assessment - 10/26/17 1218    Patient is accompained by:  Family member spouse Jola BabinskiKaylene    Currently in Pain?  No/denies         [facility] - 10/26/17 [id number]      SLP Visit Information   SLP Received On  10/26/17    Referring Provider  Dr. Delia HeadyPramod Sethi    Onset Date  10/09/17    Medical Diagnosis  CVA      Subjective   Subjective  "If the doctor would let me, I'd go back to work tomorrow"    Patient/Family Stated Goal  To get back to work so I can keep my position      General Information   HPI  61 y.o. male presenting with L side weakness. MRI showing abnormal diffusion restriction within the right MCA territory, predominantly  involving the posterior right lentiform nucleus, right caudate body and the right insular and frontal operculum cortices.Marland Kitchen. Hospitalized 10/09/17 to 10/11/17    Mobility Status  walks independently      Balance Screen   Has the patient fallen in the past 6 months  No    Has the patient had a decrease in activity level because of a fear of falling?   No    Is the patient reluctant to leave their home because of a fear of falling?   No      Prior Functional Status   Cognitive/Linguistic Baseline  Within functional limits    Type of Home  House     Lives With  Spouse    Available Support  Family    Vocation  Full time employment on short term disability at this time      Cognition   Overall Cognitive Status  Within Functional Limits for tasks assessed    Attention  Divided    Divided Attention  Appears intact    Memory  Appears intact    Awareness  Appears intact    Problem Solving  Impaired    Problem Solving Impairment  Verbal complex      Auditory Comprehension   Overall  Auditory Comprehension  Appears within functional limits for tasks assessed      Reading Comprehension   Reading Status  Within funtional limits      Expression   Primary Mode of Expression  Verbal      Verbal Expression   Overall Verbal Expression  Appears within functional limits for tasks assessed      Written Expression   Dominant Hand  Right    Written Expression  Within Functional Limits      Oral Motor/Sensory Function   Overall Oral Motor/Sensory Function  Appears within functional limits for tasks assessed      Motor Speech   Overall Motor Speech  Appears within functional limits for tasks assessed                      SLP Education - 2017/11/22 1225    Education provided  Yes    Education Details  cognitive activities to do at home, fatigue can affect attention, processing - take breaks as needed; start back to work part time if able    [employer]) Educated  Patient;Spouse     Methods  Explanation;Verbal cues;Handout    Comprehension  Verbalized understanding           Plan - 22-Nov-2017 1227    Clinical Impression Statement  Mr. Tallman, a 61 y.o male suffered a right CVA 10/09/17. He is referred to outpt speech therapy for cognition and speech.  He is accompanied by his spouse, Jola Babinski. Mrs. Doxtater reports h/o of mild word finding episodes prior to CVA, she is unsure if this has increased. I did not observe any difficulty word finding duirng this evaluation. Memory appears inact, Mr. Awbrey recalled 5/5 words with delay. He followed detailed directions on check writing and balancing tasks, dividing attention between financial task and conversation successfully. Reasoning and safety awareness appear intact. Pt is managing his medications and finances at home. On simple problem solving (time and money) overall processing is slow. Pt IDd and corrected his errors with supervision cues. I do not recommend skilled ST at this time, however, cautioned pt re: fatigue due to recent CVA. Explained that fatigue can affect his attention/focus and processing speed. Encouraged him to work half days initially, when he is cleared medically to return to work, if this is an option for him.  When cleared for driving, encouraged pt to start in low traffic, good weather intially.    Speech Therapy Frequency  -- eval only    Potential to Achieve Goals  Good    Consulted and Agree with Plan of Care  Patient;Family member/caregiver       Patient will benefit from skilled therapeutic intervention in order to improve the following deficits and impairments:   Cognitive communication deficit  G-Codes - 22-Nov-2017 1233    Functional Assessment Tool Used  NOMS    Functional Limitations  Memory    Memory Current Status [phone])  0 percent impaired, limited or restricted    Memory Goal Status (U0454)  0 percent impaired, limited or restricted    Memory Discharge Status (G9170)  0 percent impaired,  limited or restricted       Problem List Patient Active Problem List   Diagnosis Date Noted  . Acute hypoxemic respiratory failure (HCC)   . Stroke (cerebrum) (HCC) 10/09/2017    Lovvorn, Radene Journey MS, CCC-SLP 11/22/2017, 12:35 PM  [facility] [address] [place], [place], [address] Phone: [phone]  Fax:  [phone]  Name: Raymond PoetDamon Shane Lowery MRN: [id number] Date of Birth: 10-05-1956

## 2017-11-01 ENCOUNTER — Telehealth: Payer: Self-pay | Admitting: *Deleted

## 2017-11-01 NOTE — Telephone Encounter (Signed)
Pt wife called need form asap. Please call [phone]

## 2017-11-02 ENCOUNTER — Encounter (INDEPENDENT_AMBULATORY_CARE_PROVIDER_SITE_OTHER): Payer: 59

## 2017-11-02 ENCOUNTER — Other Ambulatory Visit: Payer: Self-pay | Admitting: Internal Medicine

## 2017-11-02 ENCOUNTER — Other Ambulatory Visit: Payer: Self-pay | Admitting: Family Medicine

## 2017-11-02 DIAGNOSIS — I639 Cerebral infarction, unspecified: Secondary | ICD-10-CM

## 2017-11-02 DIAGNOSIS — I499 Cardiac arrhythmia, unspecified: Secondary | ICD-10-CM | POA: Diagnosis not present

## 2017-11-06 ENCOUNTER — Ambulatory Visit (HOSPITAL_COMMUNITY)
Admission: RE | Admit: 2017-11-06 | Discharge: 2017-11-06 | Disposition: A | Discharge status: Home / Self Care | Payer: 59 | Source: Ambulatory Visit | Attending: Interventional Radiology | Admitting: Interventional Radiology

## 2017-11-06 DIAGNOSIS — I639 Cerebral infarction, unspecified: Secondary | ICD-10-CM

## 2017-11-06 HISTORY — PX: IR RADIOLOGIST EVAL & MGMT: IMG5224

## 2017-11-06 NOTE — Telephone Encounter (Signed)
FMLA paperwork done and sign by Dr. Pearlean BrownieSethi. Pt has paid 50.00 fee. Sent to medical records.

## 2017-11-07 ENCOUNTER — Encounter (HOSPITAL_COMMUNITY): Payer: Self-pay | Admitting: Interventional Radiology

## 2017-11-07 ENCOUNTER — Telehealth: Payer: Self-pay | Admitting: *Deleted

## 2017-11-07 NOTE — Telephone Encounter (Signed)
Pt FMLA form ready @ front desk for p/u

## 2017-11-08 ENCOUNTER — Ambulatory Visit: Payer: 59 | Attending: Neurology | Admitting: Physical Therapy

## 2017-11-08 ENCOUNTER — Encounter: Payer: Self-pay | Admitting: Physical Therapy

## 2017-11-08 DIAGNOSIS — R2681 Unsteadiness on feet: Secondary | ICD-10-CM | POA: Diagnosis present

## 2017-11-08 DIAGNOSIS — I69354 Hemiplegia and hemiparesis following cerebral infarction affecting left non-dominant side: Secondary | ICD-10-CM | POA: Diagnosis not present

## 2017-11-08 NOTE — Patient Instructions (Addendum)
Perform these in the corner with a chair in front of you for safety: Feet Together (Compliant Surface) Head Motion - Eyes Closed    Stand on compliant surface: pillows/soft surface with feet together. Close eyes and move head slowly: 1. Up and down x 10 reps 2. Left and right x 10 reps 3. Up right/down left x 10 reps 4. Up left/down right x 10 reps  Do __1-2__ sessions per day.  Copyright  VHI. All rights reserved.   Single Leg (Compliant Surface) - Eyes Open    Stand on compliant surface: _pillows/soft surface_ holding support. Lift right leg while maintaining balance over other leg. Progress to removing hands from support surface for longer periods of time. Hold_25-30_ seconds. Repeat _3_ times each leg per session. Do __1-2__ sessions per day.  Copyright  VHI. All rights reserved.
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
## 2017-11-10 ENCOUNTER — Encounter: Payer: Self-pay | Admitting: Physical Therapy

## 2017-11-10 ENCOUNTER — Ambulatory Visit: Payer: 59 | Admitting: Physical Therapy

## 2017-11-10 DIAGNOSIS — I69354 Hemiplegia and hemiparesis following cerebral infarction affecting left non-dominant side: Secondary | ICD-10-CM

## 2017-11-10 DIAGNOSIS — R2681 Unsteadiness on feet: Secondary | ICD-10-CM
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
## 2017-11-14 ENCOUNTER — Encounter: Payer: Self-pay | Admitting: Neurology

## 2017-11-14 ENCOUNTER — Ambulatory Visit: Payer: 59 | Admitting: Neurology

## 2017-11-14 VITALS — BP 120/68 | HR 73 | Ht 71.0 in | Wt 170.0 lb

## 2017-11-14 DIAGNOSIS — I63511 Cerebral infarction due to unspecified occlusion or stenosis of right middle cerebral artery: Secondary | ICD-10-CM

## 2017-11-14 NOTE — Patient Instructions (Signed)
I had a long d/w patient and his wifeabout his recent stroke, risk for recurrent stroke/TIAs, personally independently reviewed imaging studies and stroke evaluation results and answered questions.Continue aspirin 325 mg daily and clopidogrel 75 mg daily  for secondary stroke prevention for a total of 6 months and then discontinue clopidogrel and stay on aspirin alone and maintain strict control of hypertension with blood pressure goal below 130/90, diabetes with hemoglobin A1c goal below 6.5% and lipids with LDL cholesterol goal below 70 mg/dL. I also advised the patient to eat a healthy diet with plenty of whole grains, cereals, fruits and vegetables, exercise regularly and maintain ideal body weight. I have counseled the patient to quit smoking completely and he agrees to do so. The patient can drive now since his upper quadrant visual field defect has improved. He may return back to work but to increase activity gradually as tolerated. Followup in the future with my nurse practitioner in 3 months or call earlier if necessary   Stroke Prevention Some medical conditions and behaviors are associated with a higher chance of having a stroke. You can help prevent a stroke by making nutrition, lifestyle, and other changes, including managing any medical conditions you may have. What nutrition changes can be made?  Eat healthy foods. You can do this by: ? Choosing foods high in fiber, such as fresh fruits and vegetables and whole grains. ? Eating at least 5 or more servings of fruits and vegetables a day. Try to fill half of your plate at each meal with fruits and vegetables. ? Choosing lean protein foods, such as lean cuts of meat, poultry without skin, fish, tofu, beans, and nuts. ? Eating low-fat dairy products. ? Avoiding foods that are high in salt (sodium). This can help lower blood pressure. ? Avoiding foods that have saturated fat, trans fat, and cholesterol. This can help prevent high  cholesterol. ? Avoiding processed and premade foods.  Follow your health care provider's specific guidelines for losing weight, controlling high blood pressure (hypertension), lowering high cholesterol, and managing diabetes. These may include: ? Reducing your daily calorie intake. ? Limiting your daily sodium intake to 1,500 milligrams (mg). ? Using only healthy fats for cooking, such as olive oil, canola oil, or sunflower oil. ? Counting your daily carbohydrate intake. What lifestyle changes can be made?  Maintain a healthy weight. Talk to your health care provider about your ideal weight.  Get at least 30 minutes of moderate physical activity at least 5 days a week. Moderate activity includes brisk walking, biking, and swimming.  Do not use any products that contain nicotine or tobacco, such as cigarettes and e-cigarettes. If you need help quitting, ask your health care provider. It may also be helpful to avoid exposure to secondhand smoke.  Limit alcohol intake to no more than 1 drink a day for nonpregnant women and 2 drinks a day for men. One drink equals 12 oz of beer, 5 oz of wine, or 1 oz of hard liquor.  Stop any illegal drug use.  Avoid taking birth control pills. Talk to your health care provider about the risks of taking birth control pills if: ? You are over 103 years old. ? You smoke. ? You get migraines. ? You have ever had a blood clot. What other changes can be made?  Manage your cholesterol levels. ? Eating a healthy diet is important for preventing high cholesterol. If cholesterol cannot be managed through diet alone, you may also need to take medicines. ?  Take any prescribed medicines to control your cholesterol as told by your health care provider.  Manage your diabetes. ? Eating a healthy diet and exercising regularly are important parts of managing your blood sugar. If your blood sugar cannot be managed through diet and exercise, you may need to take  medicines. ? Take any prescribed medicines to control your diabetes as told by your health care provider.  Control your hypertension. ? To reduce your risk of stroke, try to keep your blood pressure below 130/80. ? Eating a healthy diet and exercising regularly are an important part of controlling your blood pressure. If your blood pressure cannot be managed through diet and exercise, you may need to take medicines. ? Take any prescribed medicines to control hypertension as told by your health care provider. ? Ask your health care provider if you should monitor your blood pressure at home. ? Have your blood pressure checked every year, even if your blood pressure is normal. Blood pressure increases with age and some medical conditions.  Get evaluated for sleep disorders (sleep apnea). Talk to your health care provider about getting a sleep evaluation if you snore a lot or have excessive sleepiness.  Take over-the-counter and prescription medicines only as told by your health care provider. Aspirin or blood thinners (antiplatelets or anticoagulants) may be recommended to reduce your risk of forming blood clots that can lead to stroke.  Make sure that any other medical conditions you have, such as atrial fibrillation or atherosclerosis, are managed. What are the warning signs of a stroke? The warning signs of a stroke can be easily remembered as BEFAST.  B is for balance. Signs include: ? Dizziness. ? Loss of balance or coordination. ? Sudden trouble walking.  E is for eyes. Signs include: ? A sudden change in vision. ? Trouble seeing.  F is for face. Signs include: ? Sudden weakness or numbness of the face. ? The face or eyelid drooping to one side.  A is for arms. Signs include: ? Sudden weakness or numbness of the arm, usually on one side of the body.  S is for speech. Signs include: ? Trouble speaking (aphasia). ? Trouble understanding.  T is for time. ? These symptoms may  represent a serious problem that is an emergency. Do not wait to see if the symptoms will go away. Get medical help right away. Call your local emergency services (911 in the [place] Do not drive yourself to the hospital.  Other signs of stroke may include: ? A sudden, severe headache with no known cause. ? Nausea or vomiting. ? Seizure.  Where to find more information: For more information, visit:  American Stroke Association: www.strokeassociation.org  National Stroke Association: www.stroke.org  Summary  You can prevent a stroke by eating healthy, exercising, not smoking, limiting alcohol intake, and managing any medical conditions you may have.  Do not use any products that contain nicotine or tobacco, such as cigarettes and e-cigarettes. If you need help quitting, ask your health care provider. It may also be helpful to avoid exposure to secondhand smoke.  Remember BEFAST for warning signs of stroke. Get help right away if you or a loved one has any of these signs. This information is not intended to replace advice given to you by your health care provider. Make sure you discuss any questions you have with your health care provider. Document Released: 11/24/2004 Document Revised: 11/22/2016 Document Reviewed: 11/22/2016 Elsevier Interactive Patient Education  [employer]2018 Elsevier Inc.

## 2017-11-14 NOTE — Progress Notes (Signed)
[facility] [address] [place] [id number] [phone]       OFFICE FOLLOW-UP NOTE  Raymond Lowery Date of Birth:  03-13-1956 Medical Record Number:  [id number]   HPI: Raymond Lowery is a 62 year old Caucasian male seen today for first office follow-up visit for in-hospital admission for stroke in December 2018. He is accompanied by his wife. History is obtained from them as well as review of electronic medical records. I have personally reviewed imaging films.Raymond KohlDamon Shane [name] a 61 y.o.malewith a history of tobacco abuse who presents with left sided weakness. He had transient symptoms this morning and subsequently improved. He actually worked for most of the day, but then hais wife saw him reaching with his right arm because he could not use his left arm well.She therefore called 911. She states that his left side has seemed to get somewhat better in the interim, but he still has significant deficits. [id number].tpa given?: no,outside of window.Patient was felt to have clinical signs of large vessel occlusion and hence underwent Emergent large vessel occlusion of the right middle cerebral artery at its origin, in addition to long segment occlusion/critical stenosis of the right internal carotid artery beginning at the carotid bifurcation with a CT angiographic string sign extending to the skullbase, where the artery is completely occluded CT perfusion which showedlarge area of penumbra with right M1 occlusion,suspectedsecondary to carotid disease emergent cerebral catheter angiogram that showed right M1 occlusion and after written consent patient underwent emergent revascularization with the mechanical embolectomy with complete revascularization of the right middle cerebral artery with solitaire retrieval device and right ICA stent assisted angioplasty achieving.TICI 2b reperfusion . He was admitted to the intensive care unit where blood pressure was tightly  controlled. He was extubated and did well. MRI scan of the brain showed a right MCA infarct involving basal ganglia, right insular and frontal operculum cortex. Transthoracic echo showed normal ejection fraction. LDL cholesterol was 69 mg percent and hemoglobin A1c was 5.3. Patient was counseled to quit smoking and started on aspirin and Plavix given his recent carotid stent He was discharged home and states his done well. He has cut back smoking significantly but still smokes 4-6 cigarettes per day but plans to quit soon. His blood pressure is well controlled today is 120/68. Is tolerating aspirin and Plavix without significant bleeding or bruising. He is tolerating Lipitor well without muscle aches and pains. His began practically full strength on the left side except some diminished fine motor skills in the left hand. He wants to return to work and driving.     ROS:   14 system review of systems is positive for cough, wheezing, skin moles and all other systems negative  PMH:  Past Medical History:  Diagnosis Date  . Stroke [facility](HCC)     Social History:  Social History   Socioeconomic History  . Marital status: Married    Spouse name: Not on file  . Number of children: Not on file  . Years of education: Not on file  . Highest education level: Not on file  Social Needs  . Financial resource strain: Not on file  . Food insecurity - worry: Not on file  . Food insecurity - inability: Not on file  . Transportation needs - medical: Not on file  . Transportation needs - non-medical: Not on file  Occupational History  . Not on file  Tobacco Use  . Smoking status: Current Every Day Smoker    Packs/day: 0.50  Types: Cigarettes  . Smokeless tobacco: Former Engineer, water and Sexual Activity  . Alcohol use: No    Frequency: Never  . Drug use: No  . Sexual activity: Not on file  Other Topics Concern  . Not on file  Social History Narrative  . Not on file    Medications:   Current  Outpatient Medications on File Prior to Visit  Medication Sig Dispense Refill  . aspirin 325 MG tablet Take 1 tablet (325 mg total) by mouth daily with breakfast. 30 tablet 1  . atorvastatin (LIPITOR) 40 MG tablet Take 1 tablet (40 mg total) by mouth daily at 6 PM. 30 tablet 1  . clopidogrel (PLAVIX) 75 MG tablet Take 1 tablet (75 mg total) by mouth daily with breakfast. 30 tablet 1   No current facility-administered medications on file prior to visit.     Allergies:  No Known Allergies  Physical Exam General: well developed, well nourished middle-age Caucasian male, seated, in no evident distress Head: head normocephalic and atraumatic.  Neck: supple with no carotid or supraclavicular bruits Cardiovascular: regular rate and rhythm, no murmurs Musculoskeletal: no deformity Skin:  no rash/petichiae Vascular:  Normal pulses all extremities Vitals:   11/14/17 1332  BP: 120/68  Pulse: 73   Neurologic Exam Mental Status: Awake and fully alert. Oriented to place and time. Recent and remote memory intact. Attention span, concentration and fund of knowledge appropriate. Mood and affect appropriate.  Cranial Nerves: Fundoscopic exam reveals sharp disc margins. Pupils equal, briskly reactive to light. Extraocular movements full without nystagmus. Visual fields full to confrontation. Hearing intact. Facial sensation intact. Face, tongue, palate moves normally and symmetrically.  Motor: Normal bulk and tone. Normal strength in all tested extremity muscles. Diminished fine finger movements on the left. Mild left grip weakness. Orbits right over left upper extremity. Sensory.: intact to touch ,pinprick .position and vibratory sensation.  Coordination: Rapid alternating movements normal in all extremities. Finger-to-nose and heel-to-shin performed accurately bilaterally. Gait and Station: Arises from chair without difficulty. Stance is normal. Gait demonstrates normal stride length and balance . Able  to heel, toe and tandem walk without difficulty.  Reflexes: 1+ and symmetric. Toes downgoing.   NIHSS  0 Modified Rankin  2   ASSESSMENT:  62 year old Caucasian male with Right MCA infarct due to right MCA occlusion due to artert to artery embolism from proximal right ICA stenosis treated with mechanical embolectomy  with complete recanalization of right MCA and with rescue right ICA stenting and excellent clinical outcome. Multiple vascular risk factors of carotid stenosis, smoking, hypertension, hyperlipidemia   PLAN:  I had a long d/w patient  and his wife about his recent stroke, mechanical embolectomy risk for recurrent stroke/TIAs, personally independently reviewed imaging studies and stroke evaluation results and answered questions.Continue aspirin 325 mg and Plavix 75 mg daily  for secondary stroke prevention for total of 6 months f given his recent carotid stent ollowed by aspirin alone and maintain strict control of hypertension with blood pressure goal below 130/90, diabetes with hemoglobin A1c goal below 6.5% and lipids with LDL cholesterol goal below 70 mg/dL. I have strongly counseled the patient to quit smoking completely and is willing to do so. I also advised the patient to eat a healthy diet with plenty of whole grains, cereals, fruits and vegetables, exercise regularly and maintain ideal body weight .I have advised him to start driving since his vision loss has improved and to return to work without restrictions but to increase  his activity slowly as tolerated. Followup in the future with  my nurse practitioner Shanda Bumps in 3 months or call earlier if necessary Greater than 50% of time during this 35 minute visit was spent on counseling,explanation of diagnosis, planning of further management, discussion with patient and family and coordination of care about his stroke, carotid stenting and mechanical embolectomy discussion Delia Heady, MD  [facility] [address] [place], [place] [address]  Phone [phone] Fax [phone] Note: This document was prepared with digital dictation and possible smart phrase technology. Any transcriptional errors that result from this process are unintentional

## 2017-11-15 ENCOUNTER — Ambulatory Visit: Payer: 59 | Admitting: Physical Therapy

## 2017-11-17 ENCOUNTER — Ambulatory Visit: Payer: 59 | Admitting: Rehabilitation

## 2017-11-21 ENCOUNTER — Telehealth: Payer: Self-pay | Admitting: Neurology

## 2017-11-21 NOTE — Telephone Encounter (Signed)
Weight change to 170lbs. Rn call wife that pts hospital weight was 161 in December 2018. Rn stated the pt gain 10 lbs in a month. The wife stated when he saw his PCP it was 170lbs. Pt has been eating 3 meals a day. Weight change.

## 2017-11-21 NOTE — Telephone Encounter (Signed)
Pts wife calling stating that on the forms pts weighted is 160 but should be 170. Pts wife is wanting it fixed so the PCP wont be worried that his weight changed so quickly. She stated that need be pt can be reached at [phone]

## 2017-11-22 ENCOUNTER — Ambulatory Visit: Payer: 59 | Admitting: Physical Therapy

## 2017-11-22 ENCOUNTER — Telehealth (HOSPITAL_COMMUNITY): Payer: Self-pay | Admitting: Radiology

## 2017-11-22 NOTE — Telephone Encounter (Signed)
Pt's wife called and requested a referral to cardiology. Pt recently had 7-day loop recorder and was told to see cardiology. Referral was sent to Dr. Jacinto HalimGanji, MD. Edmonia CaprioJM

## 2017-11-24 ENCOUNTER — Ambulatory Visit: Payer: 59 | Admitting: Rehabilitation

## 2017-11-24 ENCOUNTER — Telehealth: Payer: Self-pay | Admitting: Neurology

## 2017-11-24 ENCOUNTER — Emergency Department (HOSPITAL_COMMUNITY): Payer: 59

## 2017-11-24 ENCOUNTER — Emergency Department (HOSPITAL_COMMUNITY)
Admission: EM | Admit: 2017-11-24 | Discharge: 2017-11-24 | Disposition: A | Discharge status: Home / Self Care | Payer: 59 | Attending: Emergency Medicine | Admitting: Emergency Medicine

## 2017-11-24 ENCOUNTER — Other Ambulatory Visit: Payer: Self-pay

## 2017-11-24 ENCOUNTER — Encounter (HOSPITAL_COMMUNITY): Payer: Self-pay | Admitting: Emergency Medicine

## 2017-11-24 ENCOUNTER — Other Ambulatory Visit: Payer: Self-pay | Admitting: Family Medicine

## 2017-11-24 DIAGNOSIS — Z79899 Other long term (current) drug therapy: Secondary | ICD-10-CM | POA: Insufficient documentation

## 2017-11-24 DIAGNOSIS — I951 Orthostatic hypotension: Secondary | ICD-10-CM | POA: Insufficient documentation

## 2017-11-24 DIAGNOSIS — Z7902 Long term (current) use of antithrombotics/antiplatelets: Secondary | ICD-10-CM | POA: Insufficient documentation

## 2017-11-24 DIAGNOSIS — Z7982 Long term (current) use of aspirin: Secondary | ICD-10-CM | POA: Diagnosis not present

## 2017-11-24 DIAGNOSIS — F17210 Nicotine dependence, cigarettes, uncomplicated: Secondary | ICD-10-CM | POA: Insufficient documentation

## 2017-11-24 DIAGNOSIS — R748 Abnormal levels of other serum enzymes: Secondary | ICD-10-CM

## 2017-11-24 DIAGNOSIS — Z8673 Personal history of transient ischemic attack (TIA), and cerebral infarction without residual deficits: Secondary | ICD-10-CM | POA: Diagnosis not present

## 2017-11-24 DIAGNOSIS — W19XXXA Unspecified fall, initial encounter: Secondary | ICD-10-CM

## 2017-11-24 LAB — I-STAT TROPONIN, ED: TROPONIN I, POC: 0 ng/mL (ref 0.00–0.08)

## 2017-11-24 LAB — CBC
HEMATOCRIT: 36 % — AB (ref 39.0–52.0)
HEMOGLOBIN: 12.1 g/dL — AB (ref 13.0–17.0)
MCH: 31.6 pg (ref 26.0–34.0)
MCHC: 33.6 g/dL (ref 30.0–36.0)
MCV: 94 fL (ref 78.0–100.0)
Platelets: 248 10*3/uL (ref 150–400)
RBC: 3.83 MIL/uL — ABNORMAL LOW (ref 4.22–5.81)
RDW: 13.9 % (ref 11.5–15.5)
WBC: 8.4 10*3/uL (ref 4.0–10.5)

## 2017-11-24 LAB — URINALYSIS, ROUTINE W REFLEX MICROSCOPIC
Bilirubin Urine: NEGATIVE
Glucose, UA: NEGATIVE mg/dL
Hgb urine dipstick: NEGATIVE
KETONES UR: NEGATIVE mg/dL
LEUKOCYTES UA: NEGATIVE
NITRITE: NEGATIVE
PH: 6 (ref 5.0–8.0)
Protein, ur: NEGATIVE mg/dL
SPECIFIC GRAVITY, URINE: 1.008 (ref 1.005–1.030)

## 2017-11-24 LAB — BASIC METABOLIC PANEL
ANION GAP: 10 (ref 5–15)
BUN: 9 mg/dL (ref 6–20)
CO2: 23 mmol/L (ref 22–32)
Calcium: 8.9 mg/dL (ref 8.9–10.3)
Chloride: 106 mmol/L (ref 101–111)
Creatinine, Ser: 0.77 mg/dL (ref 0.61–1.24)
GFR calc Af Amer: >60 mL/min (ref >60)
Glucose, Bld: 105 mg/dL — ABNORMAL HIGH (ref 65–99)
POTASSIUM: 3.7 mmol/L (ref 3.5–5.1)
SODIUM: 139 mmol/L (ref 135–145)

## 2017-11-24 MED ORDER — SODIUM CHLORIDE 0.9 % IV BOLUS (SEPSIS)
500.0000 mL | Freq: Once | INTRAVENOUS | Status: AC
  Administered 2017-11-24: 500 mL via INTRAVENOUS

## 2017-11-24 NOTE — Telephone Encounter (Signed)
Pt's wife called said he fell twice last night, pt said he got a little dizzy. The episodes did not last long. She noticed the left pupil was larger than the right, broke out in a "major" sweat, no stuttering, he was coherent. She said [facility]. She said ED advised at discharge his BP drops when he stands. I advised her per discharge notes to make an appt with PCP, she agreed. She is wanting Dr Pearlean BrownieSethi to be aware. FYI

## 2017-11-24 NOTE — ED Triage Notes (Signed)
Per EMS, pt from home, witnessed fall getting up to the bathroom. Wife reports pt was diaphoretic. Upon EMS arrival, pt symptoms resolved. Pt ambulatory from EMS stretcher to ED stretcher. A&O x 4. EMS vitals: BP 117/64, CBG 90.

## 2017-11-24 NOTE — ED Notes (Signed)
Patient transported to CT 

## 2017-11-24 NOTE — ED Provider Notes (Addendum)
[facility] EMERGENCY DEPARTMENT Provider Note   CSN: [id number] Arrival date & time: 11/24/17  0219     History   Chief Complaint Chief Complaint  Patient presents with  . Fall    HPI Raymond Lowery is a 62 y.o. male.  The history is provided by the patient.  Fall  This is a new problem. The current episode started less than 1 hour ago. The problem occurs constantly. The problem has not changed since onset.Pertinent negatives include no chest pain, no abdominal pain, no headaches and no shortness of breath. Nothing aggravates the symptoms. Nothing relieves the symptoms. He has tried nothing for the symptoms. The treatment provided no relief.  Went to the bathroom and fell over the toilet.  No DOE, no CP, no SOB.  No n/v/d.  No weakness or numbness.    Past Medical History:  Diagnosis Date  . Stroke [facility])     Patient Active Problem List   Diagnosis Date Noted  . Acute hypoxemic respiratory failure (HCC)   . Stroke (cerebrum) (HCC) 10/09/2017    Past Surgical History:  Procedure Laterality Date  . IR ANGIO EXTRACRAN SEL COM CAROTID INNOMINATE UNI L MOD SED  10/09/2017  . IR ANGIO VERTEBRAL SEL SUBCLAVIAN INNOMINATE UNI R MOD SED  10/09/2017  . IR INTRAVSC STENT CERV CAROTID W/O EMB-PROT MOD SED INC ANGIO  10/09/2017  . IR PERCUTANEOUS ART THROMBECTOMY/INFUSION INTRACRANIAL INC DIAG ANGIO  10/09/2017  . IR RADIOLOGIST EVAL & MGMT  11/06/2017  . RADIOLOGY WITH ANESTHESIA N/A 10/09/2017   Procedure: RADIOLOGY WITH ANESTHESIA;  Surgeon: Radiologist, Medication, MD;  Location: [facility] OR;  Service: Radiology;  Laterality: N/A;       Home Medications    Prior to Admission medications   Medication Sig Start Date End Date Taking? Authorizing Provider  aspirin 325 MG tablet Take 1 tablet (325 mg total) by mouth daily with breakfast. 10/12/17  Yes Costello, Lamar Blinks, NP  atorvastatin (LIPITOR) 40 MG tablet Take 1 tablet (40 mg total) by mouth daily at 6 PM.  10/11/17  Yes Costello, Lamar Blinks, NP  clopidogrel (PLAVIX) 75 MG tablet Take 1 tablet (75 mg total) by mouth daily with breakfast. 10/12/17  Yes Costello, Lamar Blinks, NP    Family History No family history on file.  Social History Social History   Tobacco Use  . Smoking status: Current Every Day Smoker    Packs/day: 0.50    Types: Cigarettes  . Smokeless tobacco: Former Engineer, water Use Topics  . Alcohol use: No    Frequency: Never  . Drug use: No     Allergies   Patient has no known allergies.   Review of Systems Review of Systems  Constitutional: Negative for diaphoresis and fever.  HENT: Negative for congestion.   Eyes: Negative for photophobia.  Respiratory: Negative for cough and shortness of breath.   Cardiovascular: Negative for chest pain, palpitations and leg swelling.  Gastrointestinal: Negative for abdominal pain, nausea and vomiting.  Genitourinary: Negative for dysuria and flank pain.  Neurological: Negative for tremors, seizures, syncope, facial asymmetry, speech difficulty, weakness, numbness and headaches.  All other systems reviewed and are negative.    Physical Exam Updated Vital Signs BP 112/62   Pulse 71   Temp 98.6 F (37 C) (Oral)   Resp (!) 21   Ht 5\' 11"  (1.803 m)   Wt 77.1 kg (170 lb)   SpO2 93%   BMI 23.71 kg/m   Physical  Exam  Constitutional: He is oriented to person, place, and time. He appears well-developed and well-nourished. No distress.  HENT:  Head: Normocephalic and atraumatic.  Nose: Nose normal.  Mouth/Throat: Oropharynx is clear and moist. No oropharyngeal exudate.  Eyes: Conjunctivae and EOM are normal. Pupils are equal, round, and reactive to light.  Neck: Normal range of motion. Neck supple.  Cardiovascular: Normal rate, regular rhythm, normal heart sounds and intact distal pulses.  Pulmonary/Chest: Effort normal and breath sounds normal. No stridor. He has no wheezes. He has no rales.  Abdominal: Soft. Bowel sounds  are normal. He exhibits no mass. There is no tenderness. There is no rebound and no guarding.  Musculoskeletal: Normal range of motion.  Neurological: He is alert and oriented to person, place, and time. He displays normal reflexes. No cranial nerve deficit. Coordination normal.  Skin: Skin is warm and dry. Capillary refill takes less than 2 seconds.     ED Treatments / Results  Labs (all labs ordered are listed, but only abnormal results are displayed)  Results for orders placed or performed during the hospital encounter of 11/24/17  Basic metabolic panel  Result Value Ref Range   Sodium 139 135 - 145 mmol/L   Potassium 3.7 3.5 - 5.1 mmol/L   Chloride 106 101 - 111 mmol/L   CO2 23 22 - 32 mmol/L   Glucose, Bld 105 (H) 65 - 99 mg/dL   BUN 9 6 - 20 mg/dL   Creatinine, Ser 4.09 0.61 - 1.24 mg/dL   Calcium 8.9 8.9 - 81.1 mg/dL   GFR calc non Af Amer >60 >60 mL/min   GFR calc Af Amer >60 >60 mL/min   Anion gap 10 5 - 15  CBC  Result Value Ref Range   WBC 8.4 4.0 - 10.5 K/uL   RBC 3.83 (L) 4.22 - 5.81 MIL/uL   Hemoglobin 12.1 (L) 13.0 - 17.0 g/dL   HCT 91.4 (L) 78.2 - 95.6 %   MCV 94.0 78.0 - 100.0 fL   MCH 31.6 26.0 - 34.0 pg   MCHC 33.6 30.0 - 36.0 g/dL   RDW 21.3 08.6 - 57.8 %   Platelets 248 150 - 400 K/uL  Urinalysis, Routine w reflex microscopic  Result Value Ref Range   Color, Urine YELLOW YELLOW   APPearance CLEAR CLEAR   Specific Gravity, Urine 1.008 1.005 - 1.030   pH 6.0 5.0 - 8.0   Glucose, UA NEGATIVE NEGATIVE mg/dL   Hgb urine dipstick NEGATIVE NEGATIVE   Bilirubin Urine NEGATIVE NEGATIVE   Ketones, ur NEGATIVE NEGATIVE mg/dL   Protein, ur NEGATIVE NEGATIVE mg/dL   Nitrite NEGATIVE NEGATIVE   Leukocytes, UA NEGATIVE NEGATIVE  I-Stat Troponin, ED (not at [facility])  Result Value Ref Range   Troponin i, poc 0.00 0.00 - 0.08 ng/mL   Comment 3           Dg Chest 2 View  Result Date: 11/24/2017 CLINICAL DATA:  Witnessed fall.  Diaphoresis.  History of stroke.  EXAM: CHEST  2 VIEW COMPARISON:  10/13/2017 FINDINGS: Normal heart size and pulmonary vascularity. Emphysematous changes and scattered fibrosis in the lungs. Linear opacities in the lingula are similar to previous study, likely representing scarring. Calcified pleural plaques over the hemidiaphragms. No blunting of costophrenic angles. No pneumothorax. Mediastinal contours appear intact. IMPRESSION: Emphysematous changes in the lungs. No consolidation or edema. Scarring in the left lingula. Calcified pleural plaques. Electronically Signed   By: Burman Nieves M.D.   On: 11/24/2017  03:50   Ct Head Wo Contrast  Result Date: 11/24/2017 CLINICAL DATA:  Witnessed fall. Diaphoresis. Recent stroke in December. EXAM: CT HEAD WITHOUT CONTRAST TECHNIQUE: Contiguous axial images were obtained from the base of the skull through the vertex without intravenous contrast. COMPARISON:  MRI brain 10/10/2017.  CT head 10/09/2017. FINDINGS: Brain: Focal encephalomalacia in the right insular region consistent with old infarct corresponding to the acute infarct on the previous study. No mass-effect or midline shift. No ventricular dilatation. Gray-white matter junctions are distinct. Basal cisterns are not effaced. No evidence of acute intracranial hemorrhage. Vascular: Intracranial arterial vascular calcifications are present. Skull: Calvarium appears intact.  No depressed skull fractures. Sinuses/Orbits: Postoperative changes in the right maxillary sinuses and right nasopharynx. Partial opacification of the right maxillary sinus. No acute air-fluid levels. Mastoid air cells are not opacified. Other: None. IMPRESSION: No acute intracranial abnormalities. Old infarct in the right insular region. Electronically Signed   By: Burman NievesWilliam  Stevens M.D.   On: 11/24/2017 03:54   Ir Radiologist Eval & Mgmt  Result Date: 11/07/2017 EXAM: ESTABLISHED PATIENT OFFICE VISIT CHIEF COMPLAINT: No significant complaints. Current Pain Level: 1-10  HISTORY OF PRESENT ILLNESS: The patient is a 62 year old right-handed gentleman who is status post endovascular complete revascularization of occluded right internal carotid artery, and also of right internal carotid artery terminus, the right middle cerebral artery and the right anterior cerebral artery with mechanical thrombectomy, and stent assisted angioplasty of the right internal carotid artery 10/09/2017. The patient made a reasonable recovery to where the patient was able to be discharged home under the care of his wife. The patient required minimal rehab in terms of speech therapy and occupational therapy. The patient returns today accompanied by his wife. They both report the patient has almost returned to his baseline. He has minimal if any problems in terms of his memory. Denies any difficulty with speech, dysarthria, swallowing or chewing difficulties, motor, sensory or coordination difficulties. The patient denies any symptoms of loss of consciousness or seizure-like activity. According to the patient and also the spouse the patient needs no help with daily chores. The patient would like to return to his normal activities and work if possible. The patient also reports improvement in left visual field since the procedure. Denies any recent chills, fever or rigors. Denies any chest pain, shortness of breath or palpitations. Denies any coughing, wheezing, or hemoptysis. No abdominal pains, constipation, diarrhea or bloody stools. Past Medical History: History of stroke as mentioned above. Hypertension and history of hyperlipidemia. Medications: Aspirin 325 mg a day. Lipitor. Plavix 75 mg a day. Viagra, the patient has not used it has since the stroke. Allergies: No history of known allergies. Social History: Lives with his wife. Has two children alive and well. Patient denies drinking alcohol or illicit chemicals. The patient smokes 6 cigarettes a day down from two and a half packs a day prior to his  stroke. Family History: History of asthma. Mother died age 61 from staph infection. Father alive at age 61. REVIEW OF SYSTEMS: Essentially negative unless as mentioned above. PHYSICAL EXAMINATION: Appears in no acute distress.  Affect appropriate to the situation. Patient's responses appropriate prompt. No hesitation or difficulty finding words. The patient has no difficulty with his vision. No gross cranial nerve abnormalities, motor, sensory or coordination or station and gait abnormalities identified. ASSESSMENT AND PLAN: The patient's pre and post treatment angiograms were reviewed as was the MRI of the brain and MRA of the brain. Brought to their  attention was the occluded right internal carotid artery at the bulb, and at the terminus with occluded right middle and right anterior cerebral artery with subsequent revascularization of the right internal carotid artery with stent assisted angioplasty, and mechanical thrombectomy for the right internal carotid artery terminus and right MCA revascularization. The patient was again strongly advised to stop smoking. He was asked to continue with his daily activities. If okay with neurology, the patient could return to his work if he so wishes. The patient's wife was also informed of the second hand smoke with impact on her husband and also on her. Questions were answered to their satisfaction. The patient is scheduled to see his neurologist regarding a loop recorder and his cardiac status in terms of rhythm in the next few days. In the meantime, he was advised to continue taking his aspirin 325 mg a day, and Plavix 75 mg a day. A follow-up ultrasound of the carotids will be undertaken in approximately 4 to 6 months. The patient will be seen following the ultrasound of the carotids. Should the patient develop symptoms such as speech difficulties, visual problems, limb weakness, incoordination to call 911. They both leave with good understanding and agreement with the  above management plan. Electronically Signed   By: Julieanne Cotton M.D.   On: 11/06/2017 18:11    EKG  EKG Interpretation  Date/Time:  Friday November 24 2017 02:22:18 EST Ventricular Rate:  67 PR Interval:    QRS Duration: 99 QT Interval:  388 QTC Calculation: 410 R Axis:   74 Text Interpretation:  Sinus rhythm Confirmed by Calen Posch ([id number]) on 11/24/2017 6:35:24 AM       Radiology Dg Chest 2 View  Result Date: 11/24/2017 CLINICAL DATA:  Witnessed fall.  Diaphoresis.  History of stroke. EXAM: CHEST  2 VIEW COMPARISON:  10/13/2017 FINDINGS: Normal heart size and pulmonary vascularity. Emphysematous changes and scattered fibrosis in the lungs. Linear opacities in the lingula are similar to previous study, likely representing scarring. Calcified pleural plaques over the hemidiaphragms. No blunting of costophrenic angles. No pneumothorax. Mediastinal contours appear intact. IMPRESSION: Emphysematous changes in the lungs. No consolidation or edema. Scarring in the left lingula. Calcified pleural plaques. Electronically Signed   By: Burman Nieves M.D.   On: 11/24/2017 03:50   Ct Head Wo Contrast  Result Date: 11/24/2017 CLINICAL DATA:  Witnessed fall. Diaphoresis. Recent stroke in December. EXAM: CT HEAD WITHOUT CONTRAST TECHNIQUE: Contiguous axial images were obtained from the base of the skull through the vertex without intravenous contrast. COMPARISON:  MRI brain 10/10/2017.  CT head 10/09/2017. FINDINGS: Brain: Focal encephalomalacia in the right insular region consistent with old infarct corresponding to the acute infarct on the previous study. No mass-effect or midline shift. No ventricular dilatation. Gray-white matter junctions are distinct. Basal cisterns are not effaced. No evidence of acute intracranial hemorrhage. Vascular: Intracranial arterial vascular calcifications are present. Skull: Calvarium appears intact.  No depressed skull fractures. Sinuses/Orbits: Postoperative  changes in the right maxillary sinuses and right nasopharynx. Partial opacification of the right maxillary sinus. No acute air-fluid levels. Mastoid air cells are not opacified. Other: None. IMPRESSION: No acute intracranial abnormalities. Old infarct in the right insular region. Electronically Signed   By: Burman Nieves M.D.   On: 11/24/2017 03:54    Procedures Procedures (including critical care time)   Final Clinical Impressions(s) / ED Diagnoses   Fell over toilet.  No acute findings on exam labs or imaging.  I suspect this was micturition  near syncope as the patient did not pass out.  Stable for discharge at this time.  Follow up with your PMD.  Return for weakness, numbness, changes in vision or speech,  fevers > 100.4 unrelieved by medication, shortness of breath, intractable vomiting, or diarrhea, abdominal pain, Inability to tolerate liquids or food, cough, altered mental status or any concerns. No signs of systemic illness or infection. The patient is nontoxic-appearing on exam and vital signs are within normal limits.    I have reviewed the triage vital signs and the nursing notes. Pertinent labs &imaging results that were available during my care of the patient were reviewed by me and considered in my medical decision making (see chart for details).  After history, exam, and medical workup I feel the patient has been appropriately medically screened and is safe for discharge home. Pertinent diagnoses were discussed with the patient. Patient was given return precautions.      Madge Therrien, MD 11/24/17 1610    Cy Blamer, MD 11/24/17 [id number]    Cy Blamer, MD 11/24/17 [id number]

## 2017-11-25 NOTE — Telephone Encounter (Signed)
Thanks. Agree with plan 

## 2017-11-29 ENCOUNTER — Ambulatory Visit: Payer: 59 | Admitting: Physical Therapy

## 2017-12-01 ENCOUNTER — Ambulatory Visit: Payer: 59 | Admitting: Physical Therapy

## 2017-12-04 ENCOUNTER — Ambulatory Visit (HOSPITAL_COMMUNITY)
Admission: RE | Admit: 2017-12-04 | Discharge: 2017-12-04 | Disposition: A | Discharge status: Home / Self Care | Payer: 59 | Source: Ambulatory Visit | Attending: Family Medicine | Admitting: Family Medicine

## 2017-12-04 DIAGNOSIS — K8020 Calculus of gallbladder without cholecystitis without obstruction: Secondary | ICD-10-CM | POA: Insufficient documentation

## 2017-12-04 DIAGNOSIS — R748 Abnormal levels of other serum enzymes: Secondary | ICD-10-CM | POA: Insufficient documentation

## 2017-12-06 ENCOUNTER — Ambulatory Visit: Payer: 59 | Admitting: Physical Therapy

## 2017-12-06 LAB — PROTIME-INR

## 2017-12-08 ENCOUNTER — Ambulatory Visit: Payer: 59 | Admitting: Physical Therapy

## 2017-12-11 ENCOUNTER — Other Ambulatory Visit: Payer: Self-pay

## 2017-12-11 ENCOUNTER — Telehealth: Payer: Self-pay | Admitting: Neurology

## 2017-12-11 MED ORDER — CLOPIDOGREL BISULFATE 75 MG PO TABS: 75.0000 mg | ORAL_TABLET | Freq: Every day | ORAL | 1 refills | Status: DC

## 2017-12-11 MED ORDER — ATORVASTATIN CALCIUM 40 MG PO TABS: 40.0000 mg | ORAL_TABLET | Freq: Every day | ORAL | 1 refills | Status: DC

## 2017-12-11 NOTE — Telephone Encounter (Signed)
Refills done until visit by Shanda BumpsJessica NP in April 2019.

## 2017-12-11 NOTE — Telephone Encounter (Signed)
Patient's wife requesting refill of clopidogrel (PLAVIX) 75 MG tablet and atorvastatin (LIPITOR) 40 MG tablet [facility] [place].

## 2017-12-12 ENCOUNTER — Ambulatory Visit: Payer: Self-pay | Admitting: Neurology

## 2017-12-13 ENCOUNTER — Ambulatory Visit: Payer: 59 | Admitting: Physical Therapy

## 2017-12-15 ENCOUNTER — Ambulatory Visit: Payer: 59 | Admitting: Physical Therapy
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
## 2018-01-10 ENCOUNTER — Encounter: Payer: Self-pay | Admitting: Cardiovascular Disease

## 2018-01-15 ENCOUNTER — Other Ambulatory Visit: Payer: Self-pay

## 2018-01-15 NOTE — Patient Outreach (Signed)
Telephone outreach to patient to obtain mRS was successfully completed. mRS = 1 

## 2018-01-22 ENCOUNTER — Encounter: Payer: Self-pay | Admitting: Internal Medicine

## 2018-01-22 ENCOUNTER — Ambulatory Visit: Payer: 59 | Admitting: Internal Medicine

## 2018-01-22 DIAGNOSIS — B182 Chronic viral hepatitis C: Secondary | ICD-10-CM | POA: Diagnosis not present

## 2018-01-22 NOTE — Progress Notes (Signed)
[facility]   CC: consideration for treatment for chronic hepatitis C  HPI:  +Raymond Lowery is a 62 y.o. male who presents for initial evaluation and management of chronic hepatitis C.  Patient tested positive earlier this year by his PCP during follow up with elevated transaminases. Hepatitis C-associated risk factors present are: history of blood transfusion (details: when he was 62 years old). Patient denies IV drug abuse, multiple sexual partners, sexual contact with person with liver disease, tattoos. Patient has had other studies performed. Results: hepatitis C RNA by PCR, result: positive. Patient has not had prior treatment for Hepatitis C. Patient does not have a past history of liver disease. Patient does not have a family history of liver disease. Patient does not  have associated signs or symptoms related to liver disease.  Labs reviewed and confirm chronic hepatitis C with a positive viral load.   Records reviewed from Epic, recent stroke and followed by Dr. Pearlean BrownieSethi and Dr. Jacinto HalimGanji.       Patient does not have documented immunity to Hepatitis A. Patient does not have documented immunity to Hepatitis B.    Review of Systems:   Constitutional: negative for fatigue Gastrointestinal: negative for diarrhea Integument/breast: negative for rash All other systems reviewed and are negative       Past Medical History:  Diagnosis Date  . Stroke [facility](HCC)     Prior to Admission medications   Medication Sig Start Date End Date Taking? Authorizing Provider  aspirin 325 MG tablet Take 1 tablet (325 mg total) by mouth daily with breakfast. 10/12/17  Yes Costello, Lamar BlinksMary A, NP  atorvastatin (LIPITOR) 40 MG tablet Take 1 tablet (40 mg total) by mouth daily at 6 PM. 12/11/17  Yes Micki RileySethi, Pramod S, MD  clopidogrel (PLAVIX) 75 MG tablet Take 1 tablet (75 mg total) by mouth daily with breakfast. 12/11/17  Yes Micki RileySethi, Pramod S, MD  nicotine (NICODERM CQ - DOSED IN MG/24  HOURS) 21 mg/24hr patch Place 21 mg onto the skin daily.    [provider]    No Known Allergies  Social History   Tobacco Use  . Smoking status: Current Every Day Smoker    Packs/day: 1.00    Types: Cigarettes  . Smokeless tobacco: Former Engineer, waterUser  Substance Use Topics  . Alcohol use: No    Frequency: Never  . Drug use: No    FMH: no known hepatitis   Objective:  Constitutional: Awake, alert, nad,  Vitals:   01/22/18 1533  BP: (!) 144/67  Pulse: 73  Temp: 97.9 F (36.6 C)   Eyes: anicteric Cardiovascular: RRR Respiratory: CTA B; normal respiratory effort Gastrointestinal: soft, nt, nd Musculoskeletal: no edema Skin: No rashes; no porphyria cutanea tarda Lymphatic: no cervical lymphadenopathy   Laboratory Genotype: No results found for: HCVGENOTYPE HCV viral load: No results found for: HCVQUANT Lab Results  Component Value Date   WBC 8.4 11/24/2017   HGB 12.1 (L) 11/24/2017   HCT 36.0 (L) 11/24/2017   MCV 94.0 11/24/2017   PLT 248 11/24/2017    Lab Results  Component Value Date   CREATININE 0.77 11/24/2017   BUN 9 11/24/2017   NA 139 11/24/2017   K 3.7 11/24/2017   CL 106 11/24/2017   CO2 23 11/24/2017    Lab Results  Component Value Date   ALT 29 10/09/2017   AST 25 10/09/2017   ALKPHOS 62 10/09/2017     Labs and history reviewed and show  CHILD-PUGH A  5-6 points: Child class A 7-9 points: Child class B 10-15 points: Child class C  Lab Results  Component Value Date   INR 0.95 10/09/2017   BILITOT 0.8 10/09/2017   ALBUMIN 3.9 10/09/2017     Assessment: New Patient with Chronic Hepatitis C genotype 1a on lab from [place], untreated.  I discussed with the patient the lab findings that confirm chronic hepatitis C as well as the natural history and progression of disease including about 30% of people who develop cirrhosis of the liver if left untreated and once cirrhosis is established there is a 2-7% risk per year of liver cancer and  liver failure.  I discussed the importance of treatment and benefits in reducing the risk, even if significant liver fibrosis exists.   Also transaminitis which was normal prior to statin use.  AST 40 and ALT 59.    Plan: 1) Patient counseled extensively on limiting acetaminophen to no more than 2 grams daily, avoidance of alcohol. 2) Transmission discussed with patient including sexual transmission, sharing razors and toothbrush.   3) Will prescribe Epclusa for 12 weeks 4) Hepatitis A and B titers 5) Pneumovax vaccine if not previously given 6) Further work up to include liver staging with elastography 7) will follow up after labs and elastography to go over results and decide on treatment.  8) will check CMP today as well to be sure it is not increasing on statin.  9) encouraged smoking cessation

## 2018-01-23 LAB — CBC WITH DIFFERENTIAL/PLATELET
BASOS PCT: 0.9 %
Basophils Absolute: 73 cells/uL (ref 0–200)
EOS PCT: 1.8 %
Eosinophils Absolute: 146 cells/uL (ref 15–500)
HCT: 37.1 % — ABNORMAL LOW (ref 38.5–50.0)
Hemoglobin: 12.4 g/dL — ABNORMAL LOW (ref 13.2–17.1)
Lymphs Abs: 1936 cells/uL (ref 850–3900)
MCH: 29.9 pg (ref 27.0–33.0)
MCHC: 33.4 g/dL (ref 32.0–36.0)
MCV: 89.4 fL (ref 80.0–100.0)
MONOS PCT: 7.5 %
MPV: 10 fL (ref 7.5–12.5)
Neutro Abs: 5338 cells/uL (ref 1500–7800)
Neutrophils Relative %: 65.9 %
PLATELETS: 237 10*3/uL (ref 140–400)
RBC: 4.15 10*6/uL — AB (ref 4.20–5.80)
RDW: 12.2 % (ref 11.0–15.0)
TOTAL LYMPHOCYTE: 23.9 %
WBC mixed population: 608 cells/uL (ref 200–950)
WBC: 8.1 10*3/uL (ref 3.8–10.8)

## 2018-01-23 LAB — PROTIME-INR
INR: 1
PROTHROMBIN TIME: 10.4 s (ref 9.0–11.5)

## 2018-01-23 LAB — COMPLETE METABOLIC PANEL WITH GFR
AG RATIO: 1.5 (calc) (ref 1.0–2.5)
ALKALINE PHOSPHATASE (APISO): 92 U/L (ref 40–115)
ALT: 39 U/L (ref 9–46)
AST: 32 U/L (ref 10–35)
Albumin: 4.2 g/dL (ref 3.6–5.1)
BUN: 13 mg/dL (ref 7–25)
CO2: 29 mmol/L (ref 20–32)
Calcium: 9.4 mg/dL (ref 8.6–10.3)
Chloride: 104 mmol/L (ref 98–110)
Creat: 0.75 mg/dL (ref 0.70–1.25)
GFR, Est African American: 116 mL/min/{1.73_m2} (ref >=60)
GFR, Est Non African American: 100 mL/min/{1.73_m2} (ref >=60)
GLOBULIN: 2.8 g/dL (ref 1.9–3.7)
Glucose, Bld: 99 mg/dL (ref 65–99)
POTASSIUM: 4.1 mmol/L (ref 3.5–5.3)
SODIUM: 140 mmol/L (ref 135–146)
Total Bilirubin: 0.5 mg/dL (ref 0.2–1.2)
Total Protein: 7 g/dL (ref 6.1–8.1)

## 2018-01-23 LAB — HEPATITIS A ANTIBODY, TOTAL: Hepatitis A AB,Total: NONREACTIVE

## 2018-01-23 LAB — HEPATITIS B SURFACE ANTIBODY,QUALITATIVE: Hep B S Ab: NONREACTIVE

## 2018-01-23 LAB — HEPATITIS B CORE ANTIBODY, TOTAL: Hep B Core Total Ab: NONREACTIVE
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
## 2018-01-26 ENCOUNTER — Encounter (HOSPITAL_COMMUNITY): Payer: Self-pay

## 2018-01-26 ENCOUNTER — Ambulatory Visit (HOSPITAL_COMMUNITY)
Admission: RE | Admit: 2018-01-26 | Discharge: 2018-01-26 | Disposition: A | Discharge status: Home / Self Care | Payer: 59 | Source: Ambulatory Visit | Attending: Cardiology | Admitting: Cardiology

## 2018-01-26 ENCOUNTER — Other Ambulatory Visit: Payer: Self-pay

## 2018-01-26 ENCOUNTER — Encounter (HOSPITAL_COMMUNITY)
Admission: RE | Disposition: A | Discharge status: Home / Self Care | Payer: Self-pay | Source: Ambulatory Visit | Attending: Cardiology

## 2018-01-26 DIAGNOSIS — Z823 Family history of stroke: Secondary | ICD-10-CM | POA: Insufficient documentation

## 2018-01-26 DIAGNOSIS — Z96659 Presence of unspecified artificial knee joint: Secondary | ICD-10-CM | POA: Insufficient documentation

## 2018-01-26 DIAGNOSIS — Z79899 Other long term (current) drug therapy: Secondary | ICD-10-CM | POA: Insufficient documentation

## 2018-01-26 DIAGNOSIS — I639 Cerebral infarction, unspecified: Secondary | ICD-10-CM | POA: Diagnosis not present

## 2018-01-26 DIAGNOSIS — Z8249 Family history of ischemic heart disease and other diseases of the circulatory system: Secondary | ICD-10-CM | POA: Insufficient documentation

## 2018-01-26 DIAGNOSIS — I7090 Unspecified atherosclerosis: Secondary | ICD-10-CM

## 2018-01-26 DIAGNOSIS — E785 Hyperlipidemia, unspecified: Secondary | ICD-10-CM | POA: Diagnosis not present

## 2018-01-26 DIAGNOSIS — F17210 Nicotine dependence, cigarettes, uncomplicated: Secondary | ICD-10-CM | POA: Diagnosis not present

## 2018-01-26 DIAGNOSIS — Z7982 Long term (current) use of aspirin: Secondary | ICD-10-CM | POA: Insufficient documentation

## 2018-01-26 DIAGNOSIS — Q211 Atrial septal defect: Secondary | ICD-10-CM | POA: Insufficient documentation

## 2018-01-26 DIAGNOSIS — I700 Atherosclerosis of aorta: Secondary | ICD-10-CM | POA: Insufficient documentation

## 2018-01-26 HISTORY — DX: Unspecified atherosclerosis: I70.90

## 2018-01-26 HISTORY — PX: TEE WITHOUT CARDIOVERSION: SHX5443

## 2018-01-26 LAB — LIVER FIBROSIS, FIBROTEST-ACTITEST
ALPHA-2-MACROGLOBULIN: 256 mg/dL (ref 106–279)
ALT: 41 U/L (ref 9–46)
Apolipoprotein A1: 103 mg/dL (ref 94–176)
Bilirubin: 0.4 mg/dL (ref 0.2–1.2)
Fibrosis Score: 0.55
GGT: 76 U/L — ABNORMAL HIGH (ref 3–70)
Haptoglobin: 149 mg/dL (ref 43–212)
NECROINFLAMMAT ACT SCORE: 0.3
Reference ID: 2396927

## 2018-01-26 SURGERY — ECHOCARDIOGRAM, TRANSESOPHAGEAL
Anesthesia: Moderate Sedation

## 2018-01-26 MED ORDER — MIDAZOLAM HCL 5 MG/ML IJ SOLN
INTRAMUSCULAR | Status: AC
  Filled 2018-01-26: qty 2

## 2018-01-26 MED ORDER — MIDAZOLAM HCL 5 MG/5ML IJ SOLN
INTRAMUSCULAR | Status: DC | PRN
  Administered 2018-01-26: 3 mg via INTRAVENOUS

## 2018-01-26 MED ORDER — BUTAMBEN-TETRACAINE-BENZOCAINE 2-2-14 % EX AERO
INHALATION_SPRAY | CUTANEOUS | Status: DC | PRN
  Administered 2018-01-26: 2 via TOPICAL

## 2018-01-26 MED ORDER — FENTANYL CITRATE (PF) 100 MCG/2ML IJ SOLN
INTRAMUSCULAR | Status: AC
  Filled 2018-01-26: qty 2

## 2018-01-26 MED ORDER — DIPHENHYDRAMINE HCL 50 MG/ML IJ SOLN
INTRAMUSCULAR | Status: AC
  Filled 2018-01-26: qty 1

## 2018-01-26 MED ORDER — FENTANYL CITRATE (PF) 100 MCG/2ML IJ SOLN
INTRAMUSCULAR | Status: DC | PRN
  Administered 2018-01-26: 50 ug via INTRAVENOUS

## 2018-01-26 NOTE — Progress Notes (Addendum)
  Echocardiogram TEE has been performed.  Roosvelt MaserLane, Vennela Jutte F 01/26/2018, 10:51 AM

## 2018-01-26 NOTE — Discharge Instructions (Signed)
Transesophageal Echocardiogram °Transesophageal echocardiography (TEE) is a picture test of your heart using sound waves. The pictures taken can give very detailed pictures of your heart. This can help your doctor see if there are problems with your heart. TEE can check: °· If your heart has blood clots in it. °· How well your heart valves are working. °· If you have an infection on the inside of your heart. °· Some of the major arteries of your heart. °· If your heart valve is working after a repair. °· Your heart before a procedure that uses a shock to your heart to get the rhythm back to normal. ° °What happens before the procedure? °· Do not eat or drink for 6 hours before the procedure or as told by your doctor. °· Make plans to have someone drive you home after the procedure. Do not drive yourself home. °· An IV tube will be put in your arm. °What happens during the procedure? °· You will be given a medicine to help you relax (sedative). It will be given through the IV tube. °· A numbing medicine will be sprayed or gargled in the back of your throat to help numb it. °· The tip of the probe is placed into the back of your mouth. You will be asked to swallow. This helps to pass the probe into your esophagus. °· Once the tip of the probe is in the right place, your doctor can take pictures of your heart. °· You may feel pressure at the back of your throat. °What happens after the procedure? °· You will be taken to a recovery area so the sedative can wear off. °· Your throat may be sore and scratchy. This will go away slowly over time. °· You will go home when you are fully awake and able to swallow liquids. °· You should have someone stay with you for the next 24 hours. °· Do not drive or operate machinery for the next 24 hours. °This information is not intended to replace advice given to you by your health care provider. Make sure you discuss any questions you have with your health care provider. °Document  Released: 08/14/2009 Document Revised: 03/24/2016 Document Reviewed: 04/18/2013 °Elsevier Interactive Patient Education © 2018 [employer] ° °

## 2018-01-26 NOTE — CV Procedure (Signed)
TEE: Indications: CVA  TEE: Under moderate sedation, TEE was performed without complications: LV: Normal. Normal EF. RV: Normal LA: Normal. Left atrial appendage: Normal without thrombus. Normal function.   Inter atrial septum Moderate sized PFO with moderately positive right to left shunting by double contrast with Valsalva strain.  RA: Normal  MV: Normal Trace MR. TV: Normal Trace TR AV: Normal. No AI or AS. PV: Normal. Trace PI.  Thoracic and ascending aorta: Very mild atherosclerotic changes of the thoracic aorta. A Calcific spicule measuring 0.5x0.2 mm noted in arch. No thrombus and sessile.   Conscious sedation protocol was followed, I personally administered conscious sedation and monitored the patient. Patient received 3 milligrams of Versed and 50 mcg fentanyl . Patient tolerated the procedure well and there was no complication from conscious sedation. Time administered was 18 min.

## 2018-01-26 NOTE — Interval H&P Note (Signed)
History and Physical Interval Note:  01/26/2018 10:00 AM  Raymond Lowery  has presented today for surgery, with the diagnosis of HISTORY OF CVA  The various methods of treatment have been discussed with the patient and family. After consideration of risks, benefits and other options for treatment, the patient has consented to  Procedure(s): TRANSESOPHAGEAL ECHOCARDIOGRAM (TEE) (N/A) as a surgical intervention .  The patient's history has been reviewed, patient examined, no change in status, stable for surgery.  I have reviewed the patient's chart and labs.  Questions were answered to the patient's satisfaction.     Yates DecampJay Javeion Cannedy

## 2018-01-28 ENCOUNTER — Encounter (HOSPITAL_COMMUNITY): Payer: Self-pay | Admitting: Cardiology

## 2018-01-29 ENCOUNTER — Ambulatory Visit (HOSPITAL_COMMUNITY): Payer: 59

## 2018-02-06 ENCOUNTER — Ambulatory Visit (HOSPITAL_COMMUNITY)
Admission: RE | Admit: 2018-02-06 | Discharge: 2018-02-06 | Disposition: A | Discharge status: Home / Self Care | Payer: 59 | Source: Ambulatory Visit | Attending: Internal Medicine | Admitting: Internal Medicine

## 2018-02-06 DIAGNOSIS — R932 Abnormal findings on diagnostic imaging of liver and biliary tract: Secondary | ICD-10-CM | POA: Insufficient documentation

## 2018-02-06 DIAGNOSIS — B182 Chronic viral hepatitis C: Secondary | ICD-10-CM | POA: Insufficient documentation

## 2018-02-08 ENCOUNTER — Ambulatory Visit: Payer: 59 | Admitting: Internal Medicine

## 2018-02-13 ENCOUNTER — Encounter: Payer: Self-pay | Admitting: Adult Health

## 2018-02-13 ENCOUNTER — Ambulatory Visit: Payer: 59 | Admitting: Adult Health

## 2018-02-13 VITALS — BP 123/58 | HR 67 | Ht 71.0 in | Wt 173.0 lb

## 2018-02-13 DIAGNOSIS — I10 Essential (primary) hypertension: Secondary | ICD-10-CM | POA: Diagnosis not present

## 2018-02-13 DIAGNOSIS — I63511 Cerebral infarction due to unspecified occlusion or stenosis of right middle cerebral artery: Secondary | ICD-10-CM

## 2018-02-13 MED ORDER — VARENICLINE TARTRATE 0.5 MG X 11 & 1 MG X 42 PO MISC: ORAL | 0 refills | Status: DC

## 2018-02-13 NOTE — Progress Notes (Signed)
[facility] [address] [place]. [place] [address] [phone]       OFFICE FOLLOW-UP NOTE  Raymond Lowery Date of Birth:  01-12-1956 Medical Record Number:  [id number]   HPI: Raymond Lowery is a 62 year old Caucasian male seen today for first office follow-up visit for in-hospital admission for stroke in December 2018. He is accompanied by his wife. History is obtained from them as well as review of electronic medical records. I have personally reviewed imaging films.Raymond Lowery a 61 y.o.malewith a history of tobacco abuse who presents with left sided weakness. He had transient symptoms this morning and subsequently improved. He actually worked for most of the day, but then hais wife saw him reaching with his right arm because he could not use his left arm well.She therefore called 911. She states that his left side has seemed to get somewhat better in the interim, but he still has significant deficits. [id number] given?: no,outside of window.Raymond Lowery was felt to have clinical signs of large vessel occlusion and hence underwent Emergent large vessel occlusion of the right middle cerebral artery at its origin, in addition to long segment occlusion/critical stenosis of the right internal carotid artery beginning at the carotid bifurcation with a CT angiographic string sign extending to the skullbase, where the artery is completely occluded CT perfusion which showedlarge area of penumbra with right M1 occlusion,suspectedsecondary to carotid disease emergent cerebral catheter angiogram that showed right M1 occlusion and after written consent Raymond Lowery underwent emergent revascularization with the mechanical embolectomy with complete revascularization of the right middle cerebral artery with solitaire retrieval device and right ICA stent assisted angioplasty achieving.TICI 2b reperfusion . He was admitted to the intensive care unit where blood pressure was tightly  controlled. He was extubated and did well. MRI scan of the brain showed a right MCA infarct involving basal ganglia, right insular and frontal operculum cortex. Transthoracic echo showed normal ejection fraction. LDL cholesterol was 69 mg percent and hemoglobin A1c was 5.3. Raymond Lowery was counseled to quit smoking and started on aspirin and Plavix given his recent carotid stent He was discharged home and states his done well. He has cut back smoking significantly but still smokes 4-6 cigarettes per day but plans to quit soon. His blood pressure is well controlled today is 120/68. Is tolerating aspirin and Plavix without significant bleeding or bruising. He is tolerating Lipitor well without muscle aches and pains. His began practically full strength on the left side except some diminished fine motor skills in the left hand. He wants to return to work and driving.   Update 02/03/18: Raymond Lowery returns today for follow-up visit and is accompanied by his wife.  Continues to take aspirin and Plavix with mild bruising but no bleeding.  Continues to take Lipitor without side effects of myalgias.  Overall Raymond Lowery is doing well currently he stroke standpoint.  He does have complaints of left leg jerking and left arm jerking that has been occurring for the past couple months but only occurs a few times as soon as he wakes up and he is able to control it can stop the jerking.  Also have complaints of intermittent leg cramps only while ambulating and resolves immediately with rest.  From symptom description, this sounds more likely intermittent claudication and statin myalgia advised him to speak to cardiologist.  Recent TEE on 01/26/18 which did show PFO.  Wife and Raymond Lowery also inquiring about possible need for loop recorder and interested in doing this before PFO surgery.  He has an appointment with Dr. Jacinto Lowery on 02/15/2018 and he will speak to him regarding these concerns.  RoPE score 3 points with a 0% chance stroke is related to  PFO and a 20% risk of recurrence within 2 years for stroke/TIA.  He states he has been attempting to quit smoking where he smokes approximately half a pack per day but also use chewing tobacco.  He does not want to use the Wellbutrin that was prescribed to him as he has tried this before and it did not work.  Recommended use of Chantix which Raymond Lowery has never tried and willing to try this for smoking cessation.  Raymond Lowery continues to stay active and try to eat a healthy diet. blood pressure at today's visit satisfactory 123/58.  Denies new or worsening stroke/TIA symptoms.  ROS:   14 system review of systems is positive for fatigue, headache, bruise easily and all other systems negative  PMH:  Past Medical History:  Diagnosis Date  . Stroke [facility])     Social History:  Social History   Socioeconomic History  . Marital status: Married    Spouse name: Not on file  . Number of children: Not on file  . Years of education: Not on file  . Highest education level: Not on file  Occupational History  . Not on file  Social Needs  . Financial resource strain: Not on file  . Food insecurity:    Worry: Not on file    Inability: Not on file  . Transportation needs:    Medical: Not on file    Non-medical: Not on file  Tobacco Use  . Smoking status: Current Every Day Smoker    Packs/day: 1.00    Years: 40.00    Pack years: 40.00    Types: Cigarettes  . Smokeless tobacco: Former Engineer, water and Sexual Activity  . Alcohol use: No    Frequency: Never  . Drug use: No  . Sexual activity: Yes  Lifestyle  . Physical activity:    Days per week: Not on file    Minutes per session: Not on file  . Stress: Not on file  Relationships  . Social connections:    Talks on phone: Not on file    Gets together: Not on file    Attends religious service: Not on file    Active member of club or organization: Not on file    Attends meetings of clubs or organizations: Not on file    Relationship  status: Not on file  . Intimate partner violence:    Fear of current or ex partner: Not on file    Emotionally abused: Not on file    Physically abused: Not on file    Forced sexual activity: Not on file  Other Topics Concern  . Not on file  Social History Narrative  . Not on file    Medications:   Current Outpatient Medications on File Prior to Visit  Medication Sig Dispense Refill  . aspirin 325 MG tablet Take 1 tablet (325 mg total) by mouth daily with breakfast. 30 tablet 1  . atorvastatin (LIPITOR) 40 MG tablet Take 1 tablet (40 mg total) by mouth daily at 6 PM. 90 tablet 1  . clopidogrel (PLAVIX) 75 MG tablet Take 1 tablet (75 mg total) by mouth daily with breakfast. 90 tablet 1   No current facility-administered medications on file prior to visit.     Allergies:  No Known Allergies  Physical Exam  General: well developed, well nourished middle-age Caucasian male, seated, in no evident distress Head: head normocephalic and atraumatic.  Neck: supple with no carotid or supraclavicular bruits Cardiovascular: regular rate and rhythm, no murmurs Musculoskeletal: no deformity Skin:  no rash/petichiae Vascular:  Normal pulses all extremities Vitals:   02/13/18 1446  BP: (!) 123/58  Pulse: 67   Neurologic Exam Mental Status: Awake and fully alert. Oriented to place and time. Recent and remote memory intact. Attention span, concentration and fund of knowledge appropriate. Mood and affect appropriate.  Cranial Nerves: Fundoscopic exam reveals sharp disc margins. Pupils equal, briskly reactive to light. Extraocular movements full without nystagmus. Visual fields full to confrontation. Hearing intact. Facial sensation intact. Face, tongue, palate moves normally and symmetrically.  Motor: Normal bulk and tone. Normal strength in all tested extremity muscles. Diminished fine finger movements on the left. Mild left grip weakness. Orbits right over left upper extremity. Sensory.: intact  to touch ,pinprick .position and vibratory sensation.  Coordination: Rapid alternating movements normal in all extremities. Finger-to-nose and heel-to-shin performed accurately bilaterally. Gait and Station: Arises from chair without difficulty. Stance is normal. Gait demonstrates normal stride length and balance . Able to heel, toe and tandem walk without difficulty.  Reflexes: 1+ and symmetric. Toes downgoing.    ASSESSMENT:  62 year old Caucasian male with Right MCA infarct due to right MCA occlusion due to artert to artery embolism from proximal right ICA stenosis treated with mechanical embolectomy  with complete recanalization of right MCA and with rescue right ICA stenting and excellent clinical outcome. Multiple vascular risk factors of carotid stenosis, smoking, hypertension, hyperlipidemia.  Returns today for follow-up visit and overall doing well from a stroke standpoint.   PLAN: -Continue aspirin 325 and Plavix daily and Lipitor for secondary stroke prevention -Raymond Lowery will continue aspirin and Plavix until 04/11/2018 (6 months post stent placement) and then will continue on aspirin only -f/u Dr. Jacinto HalimGanji regarding PFO, loop recorder and possible intermittent claudication -Smoking cessation -prescribed Chantix starter pack -Monitor BP at home -Maintain strict control of hypertension with blood pressure goal below 130/90, diabetes with hemoglobin A1c goal below 6.5% and cholesterol with LDL cholesterol (bad cholesterol) goal below 70 mg/dL. I also advised the Raymond Lowery to eat a healthy diet with plenty of whole grains, cereals, fruits and vegetables, exercise regularly and maintain ideal body weight.  Followup in the future with me in 6 months or call earlier if needed  Greater than 50% time during this 25 minute consultation visit was spent on counseling and coordination of care about HLD, HTN and DM (risk factors), discussion about risk benefit of anticoagulation and answering questions.     George HughJessica Talya Quain, AGNP-BC  [facility] [address] [place], [place] [id number]  Phone [phone] Fax [phone]

## 2018-02-13 NOTE — Patient Instructions (Addendum)
Continue aspirin 325 mg daily and clopidogrel 75 mg daily  and lipitor  for secondary stroke prevention  Follow up with Dr. Nadara EatonGangi regarding PFO, loop recorder, and possible intermittent claudication   Continue to try to quit smoking - start Chantix and follow instructions  Continue to monitor blood pressure at home  Maintain strict control of hypertension with blood pressure goal below 130/90, diabetes with hemoglobin A1c goal below 6.5% and cholesterol with LDL cholesterol (bad cholesterol) goal below 70 mg/dL. I also advised the patient to eat a healthy diet with plenty of whole grains, cereals, fruits and vegetables, exercise regularly and maintain ideal body weight.  Followup in the future with me in 6 months or call earlier if needed

## 2018-02-14 ENCOUNTER — Telehealth: Payer: Self-pay

## 2018-02-14 NOTE — Telephone Encounter (Signed)
PA completed for chantix starter pack, sent to [facility]. It does not appear that phas tried and failed wellbutrin. Pt has tried nicotine patch. Should have a determination in 1-3 business days.

## 2018-02-15 NOTE — Telephone Encounter (Signed)
Rn receive PA approve for Chantix till 02/15/2019 from [facility].

## 2018-02-20 ENCOUNTER — Ambulatory Visit: Payer: 59 | Admitting: Internal Medicine

## 2018-02-20 ENCOUNTER — Encounter: Payer: Self-pay | Admitting: Internal Medicine

## 2018-02-20 DIAGNOSIS — K7400 Hepatic fibrosis, unspecified: Secondary | ICD-10-CM

## 2018-02-20 DIAGNOSIS — I739 Peripheral vascular disease, unspecified: Secondary | ICD-10-CM | POA: Diagnosis not present

## 2018-02-20 DIAGNOSIS — Z720 Tobacco use: Secondary | ICD-10-CM | POA: Diagnosis not present

## 2018-02-20 DIAGNOSIS — B182 Chronic viral hepatitis C: Secondary | ICD-10-CM | POA: Diagnosis not present

## 2018-02-20 DIAGNOSIS — I63231 Cerebral infarction due to unspecified occlusion or stenosis of right carotid arteries: Secondary | ICD-10-CM

## 2018-02-20 MED ORDER — ROSUVASTATIN CALCIUM 10 MG PO TABS: 10.0000 mg | ORAL_TABLET | Freq: Every day | ORAL | 5 refills | Status: DC

## 2018-02-20 MED ORDER — SOFOSBUVIR-VELPATASVIR 400-100 MG PO TABS: 1.0000 | ORAL_TABLET | Freq: Every day | ORAL | 2 refills | Status: DC

## 2018-02-20 MED ORDER — GLECAPREVIR-PIBRENTASVIR 100-40 MG PO TABS: 3.0000 | ORAL_TABLET | Freq: Every day | ORAL | 1 refills | Status: DC

## 2018-02-20 NOTE — Assessment & Plan Note (Signed)
I discussed treatment options and will use Epclusa for 12 weeks.  He will be changed to Crestor due to an interaction with atorvastatin. He will be called as soon as the medication is approved.

## 2018-02-20 NOTE — Progress Notes (Signed)
   Subjective:    Patient ID: Raymond Lowery, male    DOB: 18-Apr-1956, 62 y.o.   MRN: [id number]  HPI Here for follow up of chronic hepatitis C.  He has a recent history of CVA followed by neurology and cardiology.  He had his labs already and has genotype 1a and elastography F2/3 and ultrasound with possible cirrhosis vs fatty liver.  He is taking atorvastatin.  No new issues.  No associated rash.    Review of Systems  Constitutional: Negative for unexpected weight change.  Gastrointestinal: Negative for diarrhea.  Skin: Negative for rash.       Objective:   Physical Exam  Constitutional: He appears well-developed and well-nourished. No distress.  HENT:  Mouth/Throat: No oropharyngeal exudate.  Eyes: No scleral icterus.  Cardiovascular: Normal rate, regular rhythm and normal heart sounds.  No murmur heard. Pulmonary/Chest: Effort normal and breath sounds normal. No respiratory distress.  Skin: No rash noted.   SH: continues to smoke       Assessment & Plan:

## 2018-02-20 NOTE — Patient Instructions (Signed)
Date 02/20/18  Dear Raymond Lowery, As discussed in the ID Clinic, your hepatitis C therapy will include the following medications:    sofosbuvir 400 mg/velpatasvir 100 mg (Epclusa) oral daily  Please note that ALL MEDICATIONS WILL START ON THE SAME DATE for a total of 12 weeks. ---------------------------------------------------------------- Your HCV Treatment Start Date: TBA   Your HCV genotype: 1b    Liver Fibrosis: F2    ---------------------------------------------------------------- YOUR PHARMACY CONTACT (depending on your insurance):   [facility] [address] [place], [place] [address] Phone: [phone] Hours: Monday to Friday 7:30 am to 6:00 pm   Please always contact your pharmacy at least 3-4 business days before you run out of medications to ensure your next month's medication is ready or 1 week prior to running out if you receive it by mail.  Remember, each prescription is for 28 days. ---------------------------------------------------------------- GENERAL NOTES REGARDING YOUR HEPATITIS C MEDICATION:  SOFOSBUVIR (SOVALDI or EPCLUSA): - Sofosbuvir 400 mg tablet is taken daily with OR without food. - The sofosbuvir tablets are yellow. - The Epclusa tablets are pink, diamond-shaped - The tablets should be stored at room temperature. - The most common side effects with sofosbuvir or Epclusa include:      1. Fatigue      2. Headache      3. Nausea      4. Diarrhea      5. Insomnia  - Acid reducing agents such as H2 blockers (ie. Pepcid (famotidine), Zantac (ranitidine), Tagamet (cimetidine), Axid (nizatidine) and proton pump inhibitors (ie. Prilosec (omeprazole), Protonix (pantoprazole), Nexium (esomeprazole), or Aciphex (rabeprazole)) can decrease effectiveness of Harvoni. Do not take until you have discussed with a health care provider.    -Antacids that contain magnesium and/or aluminum hydroxide (ie. Milk of Magensia, Rolaids, Gaviscon, Maalox,  Mylanta, an dArthritis Pain Formula)can reduce absorption of sofosbuvir, so take them at least 4 hours before or after Harvoni.  -Calcium carbonate (calcium supplements or antacids such as Tums, Caltrate, Os-Cal)needs to be taken at least 4 hours hours before or after sofosbuvir.  -St. John's wort or any products that contain St. John's wort like some herbal supplements  Please inform the office prior to starting any of these medications.   Please note that this only lists the most common side effects and is NOT a comprehensive list of the potential side effects of these medications. For more information, please review the drug information sheets that come with your medication package from the pharmacy.  ---------------------------------------------------------------- GENERAL HELPFUL HINTS ON HCV THERAPY: 1. No alcohol. 2. Stay well-hydrated 3. Notify the ID Clinic of any changes in your other over-the-counter/herbal or prescription medications. 4. If you miss a dose of your medication, take the missed dose as soon as you remember. Return to your regular time/dose schedule the next day.  5.  Do not stop taking your medications without first talking with your healthcare provider. 6.  You may take Tylenol (acetaminophen), as long as the dose is less than 2000 mg (OR no more than 4 tablets of the Tylenol Extra Strengths 500mg  tablet) in 24 hours. 7. You will follow up with our clinic pharmacist initially after starting the medication to monitor for any possible side effects 8. You will get labs once during treatment, soon after treatment completion and again 6 months or more after treatment completion to verify the virus is completely gone.   Gardiner Barefootobert W Timberlynn Kizziah, MD  [facility] [address] [place], [place]  [address] [phone]

## 2018-02-20 NOTE — Assessment & Plan Note (Signed)
I encouraged smoking cessation.  No interaction with epclusa and Chantix so ok to take concurrently.

## 2018-02-20 NOTE — Assessment & Plan Note (Signed)
On Plavix and no interaction.

## 2018-02-20 NOTE — Addendum Note (Signed)
Addended by: Gardiner BarefootOMER, Omarian Jaquith W on: 02/20/2018 12:32 PM   Modules accepted: Orders

## 2018-02-20 NOTE — Assessment & Plan Note (Signed)
F2/3 on elastography, F2 on Fibrosure.  No HCC screening indicated.

## 2018-02-20 NOTE — Assessment & Plan Note (Signed)
Changing to Crestor and I have discussed this with Dr. Pearlean BrownieSethi and Jacinto HalimGanji.   Will check a lipid panel in about 2 months.

## 2018-03-11 DIAGNOSIS — I739 Peripheral vascular disease, unspecified: Secondary | ICD-10-CM

## 2018-03-11 NOTE — H&P (Signed)
Raymond Lowery Dec 26, 2017 2:30 PM Location: [facility] Patient #: [id number] DOB: [birth year] Married / Language: [place] / Race: White Male   History of Present Illness Suzy Bouchard FNP-C; 12/23/2017 7:53 AM) Patient words: NP EVAL for syncope, event monitor worn.  The patient is a 62 year old male who presents with a cerebrovascular accident. Patient with past medical history of hypertension, hyperlipidemia, and tobacco use presents for evaluation after he was recently admitted to the hospital on 10/09/2017 for CVA. Patient had presented to the emergency room with left-sided weakness and left facial droop. Patient's wife states that on the morning of his CVA he had had transient symptoms that subsequently improved and he actually worked most of the day; however, later that day was found to have left arm weakness and was brought to the emergency room for further evaluation. He was found to have right MCA occlusion. Underwent cerebral angiogram with complete revascularizstion of occluded RT MCA and terminal RT ICA x1 pass with solitaire 4mm x 40 mm retrieval device achieving a TICI 2b reperfusion and complete revascularization of acutelly occluded Rt ICA with stent assisted angioplasty by Dr. Corliss Skains. Loop recorder implantation was discussed at the hospital; however, patient and his spouse refused at that time. Patient fortunately did not have any residual deficits after the procedure. He was started on atorvastatin, Plavix, and aspirin and was discharged home on 10/11/2017. Patient does report one episode of fall after he felt dizzy after he got home while using the bathroom and he went back to the emergency room on 11/24/2016; however, workup was negative. He was placed on 30 day event monitor by his PCP that showed normal sinus rhythm with artifact; however, there was sleep loss and it was recommended repeat testing. He now presents for cardiac examination given his recent  CVA.  Patient reports his blood pressure has been very well controlled and has actually been low at times. Cholesterol levels have been stable. He currently smokes 1-1/2-2 packs per day. No specific complaints today. Denies any chest pain, shortness of breath, recurrence of syncope, palpitations. He does state that he has been feeling tired over the last few months. Does have leg tightness and fatigue with walking even short amount of distance. No ulcerations present. Patient reports prior to his stroke a week before he had fallen and hurt his knee and is wondering if the clot had come from this. He works as a Naval architect.   Problem List/Past Medical (April Harrington; 2017/12/26 2:13 PM) Laboratory examination (Z01.89)  History of stroke (Z86.73)  Hyperlipidemia, mixed (E78.2)   Allergies (April Harrington; 12-26-17 2:14 PM) No Known Drug Allergies [26-Dec-2017]:  Family History (April Harrington; 12-26-17 2:18 PM) Mother  Deceased. at age 49 from infection of some type, copd, thyroid disease, no heart attacks or strokes, no known cardiovascular conditions Father  In stable health. heart murmur, pacemaker, no strokes, no known cardiovascular conditions Sister 3  1 older, 2 younger- no heart attacks or strokes, no known cardiovascular conditions Brother 1  older- no heart attacks or strokes, no known cardiovascular conditions  Social History (April Harrington; 26-Dec-2017 2:15 PM) Current tobacco use  Smoker, current status unknown. 1-1.5ppd since age 50 Non Drinker/No Alcohol Use  has not had a drink since 10/09/17 Marital status  Married. Living Situation  Lives with spouse. Number of Children  2.  Past Surgical History (April Harrington; 26-Dec-2017 2:21 PM) Neck Surgery  in his 40's Surgery at age 61  Due to tractor  accident Knee Replacement, Total  in 90's  Medication History (April Harrington; 12/21/2017 2:28 PM) [facility] Aspirin (  Tablet, 1 Oral daily)  Active. Clopidogrel Bisulfate (  Tablet, 1 Oral daily) Active. Atorvastatin Calcium (  Tablet, 1 Oral dinner) Active. Medications Reconciled (verbally with pt; medication present)  Diagnostic Studies History (April Harrington; 12/21/2017 2:27 PM) Cardioversion [1995]: Colonoscopy [12/2016]: removed Endoscopy [2018]: Carotid Doppler [10/11/2017]: Abd [place] [12/2017]: Echocardiogram [10/11/2017]: CT Scan of Head [11/24/2017]: Chest X-ray [11/24/2017]: CT Scan of Brain [11/24/2017]: MRA [10/10/2017]:    Review of Systems Suzy Bouchard FNP-C; 12/21/2017 3:37 PM) General Not Present- Appetite Loss and Weight Gain. Respiratory Not Present- Chronic Cough and Wakes up from Sleep Wheezing or Short of Breath. Cardiovascular Present- Claudications. Not Present- Chest Pain, Difficulty Breathing Lying Down, Difficulty Breathing On Exertion, Edema and Palpitations. Gastrointestinal Not Present- Black, Tarry Stool and Difficulty Swallowing. Musculoskeletal Not Present- Decreased Range of Motion and Muscle Atrophy. Neurological Present- Decreased Memory (since CVA) and Syncope (few episodes of near syncope). Not Present- Attention Deficit. Psychiatric Not Present- Personality Changes and Suicidal Ideation. Endocrine Not Present- Cold Intolerance and Heat Intolerance. Hematology Not Present- Abnormal Bleeding. All other systems negative  Vitals (April Harrington; 12/21/2017 2:43 PM) 12/21/2017 2:42 PM Weight: 172.44 lb Height: 71in Body Surface Area: 1.98 m Body Mass Index: 24.05 kg/m  Pulse: 82 (Regular)  P.OX: 97% (Room air) BP: 116/62 (Standing, Left Arm, Standard)    12/21/2017 2:41 PM Weight: 172.44 lb Height: 71in Body Surface Area: 1.98 m Body Mass Index: 24.05 kg/m  Pulse: 67 (Regular)  P.OX: 97% (Room air) BP: 123/66 (Sitting, Left Arm, Standard)    12/21/2017 2:09 PM Weight: 172.44 lb Height: 71in Body Surface Area: 1.98 m Body  Mass Index: 24.05 kg/m  Pulse: 63 (Regular)  P.OX: 96% (Room air) BP: 124/61 (Supine, Left Arm, Standard)       Physical Exam Suzy Bouchard FNP-C; 12/23/2017 7:55 AM) General Mental Status-Alert. General Appearance-Cooperative and Appears stated age. Build & Nutrition-Lean and Moderately built.  Head and Neck Thyroid Gland Characteristics - normal size and consistency and no palpable nodules.  Chest and Lung Exam Chest and lung exam reveals -quiet, even and easy respiratory effort with no use of accessory muscles, non-tender and on auscultation, normal breath sounds, no adventitious sounds.  Cardiovascular Cardiovascular examination reveals -normal heart sounds, regular rate and rhythm with no murmurs, carotid auscultation reveals no bruits and abdominal aorta auscultation reveals no bruits and no prominent pulsation.  Abdomen Palpation/Percussion Palpation and Percussion of the abdomen reveal - Non Tender and No hepatosplenomegaly.  Peripheral Vascular Lower Extremity Palpation - Femoral pulse - Bilateral - Normal. Popliteal pulse - Bilateral - Normal. Dorsalis pedis pulse - Bilateral - Feeble. Posterior tibial pulse - Bilateral - Absent. Carotid arteries - Bilateral-No Carotid bruit.  Neurologic Neurologic evaluation reveals -alert and oriented x 3 with no impairment of recent or remote memory. Motor-Grossly intact without any focal deficits.  Musculoskeletal Global Assessment Left Lower Extremity - no deformities, masses or tenderness, no known fractures. Right Lower Extremity - no deformities, masses or tenderness, no known fractures.    Assessment & Plan Suzy Bouchard FNP-C; 12/23/2017 8:00 AM) Encounter for screening for cardiovascular disorders (Z13.6) Story: EKG 12/21/2017: Normal sinus rhythm at the rate of 65 bpm, normal axis. No evidence of ischemia, normal EKG. Current Plans Complete electrocardiogram (93000) History of stroke  (Z61.09) Story: CT of Head 10/09/2017: Endovascular complete revascularization of occluded right internal carotid terminus, and right middle cerebral artery with  1 pass with the Solitaire FR 4 mm x 40 mm retrieval device achieving a TICI 2b reperfusion, followed by endovascular complete revascularization of symptomatic acute occluded right internal carotid artery with stent assisted angioplasty followed by rescue pharmacologic thrombolysis of acute aggregation of platelets within the stent and 12 mg of intra-arterial Integrilin, and also 1 mg of Aggrastat infusion over 10 minutes into the stent itself.  Carotid duplex 10/19/2017: Right Carotid: There is evidence in the right ICA of a 1-39% stenosis. Patent right ICA stent without significant stenosis. Left Carotid: There is evidence in the left ICA of a 1-39% stenosis. Vertebrals: Both vertebral arteries were patent with antegrade flow. Tobacco use (Z72.0) Story: 100 pack year of smoking Current Plans Started BuPROPion HCl ER (SR) , 1 (one) Tablet two times daily, #30, 30 days starting 12/21/2017, Ref. x1. Local Order: one dose in the AM and no later than 6 PM Smoking cessation counseling for greater than 10 minutes (11914) Claudication, class II (I73.9) Future Plans 01/10/2018: Complete duplex ultrasound of arteries of lower extremity (78295) - one time Laboratory examination (Z01.89)  Note:. Recommendation:  Patient is referred to [place] given his recent history of CVA. Patient has recovered from his CVA well, has not had any residual deficits. Have reviewed his hospital records and updated his chart. Echocardiogram during hospitalization was normal. I have a high suspicion that he may have a PFO, we'll set him up for TEE for further evaluation. He does state that prior to CVA he has been feeling more tired than usual. He may have underlying atrial fibrillation and loop recorder may also be a good option if his TEE is negative. He  is on appropriate medical therapy.  Patient does have abnormal vascular exam and symptoms of class II claudication, we will obtain lower extremity arterial duplex. If patient is found to have significant PAD he will also need stress testing for coronary disease. Given his long history of smoking, he likely has significant PAD. Lengthy discussion with the patient regarding smoking cessation given his risk factors and recent CVA. I started the patient on Wellbutrin to help with this. I'll also discuss short-term use of vape to help him completely quit. Wife is present at bedside and say she will help him with this. All questions were answered. We'll see him back after the tests for further recommendations and evaluation. No changes were made to medications today.  This was a greater than 60 minute office visit with greater than 50% of the time spent with face-to-face encounter with patient and evaluation of complex medical issues, review of external records and coordination of care.  *I have discussed this case with Dr. Jacinto Halim and he personally examined the patient and participated in formulating the plan.*  CC: Dr. Mervin Hack; CC: Dr. Julieanne Cotton    Signed by Suzy Bouchard, FNP-C (12/23/2017 8:01 AM)

## 2018-03-13 ENCOUNTER — Ambulatory Visit (HOSPITAL_COMMUNITY): Admit: 2018-03-13 | Payer: 59 | Admitting: Cardiology

## 2018-03-13 ENCOUNTER — Encounter (HOSPITAL_COMMUNITY): Payer: Self-pay

## 2018-03-13 SURGERY — LOWER EXTREMITY ANGIOGRAPHY
Anesthesia: LOCAL | Laterality: Right

## 2018-03-14 NOTE — Progress Notes (Signed)
I reviewed note and agree with plan.   Amoria Mclees R. Britney Newstrom, MD  Certified in Neurology, Neurophysiology and Neuroimaging  [facility] [address], [address] [place], [place] [address] [phone]   

## 2018-03-19 ENCOUNTER — Other Ambulatory Visit: Payer: Self-pay | Admitting: Pharmacist

## 2018-03-19 DIAGNOSIS — B182 Chronic viral hepatitis C: Secondary | ICD-10-CM

## 2018-03-19 MED ORDER — GLECAPREVIR-PIBRENTASVIR 100-40 MG PO TABS: 3.0000 | ORAL_TABLET | Freq: Every day | ORAL | 1 refills | Status: DC

## 2018-03-19 NOTE — Addendum Note (Signed)
Addended by: Robinette Haines on: 03/19/2018 03:55 PM   Modules accepted: Orders

## 2018-03-19 NOTE — Progress Notes (Addendum)
[facility] cannot fill. Sending to [facility].

## 2018-04-02 ENCOUNTER — Other Ambulatory Visit (HOSPITAL_COMMUNITY): Payer: Self-pay | Admitting: Interventional Radiology

## 2018-04-02 ENCOUNTER — Telehealth (HOSPITAL_COMMUNITY): Payer: Self-pay

## 2018-04-02 DIAGNOSIS — I639 Cerebral infarction, unspecified: Secondary | ICD-10-CM

## 2018-04-02 NOTE — Telephone Encounter (Signed)
Called to schedule f/u us carotid, no answer, left vm. AW 

## 2018-04-05 ENCOUNTER — Institutional Professional Consult (permissible substitution): Payer: 59 | Admitting: Neurology

## 2018-04-05 ENCOUNTER — Telehealth: Payer: Self-pay

## 2018-04-05 NOTE — Telephone Encounter (Signed)
Pt did not show for their appt with Dr. Athar today.  

## 2018-04-06 ENCOUNTER — Encounter: Payer: Self-pay | Admitting: Neurology

## 2018-04-06 ENCOUNTER — Ambulatory Visit (HOSPITAL_COMMUNITY)
Admission: RE | Admit: 2018-04-06 | Discharge: 2018-04-06 | Disposition: A | Discharge status: Home / Self Care | Payer: 59 | Source: Ambulatory Visit | Attending: Interventional Radiology | Admitting: Interventional Radiology

## 2018-04-06 DIAGNOSIS — I6522 Occlusion and stenosis of left carotid artery: Secondary | ICD-10-CM | POA: Diagnosis not present

## 2018-04-06 DIAGNOSIS — I639 Cerebral infarction, unspecified: Secondary | ICD-10-CM | POA: Insufficient documentation

## 2018-04-09 ENCOUNTER — Encounter: Payer: Self-pay | Admitting: Pharmacy Technician

## 2018-04-18 ENCOUNTER — Telehealth (HOSPITAL_COMMUNITY): Payer: Self-pay

## 2018-04-18 NOTE — Telephone Encounter (Signed)
Called pt regarding recent us carotid, no answer, left vm. AW 

## 2018-04-18 NOTE — Telephone Encounter (Signed)
Pt agreed to f/u in 6 months with us carotid. AW 

## 2018-04-25 ENCOUNTER — Ambulatory Visit (INDEPENDENT_AMBULATORY_CARE_PROVIDER_SITE_OTHER): Payer: 59 | Admitting: Pharmacist

## 2018-04-25 DIAGNOSIS — B182 Chronic viral hepatitis C: Secondary | ICD-10-CM | POA: Diagnosis not present

## 2018-04-25 NOTE — Progress Notes (Signed)
HPI: Raymond Lowery is a 62 y.o. male who presents to the [facility] pharmacy clinic for Hep C follow-up. He has genotype 1a, F2/F3, and started 8 weeks of Mavyret on 6/4.  Patient Active Problem List   Diagnosis Date Noted  . Peripheral artery disease (HCC) 03/11/2018  . Claudication in peripheral vascular disease (HCC) 02/20/2018  . Tobacco abuse 02/20/2018  . Liver fibrosis 02/20/2018  . Chronic hepatitis C without hepatic coma (HCC) 01/22/2018  . Stroke (cerebrum) (HCC) 10/09/2017    Patient's Medications  New Prescriptions   No medications on file  Previous Medications   ASPIRIN 325 MG TABLET    Take 1 tablet (325 mg total) by mouth daily with breakfast.   CLOPIDOGREL (PLAVIX) 75 MG TABLET    Take 1 tablet (75 mg total) by mouth daily with breakfast.   GLECAPREVIR-PIBRENTASVIR (MAVYRET) 100-40 MG TABS    Take 3 tablets by mouth daily with breakfast.   ROSUVASTATIN (CRESTOR) 10 MG TABLET    Take 1 tablet (10 mg total) by mouth daily.   VARENICLINE (CHANTIX STARTING MONTH PAK) 0.5 MG X 11 & 1 MG X 42 TABLET    0.5 mg once daily for 3 days, then increase to 0.5 mg twice daily for 4 days, then increase to 1 mg tablet twice daily.  Modified Medications   No medications on file  Discontinued Medications   No medications on file    Allergies: No Known Allergies  Past Medical History: Past Medical History:  Diagnosis Date  . Stroke [facility](HCC)     Social History: Social History   Socioeconomic History  . Marital status: Married    Spouse name: Not on file  . Number of children: Not on file  . Years of education: Not on file  . Highest education level: Not on file  Occupational History  . Not on file  Social Needs  . Financial resource strain: Not on file  . Food insecurity:    Worry: Not on file    Inability: Not on file  . Transportation needs:    Medical: Not on file    Non-medical: Not on file  Tobacco Use  . Smoking status: Current Every Day Smoker    Packs/day:  1.00    Years: 40.00    Pack years: 40.00    Types: Cigarettes  . Smokeless tobacco: Former Engineer, waterUser  Substance and Sexual Activity  . Alcohol use: No    Frequency: Never  . Drug use: No  . Sexual activity: Yes  Lifestyle  . Physical activity:    Days per week: Not on file    Minutes per session: Not on file  . Stress: Not on file  Relationships  . Social connections:    Talks on phone: Not on file    Gets together: Not on file    Attends religious service: Not on file    Active member of club or organization: Not on file    Attends meetings of clubs or organizations: Not on file    Relationship status: Not on file  Other Topics Concern  . Not on file  Social History Narrative  . Not on file    Labs: Hepatitis C Lab Results  Component Value Date   Naaman PlummerFIBROSTAGE F2 01/22/2018   Hepatitis B Lab Results  Component Value Date   HEPBSAB NON-REACTIVE 01/22/2018   HEPBCAB NON-REACTIVE 01/22/2018   Hepatitis A Lab Results  Component Value Date   HAV NON-REACTIVE 01/22/2018   HIV  Lab Results  Component Value Date   HIV Non Reactive 10/10/2017   Lab Results  Component Value Date   CREATININE 0.75 01/22/2018   CREATININE 0.77 11/24/2017   CREATININE 0.60 (L) 10/11/2017   CREATININE 0.69 10/10/2017   CREATININE 0.70 10/09/2017   Lab Results  Component Value Date   AST 32 01/22/2018   AST 25 10/09/2017   AST 25 11/03/2010   ALT 41 01/22/2018   ALT 39 01/22/2018   ALT 29 10/09/2017   INR 1.0 01/22/2018   INR 0.95 10/09/2017    Fibrosis Score: F2/F3 as assessed by [facility]   Assessment: Raymond Lowery is here today to follow-up for his Hep C infection.  He started 8 weeks of Mavyret on 6/4.  He takes all three pill at once after dinner and before bed at the same time each day.  He has missed no doses and is having no side effects - no fatigue, nausea, diarrhea, or headaches.  He is tolerating it well.    We changed him to Crestor while on Mavyret and he is also tolerating  that well.  No issues with [facility] pharmacy but he has 4 pills left and hasn't heard from them for his refill.  I told him to call them today to make sure they get his last month to him ASAP so he doesn't miss any doses.  I will check a Hep C viral load, CMET, and lipid panel today. He will come back and see me when he has completed therapy.  He and his wife are requesting to see Dr. Luciana Axe for his cure visit.    Plan: - Continue Mavyret x 8 weeks - Hep C VL, CMET, lipid panel today - F/u with me 8/5 at 315pm.  Raymond Lowery, PharmD, AAHIVP, CPP Infectious Diseases Clinical Pharmacist [facility] 04/25/2018, 3:56 PM

## 2018-04-26 LAB — COMPREHENSIVE METABOLIC PANEL
AG Ratio: 1.6 (calc) (ref 1.0–2.5)
ALT: 9 U/L (ref 9–46)
AST: 12 U/L (ref 10–35)
Albumin: 4.3 g/dL (ref 3.6–5.1)
Alkaline phosphatase (APISO): 74 U/L (ref 40–115)
BILIRUBIN TOTAL: 0.7 mg/dL (ref 0.2–1.2)
BUN: 12 mg/dL (ref 7–25)
CO2: 27 mmol/L (ref 20–32)
CREATININE: 0.85 mg/dL (ref 0.70–1.25)
Calcium: 9.3 mg/dL (ref 8.6–10.3)
Chloride: 106 mmol/L (ref 98–110)
GLUCOSE: 79 mg/dL (ref 65–99)
Globulin: 2.7 g/dL (calc) (ref 1.9–3.7)
POTASSIUM: 4.1 mmol/L (ref 3.5–5.3)
SODIUM: 138 mmol/L (ref 135–146)
TOTAL PROTEIN: 7 g/dL (ref 6.1–8.1)

## 2018-04-26 LAB — LIPID PANEL
Cholesterol: 92 mg/dL (ref <200)
HDL: 33 mg/dL — ABNORMAL LOW (ref >40)
LDL Cholesterol (Calc): 43 mg/dL (calc)
Non-HDL Cholesterol (Calc): 59 mg/dL (calc) (ref <130)
Total CHOL/HDL Ratio: 2.8 (calc) (ref <5.0)
Triglycerides: 79 mg/dL (ref <150)

## 2018-04-27 LAB — HEPATITIS C RNA QUANTITATIVE
HCV Quantitative Log: <1.18 Log IU/mL
HCV RNA, PCR, QN: <15 IU/mL

## 2018-05-31 ENCOUNTER — Other Ambulatory Visit: Payer: Self-pay | Admitting: Neurology

## 2018-06-04 ENCOUNTER — Ambulatory Visit (INDEPENDENT_AMBULATORY_CARE_PROVIDER_SITE_OTHER): Payer: 59 | Admitting: Pharmacist

## 2018-06-04 DIAGNOSIS — I63231 Cerebral infarction due to unspecified occlusion or stenosis of right carotid arteries: Secondary | ICD-10-CM | POA: Diagnosis not present

## 2018-06-04 DIAGNOSIS — B182 Chronic viral hepatitis C: Secondary | ICD-10-CM | POA: Diagnosis not present

## 2018-06-04 MED ORDER — ATORVASTATIN CALCIUM 40 MG PO TABS: 40.0000 mg | ORAL_TABLET | Freq: Every day | ORAL | 5 refills | Status: DC

## 2018-06-04 NOTE — Progress Notes (Signed)
HPI: Raymond Lowery is a 62 y.o. male who presents to the [facility] pharmacy clinic for his end of therapy Hep C visit.  Hepatitis C Genotype: 1a  Fibrosis Score: F2/F3  Hepatitis C RNA: <15  Patient Active Problem List   Diagnosis Date Noted  . Peripheral artery disease (HCC) 03/11/2018  . Claudication in peripheral vascular disease (HCC) 02/20/2018  . Tobacco abuse 02/20/2018  . Liver fibrosis 02/20/2018  . Chronic hepatitis C without hepatic coma (HCC) 01/22/2018  . Stroke (cerebrum) (HCC) 10/09/2017    Patient's Medications  New Prescriptions   ATORVASTATIN (LIPITOR) 40 MG TABLET    Take 1 tablet (40 mg total) by mouth daily.  Previous Medications   ASPIRIN 325 MG TABLET    Take 1 tablet (325 mg total) by mouth daily with breakfast.   CLOPIDOGREL (PLAVIX) 75 MG TABLET    TAKE 1 TABLET (75 MG TOTAL) BY MOUTH DAILY WITH BREAKFAST.   VARENICLINE (CHANTIX STARTING MONTH PAK) 0.5 MG X 11 & 1 MG X 42 TABLET    0.5 mg once daily for 3 days, then increase to 0.5 mg twice daily for 4 days, then increase to 1 mg tablet twice daily.  Modified Medications   No medications on file  Discontinued Medications   GLECAPREVIR-PIBRENTASVIR (MAVYRET) 100-40 MG TABS    Take 3 tablets by mouth daily with breakfast.   ROSUVASTATIN (CRESTOR) 10 MG TABLET    Take 1 tablet (10 mg total) by mouth daily.    Allergies: No Known Allergies  Past Medical History: Past Medical History:  Diagnosis Date  . Stroke [facility](HCC)     Social History: Social History   Socioeconomic History  . Marital status: Married    Spouse name: Not on file  . Number of children: Not on file  . Years of education: Not on file  . Highest education level: Not on file  Occupational History  . Not on file  Social Needs  . Financial resource strain: Not on file  . Food insecurity:    Worry: Not on file    Inability: Not on file  . Transportation needs:    Medical: Not on file    Non-medical: Not on file  Tobacco Use    . Smoking status: Current Every Day Smoker    Packs/day: 1.00    Years: 40.00    Pack years: 40.00    Types: Cigarettes  . Smokeless tobacco: Former Engineer, waterUser  Substance and Sexual Activity  . Alcohol use: No    Frequency: Never  . Drug use: No  . Sexual activity: Yes  Lifestyle  . Physical activity:    Days per week: Not on file    Minutes per session: Not on file  . Stress: Not on file  Relationships  . Social connections:    Talks on phone: Not on file    Gets together: Not on file    Attends religious service: Not on file    Active member of club or organization: Not on file    Attends meetings of clubs or organizations: Not on file    Relationship status: Not on file  Other Topics Concern  . Not on file  Social History Narrative  . Not on file    Labs: Hepatitis C Lab Results  Component Value Date   HCVRNAPCRQN <15 NOT DETECTED 04/25/2018   FIBROSTAGE F2 01/22/2018   Hepatitis B Lab Results  Component Value Date   HEPBSAB NON-REACTIVE 01/22/2018   HEPBCAB  NON-REACTIVE 01/22/2018   Hepatitis A Lab Results  Component Value Date   HAV NON-REACTIVE 01/22/2018   HIV Lab Results  Component Value Date   HIV Non Reactive 10/10/2017   Lab Results  Component Value Date   CREATININE 0.85 04/25/2018   CREATININE 0.75 01/22/2018   CREATININE 0.77 11/24/2017   CREATININE 0.60 (L) 10/11/2017   CREATININE 0.69 10/10/2017   Lab Results  Component Value Date   AST 12 04/25/2018   AST 32 01/22/2018   AST 25 10/09/2017   ALT 9 04/25/2018   ALT 41 01/22/2018   ALT 39 01/22/2018   INR 1.0 01/22/2018   INR 0.95 10/09/2017    Assessment: Raymond Lowery is here today to follow-up for his Hep C infection. He completed 8 weeks of Mavyret recently with no missed doses and no side effects.  His early on treatment viral load was already undetectable.  Discussed his labs and what they mean to him and his wife today.  They are very excited about his lipid panel, CMET, and viral  load.  Will check another viral load today and see him back in 3 months for his cure visit.  He is requesting to go back to Lipitor since he has finished with Mavyret.  [facility]   Plan: - Completed 8 weeks of Mavyret - Hep C VL today - Stop Crestor - Switch back to Lipitor 40 mg PO once daily - F/u with me 11/5 at 315pm  Cassie L. Kuppelweiser, PharmD, AAHIVP, CPP Infectious Diseases Clinical Pharmacist [facility] 06/04/2018, 3:36 PM

## 2018-06-06 ENCOUNTER — Telehealth: Payer: Self-pay | Admitting: Pharmacist

## 2018-06-06 LAB — HEPATITIS C RNA QUANTITATIVE
HCV QUANT LOG: NOT DETECTED {Log_IU}/mL
HCV RNA, PCR, QN: <15 IU/mL

## 2018-06-06 NOTE — Telephone Encounter (Signed)
Called patient's wife to let them know that patient's Hep C viral load was still undetectable at end of therapy.  We will check again in 3 months for cure.

## 2018-06-27 ENCOUNTER — Ambulatory Visit: Payer: 59 | Admitting: Internal Medicine

## 2018-08-15 ENCOUNTER — Encounter: Payer: Self-pay | Admitting: Adult Health

## 2018-08-15 ENCOUNTER — Ambulatory Visit: Payer: 59 | Admitting: Adult Health

## 2018-08-15 VITALS — BP 110/63 | HR 62 | Wt 170.8 lb

## 2018-08-15 DIAGNOSIS — I63231 Cerebral infarction due to unspecified occlusion or stenosis of right carotid arteries: Secondary | ICD-10-CM

## 2018-08-15 DIAGNOSIS — E785 Hyperlipidemia, unspecified: Secondary | ICD-10-CM | POA: Diagnosis not present

## 2018-08-15 DIAGNOSIS — I739 Peripheral vascular disease, unspecified: Secondary | ICD-10-CM

## 2018-08-15 DIAGNOSIS — I10 Essential (primary) hypertension: Secondary | ICD-10-CM

## 2018-08-15 MED ORDER — ATORVASTATIN CALCIUM 40 MG PO TABS: 40.0000 mg | ORAL_TABLET | Freq: Every day | ORAL | 3 refills | Status: DC

## 2018-08-15 NOTE — Patient Instructions (Addendum)
Continue aspirin 325 mg daily  and lipitor 40mg   for secondary stroke prevention  Stop plavix at this time and continue on aspirin only as you have completed greater than 6 months therapy  Follow up with Dr. Corliss Skains as scheduled  Continue to follow up with PCP regarding cholesterol and blood pressure management  Continue to stay active   Continue to monitor blood pressure at home  Maintain strict control of hypertension with blood pressure goal below 130/90, diabetes with hemoglobin A1c goal below 6.5% and cholesterol with LDL cholesterol (bad cholesterol) goal below 70 mg/dL. I also advised the patient to eat a healthy diet with plenty of whole grains, cereals, fruits and vegetables, exercise regularly and maintain ideal body weight.  Followup in the future with me in 6 months or call earlier if needed       Thank you for coming to see [place] at [facility]. I hope we have been able to provide you high quality care today.  You may receive a patient satisfaction survey over the next few weeks. We would appreciate your feedback and comments so that we may continue to improve ourselves and the health of our patients.

## 2018-08-15 NOTE — Progress Notes (Signed)
[facility] [address] [place]. [place] [address] [phone]       OFFICE FOLLOW-UP NOTE  [name] Date of Birth:  01-12-1956 Medical Record Number:  [id number]   HPI: [name] is a 62 year old Caucasian male seen today for first office follow-up visit for in-hospital admission for stroke in December 2018. He is accompanied by his wife. History is obtained from them as well as review of electronic medical records. I have personally reviewed imaging films.Raymond Fason [name] a 61 y.o.malewith a history of tobacco abuse who presents with left sided weakness. He had transient symptoms this morning and subsequently improved. He actually worked for most of the day, but then hais wife saw him reaching with his right arm because he could not use his left arm well.She therefore called 911. She states that his left side has seemed to get somewhat better in the interim, but he still has significant deficits. OZH:[id number] given?: no,outside of window.Patient was felt to have clinical signs of large vessel occlusion and hence underwent Emergent large vessel occlusion of the right middle cerebral artery at its origin, in addition to long segment occlusion/critical stenosis of the right internal carotid artery beginning at the carotid bifurcation with a CT angiographic string sign extending to the skullbase, where the artery is completely occluded CT perfusion which showedlarge area of penumbra with right M1 occlusion,suspectedsecondary to carotid disease emergent cerebral catheter angiogram that showed right M1 occlusion and after written consent patient underwent emergent revascularization with the mechanical embolectomy with complete revascularization of the right middle cerebral artery with solitaire retrieval device and right ICA stent assisted angioplasty achieving.TICI 2b reperfusion . He was admitted to the intensive care unit where blood pressure was tightly  controlled. He was extubated and did well. MRI scan of the brain showed a right MCA infarct involving basal ganglia, right insular and frontal operculum cortex. Transthoracic echo showed normal ejection fraction. LDL cholesterol was 69 mg percent and hemoglobin A1c was 5.3. Patient was counseled to quit smoking and started on aspirin and Plavix given his recent carotid stent He was discharged home and states his done well. He has cut back smoking significantly but still smokes 4-6 cigarettes per day but plans to quit soon. His blood pressure is well controlled today is 120/68. Is tolerating aspirin and Plavix without significant bleeding or bruising. He is tolerating Lipitor well without muscle aches and pains. His began practically full strength on the left side except some diminished fine motor skills in the left hand. He wants to return to work and driving.   Update 02/03/18: Patient returns today for follow-up visit and is accompanied by his wife.  Continues to take aspirin and Plavix with mild bruising but no bleeding.  Continues to take Lipitor without side effects of myalgias.  Overall patient is doing well currently he stroke standpoint.  He does have complaints of left leg jerking and left arm jerking that has been occurring for the past couple months but only occurs a few times as soon as he wakes up and he is able to control it can stop the jerking.  Also have complaints of intermittent leg cramps only while ambulating and resolves immediately with rest.  From symptom description, this sounds more likely intermittent claudication and statin myalgia advised him to speak to cardiologist.  Recent TEE on 01/26/18 which did show PFO.  Wife and patient also inquiring about possible need for loop recorder and interested in doing this before PFO surgery.  He has an appointment with Dr. Jacinto Halim on 02/15/2018 and he will speak to him regarding these concerns.  RoPE score 3 points with a 0% chance stroke is related to  PFO and a 20% risk of recurrence within 2 years for stroke/TIA.  He states he has been attempting to quit smoking where he smokes approximately half a pack per day but also use chewing tobacco.  He does not want to use the Wellbutrin that was prescribed to him as he has tried this before and it did not work.  Recommended use of Chantix which patient has never tried and willing to try this for smoking cessation.  Patient continues to stay active and try to eat a healthy diet. blood pressure at today's visit satisfactory 123/58.  Denies new or worsening stroke/TIA symptoms.  Interval History 08/15/18: Patient is being seen today for scheduled follow-up visit and is accompanied by his wife.  States he has been stable from a stroke standpoint except for mild memory loss and continued complaints of intermittent claudication.  He was seen by Dr. Jacinto Halim who was highly in favor PFO closure but Dr. Pearlean Brownie did not agree with this as he felt as though his stroke was not related to the PFO.  At that time, he was still on DAPT for stent placement.  It was recommended to have lower extremity arterial Dopplers performed but patient canceled this appointment and never rescheduled.  Wife does state they have an appointment with a different cardiologist on 10/04/2018 for a "second opinion" regarding possible PAD.  Per Dr. Verl Dicker notes, he did feel as though he most likely had PAD the patient never followed through with testing.  He does continue to work full-time without complication.  He has continued on both aspirin and Plavix despite recommendation of stopping Plavix in 03/2018 has at that time he would have completed 55-month therapy but despite prolonged continuation of DAPT, he denies any bleeding or bruising.  He has also continued on atorvastatin without side effects of myalgias.  He continues to smoke at this time and states Chantix did not provide him with benefit.  At this time, patient has no intentions on quitting.  No  further concerns at this time.  Denies new or worsening stroke/TIA symptoms.     ROS:   14 system review of systems is positive for aching muscles, muscle cramps with walking difficulty and all other systems negative  PMH:  Past Medical History:  Diagnosis Date  . Stroke [facility])     Social History:  Social History   Socioeconomic History  . Marital status: Married    Spouse name: Not on file  . Number of children: Not on file  . Years of education: Not on file  . Highest education level: Not on file  Occupational History  . Not on file  Social Needs  . Financial resource strain: Not on file  . Food insecurity:    Worry: Not on file    Inability: Not on file  . Transportation needs:    Medical: Not on file    Non-medical: Not on file  Tobacco Use  . Smoking status: Current Every Day Smoker    Packs/day: 1.00    Years: 40.00    Pack years: 40.00    Types: Cigarettes  . Smokeless tobacco: Former Neurosurgeon  . Tobacco comment: pack a day  Substance and Sexual Activity  . Alcohol use: No    Frequency: Never  . Drug use: No  .  Sexual activity: Yes  Lifestyle  . Physical activity:    Days per week: Not on file    Minutes per session: Not on file  . Stress: Not on file  Relationships  . Social connections:    Talks on phone: Not on file    Gets together: Not on file    Attends religious service: Not on file    Active member of club or organization: Not on file    Attends meetings of clubs or organizations: Not on file    Relationship status: Not on file  . Intimate partner violence:    Fear of current or ex partner: Not on file    Emotionally abused: Not on file    Physically abused: Not on file    Forced sexual activity: Not on file  Other Topics Concern  . Not on file  Social History Narrative  . Not on file    Medications:   Current Outpatient Medications on File Prior to Visit  Medication Sig Dispense Refill  . aspirin 325 MG tablet Take 1 tablet (325 mg  total) by mouth daily with breakfast. 30 tablet 1   No current facility-administered medications on file prior to visit.     Allergies:  No Known Allergies  Physical Exam General: well developed, well nourished middle-age Caucasian male, seated, in no evident distress Head: head normocephalic and atraumatic.  Neck: supple with no carotid or supraclavicular bruits Cardiovascular: regular rate and rhythm, no murmurs Musculoskeletal: no deformity Skin:  no rash/petichiae Vascular:  Normal pulses all extremities Vitals:   08/15/18 1544  BP: 110/63  Pulse: 62   Neurologic Exam Mental Status: Awake and fully alert. Oriented to place and time. Recent and remote memory intact. Attention span, concentration and fund of knowledge appropriate. Mood and affect appropriate.  Cranial Nerves: Fundoscopic exam reveals sharp disc margins. Pupils equal, briskly reactive to light. Extraocular movements full without nystagmus. Visual fields full to confrontation. Hearing intact. Facial sensation intact. Face, tongue, palate moves normally and symmetrically.  Motor: Normal bulk and tone. Normal strength in all tested extremity muscles. Diminished fine finger movements on the left. Mild left grip weakness. Orbits right over left upper extremity. Sensory.: intact to touch ,pinprick .position and vibratory sensation.  Coordination: Rapid alternating movements normal in all extremities. Finger-to-nose and heel-to-shin performed accurately bilaterally. Gait and Station: Arises from chair without difficulty. Stance is normal. Gait demonstrates normal stride length and balance . Able to heel, toe and tandem walk without difficulty.  Reflexes: 1+ and symmetric. Toes downgoing.    ASSESSMENT:  62 year old Caucasian male with Right MCA infarct due to right MCA occlusion due to artert to artery embolism from proximal right ICA stenosis treated with mechanical embolectomy  with complete recanalization of right MCA  and with rescue right ICA stenting and excellent clinical outcome. Multiple vascular risk factors of carotid stenosis, smoking, hypertension, hyperlipidemia.  Returns today for follow-up visit and overall doing well from a stroke standpoint but does have continued complaints of intermittent claudication pain.   PLAN: -Continue aspirin 325 and Lipitor for secondary stroke prevention -Advised to stop Plavix at this time as it has been greater than 66-month DAPT -f/u with Dr. Corliss Skains in December for repeat imaging post stent -Long discussion with patient and wife regarding risk of continuation of smoking and highly encouraged smoking cessation -Monitor BP at home -Increase activity level as tolerated and maintain a healthy diet -Maintain strict control of hypertension with blood pressure goal below 130/90, diabetes  with hemoglobin A1c goal below 6.5% and cholesterol with LDL cholesterol (bad cholesterol) goal below 70 mg/dL. I also advised the patient to eat a healthy diet with plenty of whole grains, cereals, fruits and vegetables, exercise regularly and maintain ideal body weight.  Followup in the future with me in 6 months or call earlier if needed  Greater than 50% time during this 25 minute consultation visit was spent on counseling and coordination of care about HLD, HTN and DM (risk factors), discussion about risk benefit of anticoagulation and answering questions.   George Hugh, AGNP-BC  [facility] [address] [place], [place] [address]  Phone [phone] Fax [phone]

## 2018-08-16 NOTE — Progress Notes (Signed)
I agree with the above plan 

## 2018-09-04 ENCOUNTER — Ambulatory Visit (INDEPENDENT_AMBULATORY_CARE_PROVIDER_SITE_OTHER): Payer: 59 | Admitting: Pharmacist

## 2018-09-04 DIAGNOSIS — B182 Chronic viral hepatitis C: Secondary | ICD-10-CM | POA: Diagnosis not present

## 2018-09-04 NOTE — Progress Notes (Signed)
HPI: Raymond Lowery is a 62 y.o. male who presents to the [facility] pharmacy clinic for Hep C follow-up.  He is here for his cure visit.  Medication: Mavyret x 8 weeks  Start Date: finished mid-August  Hepatitis C Genotype: 1a  Fibrosis Score: F2/F3  Hepatitis C RNA: <15 on 04/25/18 and 06/04/18  Patient Active Problem List   Diagnosis Date Noted  . Peripheral artery disease (HCC) 03/11/2018  . Claudication in peripheral vascular disease (HCC) 02/20/2018  . Tobacco abuse 02/20/2018  . Liver fibrosis 02/20/2018  . Chronic hepatitis C without hepatic coma (HCC) 01/22/2018  . Stroke (cerebrum) (HCC) 10/09/2017    Patient's Medications  New Prescriptions   No medications on file  Previous Medications   ASPIRIN 325 MG TABLET    Take 1 tablet (325 mg total) by mouth daily with breakfast.   ATORVASTATIN (LIPITOR) 40 MG TABLET    Take 1 tablet (40 mg total) by mouth daily at 6 PM.  Modified Medications   No medications on file  Discontinued Medications   No medications on file    Allergies: No Known Allergies  Past Medical History: Past Medical History:  Diagnosis Date  . Stroke [facility])     Social History: Social History   Socioeconomic History  . Marital status: Married    Spouse name: Not on file  . Number of children: Not on file  . Years of education: Not on file  . Highest education level: Not on file  Occupational History  . Not on file  Social Needs  . Financial resource strain: Not on file  . Food insecurity:    Worry: Not on file    Inability: Not on file  . Transportation needs:    Medical: Not on file    Non-medical: Not on file  Tobacco Use  . Smoking status: Current Every Day Smoker    Packs/day: 1.00    Years: 40.00    Pack years: 40.00    Types: Cigarettes  . Smokeless tobacco: Former Neurosurgeon  . Tobacco comment: pack a day  Substance and Sexual Activity  . Alcohol use: No    Frequency: Never  . Drug use: No  . Sexual activity: Yes  Lifestyle   . Physical activity:    Days per week: Not on file    Minutes per session: Not on file  . Stress: Not on file  Relationships  . Social connections:    Talks on phone: Not on file    Gets together: Not on file    Attends religious service: Not on file    Active member of club or organization: Not on file    Attends meetings of clubs or organizations: Not on file    Relationship status: Not on file  Other Topics Concern  . Not on file  Social History Narrative  . Not on file    Labs: Hepatitis C Lab Results  Component Value Date   HCVRNAPCRQN <15 NOT DETECTED 06/04/2018   HCVRNAPCRQN <15 NOT DETECTED 04/25/2018   FIBROSTAGE F2 01/22/2018   Hepatitis B Lab Results  Component Value Date   HEPBSAB NON-REACTIVE 01/22/2018   HEPBCAB NON-REACTIVE 01/22/2018   Hepatitis A Lab Results  Component Value Date   HAV NON-REACTIVE 01/22/2018   HIV Lab Results  Component Value Date   HIV Non Reactive 10/10/2017   Lab Results  Component Value Date   CREATININE 0.85 04/25/2018   CREATININE 0.75 01/22/2018   CREATININE 0.77 11/24/2017  CREATININE 0.60 (L) 10/11/2017   CREATININE 0.69 10/10/2017   Lab Results  Component Value Date   AST 12 04/25/2018   AST 32 01/22/2018   AST 25 10/09/2017   ALT 9 04/25/2018   ALT 41 01/22/2018   ALT 39 01/22/2018   INR 1.0 01/22/2018   INR 0.95 10/09/2017    Assessment: Raymond Lowery is here with his wife to follow-up for his chronic Hepatitis C infection.  He completed 8 weeks of Mavyret back in mid-August with no issues - no missed doses and no side effects. His last two Hep C viral loads were both undetectable.  Discussed the possibly of reinfection if he participates in risky behaviors.  Also counseled that his Hepatitis C antibody will always be positive and it is the viral load we look at to determine active infection.  I told him to call me if he ever needed me in the future. Will call with results.   Plan: - Hep C viral load today for  cure - Call with results   L. , PharmD, AAHIVP, CPP Infectious Diseases Clinical Pharmacist [facility] 09/04/2018, 3:55 PM

## 2018-09-07 ENCOUNTER — Telehealth: Payer: Self-pay | Admitting: Pharmacist

## 2018-09-07 LAB — HEPATITIS C RNA QUANTITATIVE
HCV Quantitative Log: <1.18 {Log_IU}/mL
HCV RNA, PCR, QN: <15 [IU]/mL

## 2018-09-07 NOTE — Telephone Encounter (Signed)
Called Raymond Lowery to let him know that his SVR12 Hep C viral load was undetectable indicating that he is cured of his Hepatitis C. Him and his wife were really excited.  Told them to call me in the future if they need anything.

## 2018-09-10 ENCOUNTER — Telehealth (HOSPITAL_COMMUNITY): Payer: Self-pay | Admitting: Radiology

## 2018-09-10 NOTE — Telephone Encounter (Signed)
Called pt, left a detailed VM that he should continue taking the Plavix 75mg  and the Aspirin 325mg  both 1 daily until his follow-up carotid ultrasound in December 2019 per Deveshwar. JM

## 2018-10-04 ENCOUNTER — Other Ambulatory Visit (HOSPITAL_COMMUNITY): Payer: Self-pay | Admitting: Interventional Radiology

## 2018-10-04 ENCOUNTER — Encounter: Payer: Self-pay | Admitting: Cardiovascular Disease

## 2018-10-04 ENCOUNTER — Ambulatory Visit: Payer: 59 | Admitting: Cardiovascular Disease

## 2018-10-04 VITALS — BP 134/70 | HR 61 | Ht 71.0 in | Wt 173.0 lb

## 2018-10-04 DIAGNOSIS — Z720 Tobacco use: Secondary | ICD-10-CM

## 2018-10-04 DIAGNOSIS — E785 Hyperlipidemia, unspecified: Secondary | ICD-10-CM

## 2018-10-04 DIAGNOSIS — I739 Peripheral vascular disease, unspecified: Secondary | ICD-10-CM

## 2018-10-04 DIAGNOSIS — I771 Stricture of artery: Secondary | ICD-10-CM

## 2018-10-04 NOTE — Patient Instructions (Addendum)
Medication Instructions:  No changes If you need a refill on your cardiac medications before your next appointment, please call your pharmacy.   Lab work: None ordered  Testing/Procedures: Your physician has requested that you have a lower extremity arterial duplex. During this test, ultrasound is used to evaluate arterial blood flow in the legs. Allow one hour for this exam. There are no restrictions or special instructions. This will take place at [address] [address], the [facility] office.  Your physician has requested that you have an ankle brachial index (ABI). During this test an ultrasound and blood pressure cuff are used to evaluate the arteries that supply the arms and legs with blood. Allow thirty minutes for this exam. There are no restrictions or special instructions. This will take place at [address], the [facility]  office.    Follow-Up: At [facility], you and your health needs are our priority.  As part of our continuing mission to provide you with exceptional heart care, we have created designated Provider Care Teams.  These Care Teams include your primary Cardiologist (physician) and Advanced Practice Providers (APPs -  Physician Assistants and Nurse Practitioners) who all work together to provide you with the care you need, when you need it. You will need a follow up appointment in 3 months. You may see Dr. Kirke CorinArida or one of the following Advanced Practice Providers on your designated Care Team:   Nicolasa Duckinghristopher Berge, NP Eula Listenyan Dunn, PA-C . Marisue IvanJacquelyn Visser, PA-C
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
## 2018-10-11 ENCOUNTER — Ambulatory Visit (HOSPITAL_COMMUNITY)
Admission: RE | Admit: 2018-10-11 | Discharge: 2018-10-11 | Disposition: A | Discharge status: Home / Self Care | Payer: 59 | Source: Ambulatory Visit | Attending: Interventional Radiology | Admitting: Interventional Radiology

## 2018-10-11 DIAGNOSIS — I771 Stricture of artery: Secondary | ICD-10-CM | POA: Insufficient documentation

## 2018-10-11 NOTE — Progress Notes (Signed)
Carotid duplex       has been completed. Preliminary results can be found under CV proc through chart review. Constantinos Krempasky Eunice, RDMS, RVT   

## 2018-10-15 ENCOUNTER — Telehealth (HOSPITAL_COMMUNITY): Payer: Self-pay

## 2018-10-15 NOTE — Telephone Encounter (Signed)
Called regarding recent us carotid, no answer, left vm. AW 

## 2018-10-15 NOTE — Telephone Encounter (Signed)
Pt agreed to f/u in 6 months with us carotid. AW 

## 2018-11-02 ENCOUNTER — Ambulatory Visit (INDEPENDENT_AMBULATORY_CARE_PROVIDER_SITE_OTHER): Payer: 59

## 2018-11-02 DIAGNOSIS — I739 Peripheral vascular disease, unspecified: Secondary | ICD-10-CM

## 2018-11-07 ENCOUNTER — Telehealth: Payer: Self-pay | Admitting: *Deleted

## 2018-11-07 NOTE — Telephone Encounter (Signed)
-----   Message from Iran Ouch, MD sent at 11/05/2018  5:07 PM EST ----- Moderately reduced ABI on the right with occluded distal SFA which is the likely cause of his right leg pain.  If his symptoms do not improve, we should consider proceeding with an angiogram.  He is scheduled to see me in March to discuss this but I can see him earlier if desired.

## 2018-11-07 NOTE — Telephone Encounter (Signed)
Left a message for the patient to call back.  

## 2018-11-08 NOTE — Telephone Encounter (Signed)
Left a message for the patient to call back.  

## 2018-11-09 ENCOUNTER — Telehealth: Payer: Self-pay | Admitting: Cardiovascular Disease

## 2018-11-09 NOTE — Telephone Encounter (Signed)
Wife has been made aware of results and verbalized her understanding (per DPR) She has also been informed that the patient has been made aware of the results earlier today.

## 2018-11-09 NOTE — Telephone Encounter (Signed)
New Message         Patient is calling back checking status of his results, pls call and advise.[phone]

## 2018-11-09 NOTE — Telephone Encounter (Signed)
Patient made aware of results and verbalized understanding. He will keep his appointment in March for now. If anything changes then he will call back

## 2019-01-08 ENCOUNTER — Encounter: Payer: Self-pay | Admitting: Cardiovascular Disease

## 2019-01-08 ENCOUNTER — Ambulatory Visit: Payer: 59 | Admitting: Cardiovascular Disease

## 2019-01-08 VITALS — BP 138/76 | HR 64 | Ht 71.0 in | Wt 170.0 lb

## 2019-01-08 DIAGNOSIS — Z720 Tobacco use: Secondary | ICD-10-CM | POA: Diagnosis not present

## 2019-01-08 DIAGNOSIS — I739 Peripheral vascular disease, unspecified: Secondary | ICD-10-CM | POA: Diagnosis not present

## 2019-01-08 DIAGNOSIS — E785 Hyperlipidemia, unspecified: Secondary | ICD-10-CM

## 2019-01-08 NOTE — Progress Notes (Signed)
Cardiology Office Note   Date:  01/08/2019   ID:  Raymond Lowery, DOB 07/18/1956, MRN [id number]  PCP:  [name], PA-C  Cardiologist:  [name], MD   Chief Complaint  Patient presents with  . other    3 mo follow up.Denies CP SOB.  Medications reviewed verbally with the patient.      History of Present Illness: Raymond Lowery is a 63 y.o. male who is here today for follow-up visit regarding  peripheral arterial disease.   He has chronic medical conditions that include hypertension, hyperlipidemia and tobacco use. The patient was hospitalized in December 2018 with right MCA stroke due to right MCA occlusion from suspected embolism from proximal right ICA with stenosis.  He was treated successfully with mechanical embolectomy and rescue ICA stenting. Echocardiogram showed normal LV systolic function with no significant valvular abnormalities.  TEE showed PFO with right-to-left shunt with Valsalva.  His PFO was not felt to be the source of his stroke.  He used to be a heavy alcohol drinker before his stroke but quit after the stroke. He has no family history of coronary artery disease.  The patient is doing well today. He is accompanied by his wife today. He still experiences pain in his right calf  but his left leg is fine. The pain starts after walking 100 feet, and rest resolves it. He is trying to quit smoking using patches and is determined to quit. 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 He has to wear steel-toed boots which exacerbates the pain, but he walks through it. Sometimes while driving he feels numbness in his right leg. Wife endorses that he is still having leg cramps at night. He denies chest pain, SOB, palpitations or any other related symptoms or complaints at this time.   Recent noninvasive vascular evaluation showed near normal ABI on the left and moderately decreased on the right with evidence of occluded right distal  SFA.  Past Medical History:  Diagnosis Date  . Hyperlipidemia   . Stroke [facility])     Past Surgical History:  Procedure Laterality Date  . BACK SURGERY    . IR ANGIO EXTRACRAN SEL COM CAROTID INNOMINATE UNI L MOD SED  10/09/2017  . IR ANGIO VERTEBRAL SEL SUBCLAVIAN INNOMINATE UNI R MOD SED  10/09/2017  . IR INTRAVSC STENT CERV CAROTID W/O EMB-PROT MOD SED INC ANGIO  10/09/2017  . IR PERCUTANEOUS ART THROMBECTOMY/INFUSION INTRACRANIAL INC DIAG ANGIO  10/09/2017  . IR RADIOLOGIST EVAL & MGMT  11/06/2017  . JOINT REPLACEMENT    . RADIOLOGY WITH ANESTHESIA N/A 10/09/2017   Procedure: RADIOLOGY WITH ANESTHESIA;  Surgeon: Radiologist, Medication, MD;  Location: [facility] OR;  Service: Radiology;  Laterality: N/A;  . TEE WITHOUT CARDIOVERSION N/A 01/26/2018   Procedure: TRANSESOPHAGEAL ECHOCARDIOGRAM (TEE);  Surgeon: Yates Decamp, MD;  Location: [facility] ENDOSCOPY;  Service: Cardiovascular;  Laterality: N/A;     Current Outpatient Medications  Medication Sig Dispense Refill  . aspirin 325 MG tablet Take 1 tablet (325 mg total) by mouth daily with breakfast. 30 tablet 1  . atorvastatin (LIPITOR) 40 MG tablet Take 1 tablet (40 mg total) by mouth daily at 6 PM. 90 tablet 3  . clopidogrel (PLAVIX) 75 MG tablet Take 75 mg by mouth daily with breakfast.  1   No current facility-administered medications for this visit.     Allergies:   Patient has no known allergies.    Social History:  The patient  reports that he has been smoking cigarettes. He has a 40.00 pack-year smoking history. He has quit using smokeless tobacco. He reports that he does not drink alcohol or use drugs.   Family History:  The patient's family history includes COPD in his mother; Dementia in his father.    ROS:  Please see the history of present illness.   Otherwise, review of systems are positive for none.   All other systems are reviewed and negative.    PHYSICAL EXAM: VS:  BP 138/76 (BP Location: Left Arm, Patient Position:  Sitting, Cuff Size: Normal)   Pulse 64   Ht 5\' 11"  (1.803 m)   Wt 170 lb (77.1 kg)   BMI 23.71 kg/m  , BMI Body mass index is 23.71 kg/m. GEN: Well nourished, well developed, in no acute distress  HEENT: normal  Neck: no JVD, carotid bruits, or masses Cardiac: RRR; no murmurs, rubs, or gallops,no edema Respiratory:  clear to auscultation bilaterally, normal work of breathing GI: soft, nontender, nondistended, + BS MS: no deformity or atrophy  Skin: warm and dry, no rash Neuro:  Strength and sensation are intact Psych: euthymic mood, full affect Vascular:  Radial pulses +2 on the right and +1 on the left.  Femoral pulses normal bilaterally.  Distal pulses are not palpable.   EKG:  EKG is ordered today. The ekg ordered today demonstrates normal sinus rhythm with no significant ST or T wave changes.   Recent Labs: 01/22/2018: Hemoglobin 12.4; Platelets 237 04/25/2018: ALT 9; BUN 12; Creat 0.85; Potassium 4.1; Sodium 138    Lipid Panel    Component Value Date/Time   CHOL 92 04/25/2018 1550   TRIG 79 04/25/2018 1550   HDL 33 (L) 04/25/2018 1550   CHOLHDL 2.8 04/25/2018 1550   VLDL 25 10/10/2017 0337   LDLCALC 43 04/25/2018 1550      Wt Readings from Last 3 Encounters:  01/08/19 170 lb (77.1 kg)  10/04/18 173 lb (78.5 kg)  08/15/18 170 lb 12.8 oz (77.5 kg)      PAD Screen 10/04/2018  Previous PAD dx? Yes  Previous surgical procedure? No  Pain with walking? Yes  Subsides with rest? Yes  Feet/toe relief with dangling? No  Painful, non-healing ulcers? No  Extremities discolored? No      ASSESSMENT AND PLAN:  1.  Peripheral arterial disease with severe lifestyle limiting claudication on the right side: Rutherford class III.   ABI was moderately reduced on the right with evidence of occluded distal SFA which is the culprit for his symptoms.  I again discussed with him the option of proceeding with angiography and possible endovascular intervention but the patient does  not feel limited enough at the present time and wants to continue with medical therapy. I discussed with him the importance of starting a walking exercise program.  2.  Tobacco use: I discussed with him the importance of smoking cessation.  3.  Hyperlipidemia: Continue treatment with atorvastatin with a target LDL of less than 70.    Disposition:   FU with me in 6 months  I, Jesus Reyes am acting as a Neurosurgeon for [name], M.D.  I have reviewed the above documentation for accuracy and completeness, and I agree with the above.    Signed, [name], MD 01/08/19 [facility] [place], [place] [phone]

## 2019-01-08 NOTE — Patient Instructions (Signed)
Medication Instructions:  Dr.Arida recommends that you continue on your current medications as directed. Please refer to the Current Medication list given to you today.  If you need a refill on your cardiac medications before your next appointment, please call your pharmacy.   Lab work: None ordered   Testing/Procedures: None ordered  Follow-Up: At [facility], you and your health needs are our priority.  As part of our continuing mission to provide you with exceptional heart care, we have created designated Provider Care Teams.  These Care Teams include your primary Cardiologist (physician) and Advanced Practice Providers (APPs -  Physician Assistants and Nurse Practitioners) who all work together to provide you with the care you need, when you need it. You will need a follow up appointment in 6 months.  Please call our office 2 months in advance to schedule this appointment.  You may see Dr.Arida or one of the following Advanced Practice Providers on your designated Care Team:   Christopher Berge, NP Ryan Dunn, PA-C . Jacquelyn Visser, PA-C     

## 2019-02-06 ENCOUNTER — Telehealth: Payer: Self-pay

## 2019-02-06 NOTE — Telephone Encounter (Signed)
I spoke with pt and wife about visit will be change to video virtual visit due to COVID 19. Also we are not doing in office visits at this time. I gave webex to download on his computer. I verify his email. Updated med list and pharmacy.

## 2019-02-14 ENCOUNTER — Other Ambulatory Visit: Payer: Self-pay

## 2019-02-14 ENCOUNTER — Ambulatory Visit: Payer: 59 | Admitting: Adult Health

## 2019-02-14 NOTE — Progress Notes (Deleted)
[facility] [address] [place]. [place] [address] [phone]       OFFICE FOLLOW-UP NOTE  Raymond Lowery Date of Birth:  November 24, 1955 Medical Record Number:  [id number]   HPI: Raymond Lowery is a 63 year old Caucasian male seen today for first office follow-up visit for in-hospital admission for stroke in December 2018. He is accompanied by his wife. History is obtained from them as well as review of electronic medical records. I have personally reviewed imaging films.Raymond Lowery a 63 y.o.malewith a history of tobacco abuse who presents with left sided weakness. He had transient symptoms this morning and subsequently improved. He actually worked for most of the day, but then hais wife saw him reaching with his right arm because he could not use his left arm well.She therefore called 911. She states that his left side has seemed to get somewhat better in the interim, but he still has significant deficits. OZH:[id number] given?: no,outside of window.Patient was felt to have clinical signs of large vessel occlusion and hence underwent Emergent large vessel occlusion of the right middle cerebral artery at its origin, in addition to long segment occlusion/critical stenosis of the right internal carotid artery beginning at the carotid bifurcation with a CT angiographic string sign extending to the skullbase, where the artery is completely occluded CT perfusion which showedlarge area of penumbra with right M1 occlusion,suspectedsecondary to carotid disease emergent cerebral catheter angiogram that showed right M1 occlusion and after written consent patient underwent emergent revascularization with the mechanical embolectomy with complete revascularization of the right middle cerebral artery with solitaire retrieval device and right ICA stent assisted angioplasty achieving.TICI 2b reperfusion . He was admitted to the intensive care unit where blood pressure was tightly  controlled. He was extubated and did well. MRI scan of the brain showed a right MCA infarct involving basal ganglia, right insular and frontal operculum cortex. Transthoracic echo showed normal ejection fraction. LDL cholesterol was 69 mg percent and hemoglobin A1c was 5.3. Patient was counseled to quit smoking and started on aspirin and Plavix given his recent carotid stent He was discharged home and states his done well. He has cut back smoking significantly but still smokes 4-6 cigarettes per day but plans to quit soon. His blood pressure is well controlled today is 120/68. Is tolerating aspirin and Plavix without significant bleeding or bruising. He is tolerating Lipitor well without muscle aches and pains. His began practically full strength on the left side except some diminished fine motor skills in the left hand. He wants to return to work and driving.   Update 02/03/18: Patient returns today for follow-up visit and is accompanied by his wife.  Continues to take aspirin and Plavix with mild bruising but no bleeding.  Continues to take Lipitor without side effects of myalgias.  Overall patient is doing well currently he stroke standpoint.  He does have complaints of left leg jerking and left arm jerking that has been occurring for the past couple months but only occurs a few times as soon as he wakes up and he is able to control it can stop the jerking.  Also have complaints of intermittent leg cramps only while ambulating and resolves immediately with rest.  From symptom description, this sounds more likely intermittent claudication and statin myalgia advised him to speak to cardiologist.  Recent TEE on 01/26/18 which did show PFO.  Wife and patient also inquiring about possible need for loop recorder and interested in doing this before PFO surgery.  He has an appointment with Dr. Jacinto HalimGanji on 02/15/2018 and he will speak to him regarding these concerns.  RoPE score 3 points with a 0% chance stroke is related to  PFO and a 20% risk of recurrence within 2 years for stroke/TIA.  He states he has been attempting to quit smoking where he smokes approximately half a pack per day but also use chewing tobacco.  He does not want to use the Wellbutrin that was prescribed to him as he has tried this before and it did not work.  Recommended use of Chantix which patient has never tried and willing to try this for smoking cessation.  Patient continues to stay active and try to eat a healthy diet. blood pressure at today's visit satisfactory 123/58.  Denies new or worsening stroke/TIA symptoms.  ROS:   14 system review of systems is positive for fatigue, headache, bruise easily and all other systems negative  PMH:  Past Medical History:  Diagnosis Date   Hyperlipidemia    Stroke [facility](HCC)     Social History:  Social History   Socioeconomic History   Marital status: Married    Spouse name: Not on file   Number of children: Not on file   Years of education: Not on file   Highest education level: Not on file  Occupational History   Not on file  Social Needs   Financial resource strain: Not on file   Food insecurity:    Worry: Not on file    Inability: Not on file   Transportation needs:    Medical: Not on file    Non-medical: Not on file  Tobacco Use   Smoking status: Current Every Day Smoker    Packs/day: 1.00    Years: 40.00    Pack years: 40.00    Types: Cigarettes   Smokeless tobacco: Former NeurosurgeonUser   Tobacco comment: pack a day  Substance and Sexual Activity   Alcohol use: No    Frequency: Never   Drug use: No   Sexual activity: Yes  Lifestyle   Physical activity:    Days per week: Not on file    Minutes per session: Not on file   Stress: Not on file  Relationships   Social connections:    Talks on phone: Not on file    Gets together: Not on file    Attends religious service: Not on file    Active member of club or organization: Not on file    Attends meetings of clubs  or organizations: Not on file    Relationship status: Not on file   Intimate partner violence:    Fear of current or ex partner: Not on file    Emotionally abused: Not on file    Physically abused: Not on file    Forced sexual activity: Not on file  Other Topics Concern   Not on file  Social History Narrative   Not on file    Medications:   Current Outpatient Medications on File Prior to Visit  Medication Sig Dispense Refill   aspirin 325 MG tablet Take 1 tablet (325 mg total) by mouth daily with breakfast. 30 tablet 1   atorvastatin (LIPITOR) 40 MG tablet Take 1 tablet (40 mg total) by mouth daily at 6 PM. 90 tablet 3   clopidogrel (PLAVIX) 75 MG tablet Take 75 mg by mouth daily with breakfast.  1   No current facility-administered medications on file prior to visit.     Allergies:  No Known Allergies  Physical Exam General: well developed, well nourished middle-age Caucasian male, seated, in no evident distress Head: head normocephalic and atraumatic.  Neck: supple with no carotid or supraclavicular bruits Cardiovascular: regular rate and rhythm, no murmurs Musculoskeletal: no deformity Skin:  no rash/petichiae Vascular:  Normal pulses all extremities There were no vitals filed for this visit. Neurologic Exam Mental Status: Awake and fully alert. Oriented to place and time. Recent and remote memory intact. Attention span, concentration and fund of knowledge appropriate. Mood and affect appropriate.  Cranial Nerves: Fundoscopic exam reveals sharp disc margins. Pupils equal, briskly reactive to light. Extraocular movements full without nystagmus. Visual fields full to confrontation. Hearing intact. Facial sensation intact. Face, tongue, palate moves normally and symmetrically.  Motor: Normal bulk and tone. Normal strength in all tested extremity muscles. Diminished fine finger movements on the left. Mild left grip weakness. Orbits right over left upper extremity. Sensory.:  intact to touch ,pinprick .position and vibratory sensation.  Coordination: Rapid alternating movements normal in all extremities. Finger-to-nose and heel-to-shin performed accurately bilaterally. Gait and Station: Arises from chair without difficulty. Stance is normal. Gait demonstrates normal stride length and balance . Able to heel, toe and tandem walk without difficulty.  Reflexes: 1+ and symmetric. Toes downgoing.    ASSESSMENT:  64 year old Caucasian male with Right MCA infarct due to right MCA occlusion due to artert to artery embolism from proximal right ICA stenosis treated with mechanical embolectomy  with complete recanalization of right MCA and with rescue right ICA stenting and excellent clinical outcome. Multiple vascular risk factors of carotid stenosis, smoking, hypertension, hyperlipidemia.  Returns today for follow-up visit and overall doing well from a stroke standpoint.   PLAN: -Continue aspirin 325 and Plavix daily and Lipitor for secondary stroke prevention -Patient will continue aspirin and Plavix until 04/11/2018 (6 months post stent placement) and then will continue on aspirin only -f/u Dr. Jacinto Halim regarding PFO, loop recorder and possible intermittent claudication -Smoking cessation -prescribed Chantix starter pack -Monitor BP at home -Maintain strict control of hypertension with blood pressure goal below 130/90, diabetes with hemoglobin A1c goal below 6.5% and cholesterol with LDL cholesterol (bad cholesterol) goal below 70 mg/dL. I also advised the patient to eat a healthy diet with plenty of whole grains, cereals, fruits and vegetables, exercise regularly and maintain ideal body weight.  Followup in the future with me in 6 months or call earlier if needed  Greater than 50% time during this 25 minute consultation visit was spent on counseling and coordination of care about HLD, HTN and DM (risk factors), discussion about risk benefit of anticoagulation and answering  questions.   George Hugh, AGNP-BC  [facility] [address] [place], [place] [address]  Phone [phone] Fax [phone]

## 2019-02-18 ENCOUNTER — Other Ambulatory Visit: Payer: Self-pay

## 2019-02-18 ENCOUNTER — Encounter: Payer: Self-pay | Admitting: Adult Health

## 2019-02-18 ENCOUNTER — Ambulatory Visit (INDEPENDENT_AMBULATORY_CARE_PROVIDER_SITE_OTHER): Payer: 59 | Admitting: Adult Health

## 2019-02-18 DIAGNOSIS — I10 Essential (primary) hypertension: Secondary | ICD-10-CM

## 2019-02-18 DIAGNOSIS — I63231 Cerebral infarction due to unspecified occlusion or stenosis of right carotid arteries: Secondary | ICD-10-CM | POA: Diagnosis not present

## 2019-02-18 DIAGNOSIS — E785 Hyperlipidemia, unspecified: Secondary | ICD-10-CM

## 2019-02-18 DIAGNOSIS — Z95828 Presence of other vascular implants and grafts: Secondary | ICD-10-CM

## 2019-02-18 DIAGNOSIS — Z9889 Other specified postprocedural states: Secondary | ICD-10-CM

## 2019-02-18 DIAGNOSIS — I739 Peripheral vascular disease, unspecified: Secondary | ICD-10-CM | POA: Diagnosis not present

## 2019-02-18 NOTE — Progress Notes (Addendum)
[facility] [address] [place]. [place] [address] [phone]       VIRTUAL VISIT FOLLOW-UP NOTE  [name] Date of Birth:  [birth year] Medical Record Number:  [id number]   Virtual Visit via Video Note  I connected with Raymond Lowery on 02/18/19 at 10:45 AM EDT by a video enabled telemedicine application located remotely in my own home and verified that I am speaking with the correct person using two identifiers who was located at their own home.   I discussed the limitations of evaluation and management by telemedicine and the availability of in person appointments. The patient expressed understanding and agreed to proceed.     HPI:  Raymond Lowery is a 63 y.o. male with underlying medical history of tobacco use, PAD, HLD, asthma and right MCA infarct secondary to right MCA occlusion due to artery embolism from right ICA stenosis treated with mechanical embolectomy and rescue stenting in 09/2017.  He has been followed in this office since this time.  He was initially scheduled today for routine follow-up visit but due to COVID-19 safety precautions, visit transitioned to telemedicine via WebEx.  He was last evaluated on 08/15/18 with complaints of intermittent left upper and lower extremity jerking and intermittent leg cramps likely due to claudication.  He continues to be followed by cardiology Dr. Kirke Corin for PAD management and at this time patient elected to continue with medical therapy and hold off on any endovascular intervention.  Has been stable. He continues to smoke approx 1 pack per day which has been decreased from 2 packs prior while at work. He did undergo repeat carotid duplex on 10/11/2018 which was unremarkable and recommended repeat carotid duplex in 6 months time along with ongoing follow-up with vascular surgery.  He has continued on DAPT with aspirin and Plavix despite prior recommendations of discontinuing Plavix and continuing  aspirin only but denies side effects of bleeding or bruising.  He continues on atorvastatin without side effects myalgias.  Blood pressure stable.  Denies new or worsening stroke/TIA symptoms.      ROS:   14 system review of systems is positive for bruise easily and pain and all other systems negative  PMH:  Past Medical History:  Diagnosis Date   Hyperlipidemia    Stroke [facility])     Social History:  Social History   Socioeconomic History   Marital status: Married    Spouse name: Not on file   Number of children: Not on file   Years of education: Not on file   Highest education level: Not on file  Occupational History   Not on file  Social Needs   Financial resource strain: Not on file   Food insecurity:    Worry: Not on file    Inability: Not on file   Transportation needs:    Medical: Not on file    Non-medical: Not on file  Tobacco Use   Smoking status: Current Every Day Smoker    Packs/day: 1.00    Years: 40.00    Pack years: 40.00    Types: Cigarettes   Smokeless tobacco: Former Neurosurgeon   Tobacco comment: pack a day  Substance and Sexual Activity   Alcohol use: No    Frequency: Never   Drug use: No   Sexual activity: Yes  Lifestyle   Physical activity:    Days per week: Not on file    Minutes per session: Not on file   Stress: Not on  file  Relationships   Social connections:    Talks on phone: Not on file    Gets together: Not on file    Attends religious service: Not on file    Active member of club or organization: Not on file    Attends meetings of clubs or organizations: Not on file    Relationship status: Not on file   Intimate partner violence:    Fear of current or ex partner: Not on file    Emotionally abused: Not on file    Physically abused: Not on file    Forced sexual activity: Not on file  Other Topics Concern   Not on file  Social History Narrative   Not on file    Medications:   Current Outpatient Medications  on File Prior to Visit  Medication Sig Dispense Refill   aspirin 325 MG tablet Take 1 tablet (325 mg total) by mouth daily with breakfast. 30 tablet 1   atorvastatin (LIPITOR) 40 MG tablet Take 1 tablet (40 mg total) by mouth daily at 6 PM. 90 tablet 3   clopidogrel (PLAVIX) 75 MG tablet Take 75 mg by mouth daily with breakfast.  1   No current facility-administered medications on file prior to visit.     Allergies:  No Known Allergies    Physical Exam General: well developed, well nourished pleasant middle-aged Caucasian male, seated in no evident distress Head: head normocephalic and atraumatic.    Neurologic Exam Mental Status: Awake and fully alert. Oriented to place and time. Recent and remote memory intact. Attention span, concentration and fund of knowledge appropriate. Mood and affect appropriate.  Cranial Nerves: Extraocular movements full without nystagmus. Visual fields full to confrontation. Hearing intact to voice.  Face, tongue, palate moves normally and symmetrically.  Shoulder shrug symmetric. Motor: No evidence of large muscle weakness per drift assessment.  Mild decreased left hand finger dexterity. Sensory.:  UTA but denies temperature variation or numbness/tingling Coordination: Finger-to-nose and heel-to-shin performed accurately bilaterally. Gait and Station: Arises from chair without difficulty. Stance is normal. Gait demonstrates normal stride length and balance . Able to heel, toe and tandem walk without difficulty.  Reflexes: UTA   ASSESSMENT:  64 year old Caucasian male with Right MCA infarct due to right MCA occlusion due to artert to artery embolism from proximal right ICA stenosis treated with mechanical embolectomy  with complete recanalization of right MCA and with rescue right ICA stenting and excellent clinical outcome. Multiple vascular risk factors of carotid stenosis, smoking, hypertension, hyperlipidemia.  He continues to do well from a stroke  standpoint with residual deficit of mild decreased left hand dexterity but denies new or worsening stroke/TIA symptoms.   PLAN: -Continue aspirin 325 and atorvastatin for secondary stroke prevention -From a stroke standpoint, recommend to discontinue Plavix at this time but will follow-up with cardiology and vascular surgery in regards to this recommendation -Continue to follow with vascular surgery for ongoing surveillance right ICA monitoring -Continue to follow with cardiology for PAD management -Discussion regarding importance of smoking cessation for overall health, secondary stroke prevention and improvement of PAD especially if he undergoes endovascular intervention -Continue to monitor BP at home -Maintain strict control of hypertension with blood pressure goal below 130/90, diabetes with hemoglobin A1c goal below 6.5% and cholesterol with LDL cholesterol (bad cholesterol) goal below 70 mg/dL. I also advised the patient to eat a healthy diet with plenty of whole grains, cereals, fruits and vegetables, exercise regularly and maintain ideal body weight.  ADDENDUM:  Per vascular surgery recommendations, he will follow-up with repeat carotid ultrasound around 04/2019 and they will decide from there ongoing duration and need of DAPT   Stable from a stroke standpoint and recommend follow-up as needed or call office with any questions or concerns regarding stroke management  Greater than 50% time during this 25 minute consultation visit was spent on counseling and coordination of care about HLD, HTN and DM (risk factors), discussion about risk benefit of anticoagulation and answering questions.   George HughJessica Orel Hord, AGNP-BC  [facility] [address] [place], [place] [id number]  Phone [phone] Fax [phone]

## 2019-02-18 NOTE — Progress Notes (Signed)
I agree with the above plan 

## 2019-02-20 ENCOUNTER — Telehealth: Payer: Self-pay | Admitting: Adult Health

## 2019-02-20 NOTE — Telephone Encounter (Signed)
error 

## 2019-04-30 ENCOUNTER — Telehealth (HOSPITAL_COMMUNITY): Payer: Self-pay

## 2019-04-30 NOTE — Telephone Encounter (Signed)
Called to schedule f/u us carotid, no answer, left vm. AW 

## 2019-05-23 ENCOUNTER — Telehealth (HOSPITAL_COMMUNITY): Payer: Self-pay

## 2019-05-23 NOTE — Telephone Encounter (Signed)
Called to schedule f/u us carotid, no answer, left vm. AW 

## 2019-06-30 IMAGING — US US LIVER ELASTOGRAPHY
1 series · 13 of 13 positions shown · non-contrast
Comparison: RIGHT upper quadrant ultrasound 12/04/2017

CLINICAL DATA: Chronic hepatitis-C without hepatic coma

EXAM:
US LIVER ELASTOGRAPHY
TECHNIQUE: Ultrasound elastography evaluation of the liver was performed. A
region of interest was placed in the right lobe of the liver.
Following application of a compressive sonographic pulse, shear
waves were detected in the adjacent hepatic tissue and the shear
wave velocity was calculated. Multiple assessments were performed at
the selected site. Median shear wave velocity is correlated to a
Metavir fibrosis score.

[Series 1: us liver elastography · 0.14mm/px · 13 of 13 slices shown]
[im 1/13]
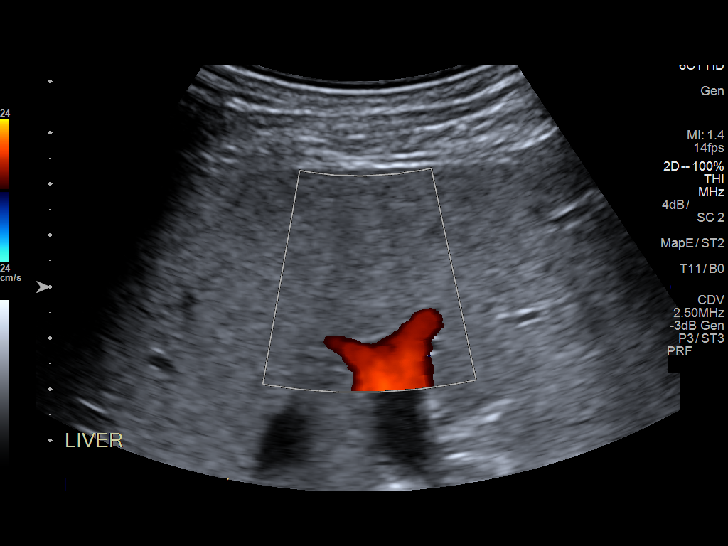
[im 2/13]
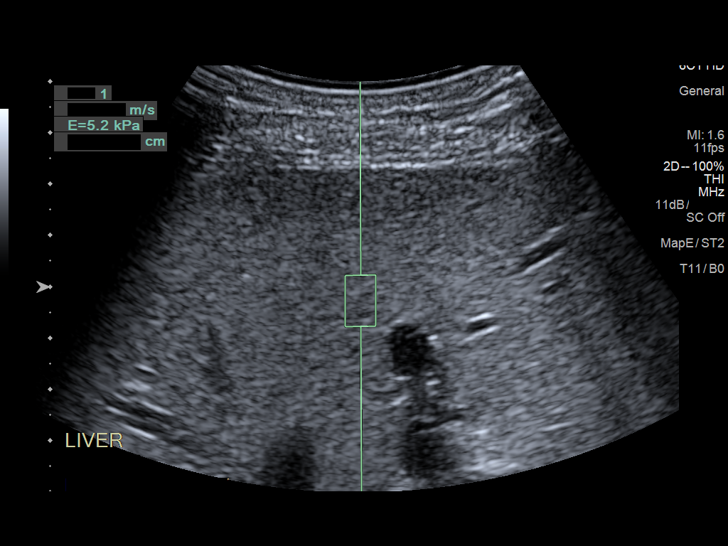
[im 3/13]
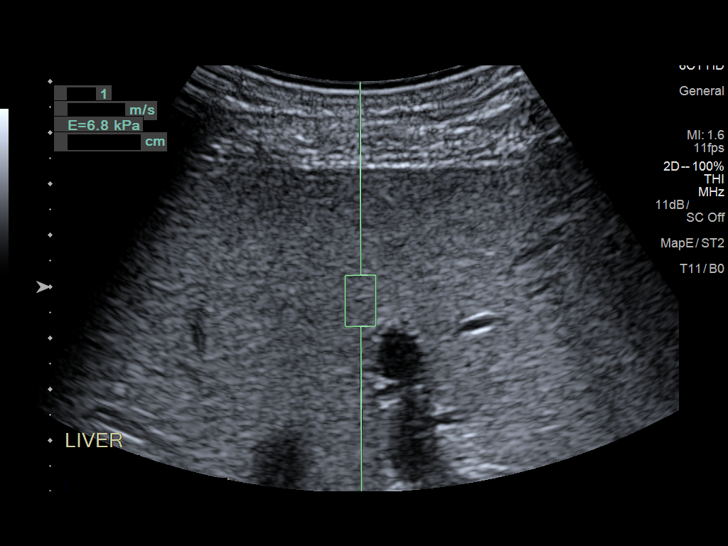
[im 4/13]
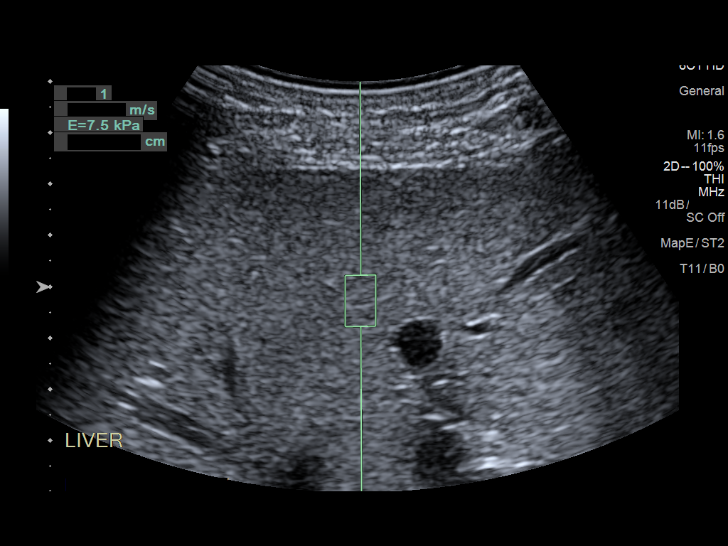
[im 5/13]
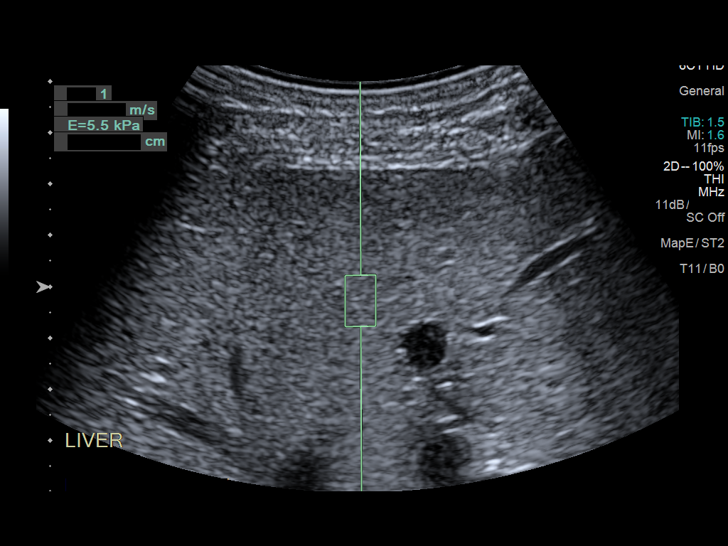
[im 6/13]
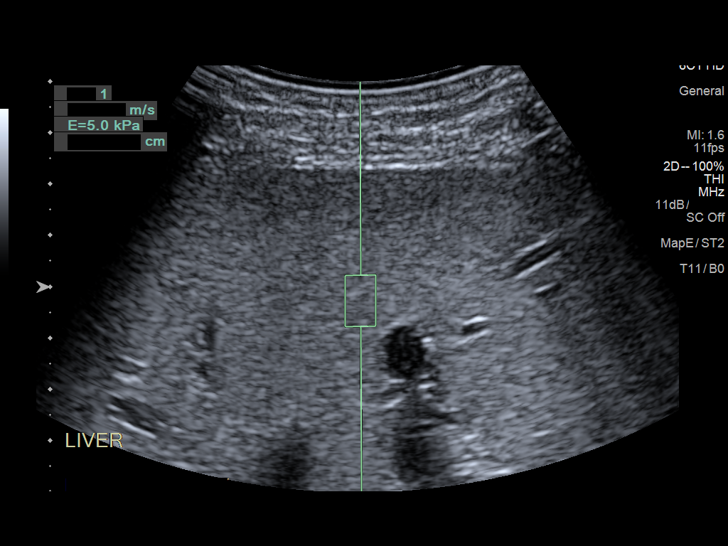
[im 7/13]
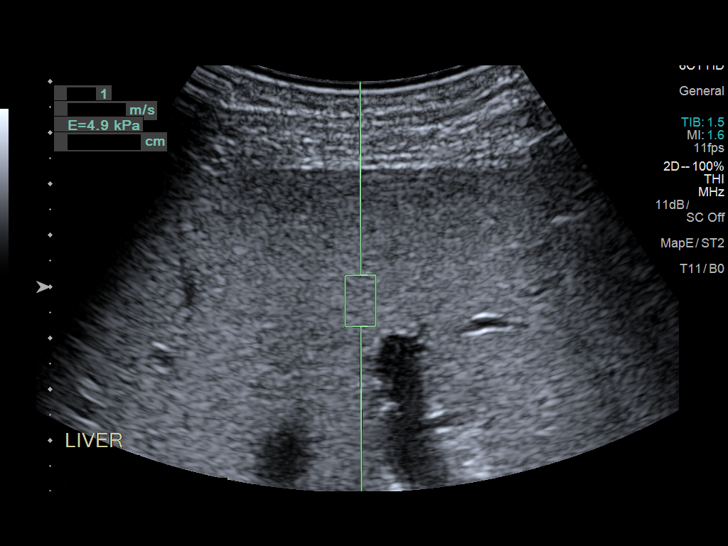
[im 8/13]
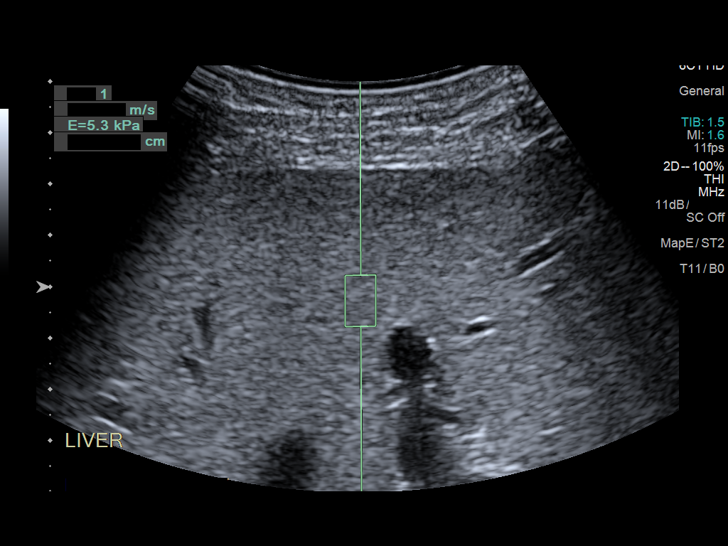
[im 9/13]
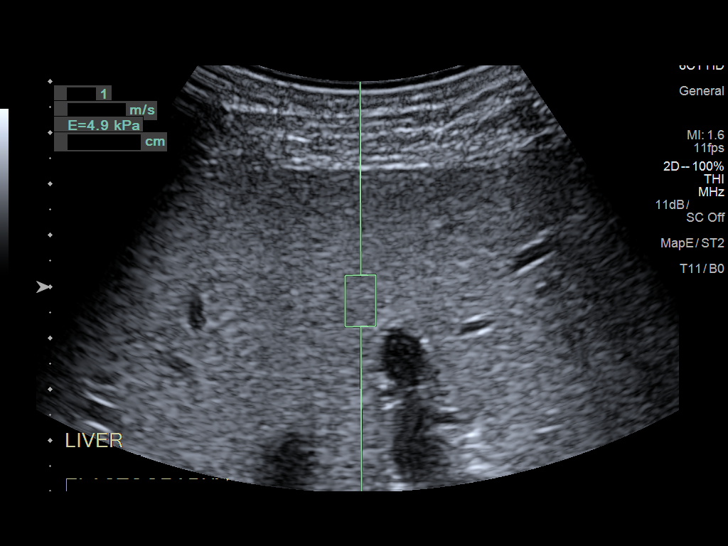
[im 10/13]
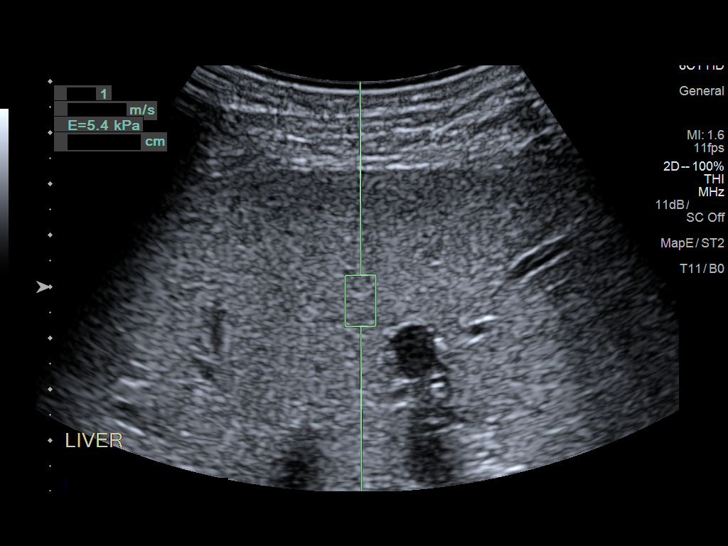
[im 11/13]
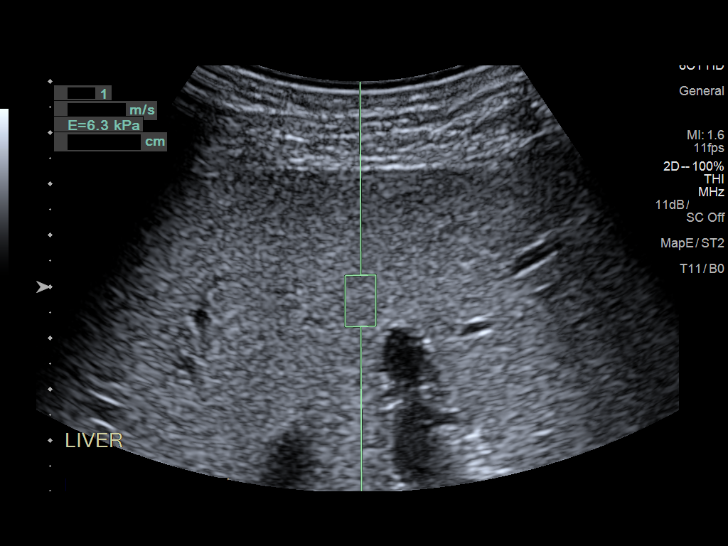
[im 12/13]
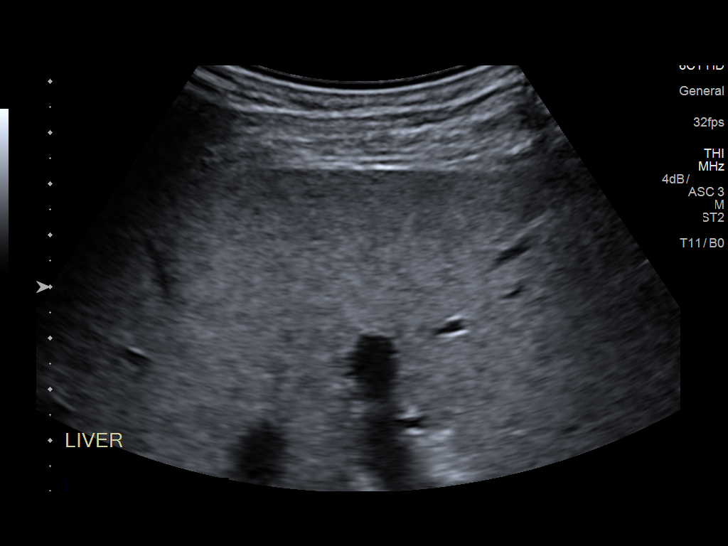
[im 13/13]
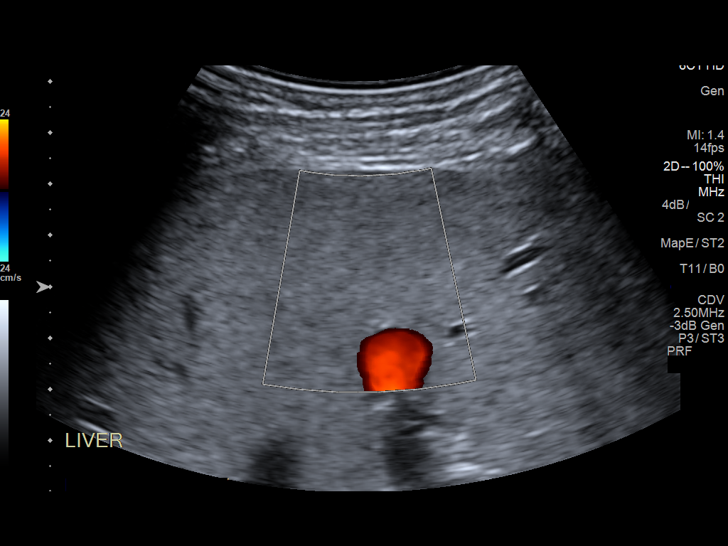

[13 of 13 positions shown; findings below may reference images not displayed]

FINDINGS: Liver: Echogenic parenchyma, question fatty infiltration though this
can be seen with cirrhosis and certain infiltrative disorders. No
focal hepatic mass or nodularity. On a single obtained image, an
echogenic tiny polyp versus focus of ring down artifact is seen at
the anterior gallbladder wall; this was not identified on the
previous exam. Portal vein is patent on color Doppler imaging with
normal direction of blood flow towards the liver.

ULTRASOUND HEPATIC ELASTOGRAPHY

Device: Siemens Helix VTQ

Patient position: Supine

Transducer [id number]

Number of measurements: 10

Hepatic segment:  8

Median velocity:   1.34 m/sec

IQR:

IQR/Median velocity ratio:

Corresponding Metavir fibrosis score:  F2 + some F3

Risk of fibrosis: Moderate

Limitations of exam: None

Please note that abnormal shear wave velocities may also be
identified in clinical settings other than with hepatic fibrosis,
such as: acute hepatitis, elevated right heart and central venous
pressures including use of beta blockers, Sarduy disease
([place]), infiltrative processes such as
mastocytosis/amyloidosis/infiltrative tumor, extrahepatic
cholestasis, in the post-prandial state, and liver transplantation.
Correlation with patient history, laboratory data, and clinical
condition recommended.
IMPRESSION: Liver: Echogenic hepatic parenchyma which could reflect fatty
infiltration though can also be seen with cirrhosis and certain
infiltrative disorders; no focal hepatic mass or nodularity
visualized.

Question tiny gallbladder polyp versus focus of cholesterolosis with
ring down artifact at the anterior gallbladder wall.

Elastography: Median hepatic shear wave velocity is calculated at
1.34 m/sec.

Corresponding Metavir fibrosis score is F2 + some F3.

Risk of fibrosis is Moderate.

Follow-up: Additional testing appropriate

## 2019-07-05 ENCOUNTER — Telehealth: Payer: Self-pay | Admitting: *Deleted

## 2019-07-05 NOTE — Telephone Encounter (Signed)
Left a message for the patient to call back concerning his appointment on 9/8 with Dr. Fletcher Anon. His appointment was made for the [facility] office when he normally is seen in [place].

## 2019-07-09 ENCOUNTER — Ambulatory Visit (INDEPENDENT_AMBULATORY_CARE_PROVIDER_SITE_OTHER): Payer: 59 | Admitting: Cardiovascular Disease

## 2019-07-09 ENCOUNTER — Other Ambulatory Visit: Payer: Self-pay

## 2019-07-09 ENCOUNTER — Encounter: Payer: Self-pay | Admitting: Cardiovascular Disease

## 2019-07-09 VITALS — BP 120/62 | HR 56 | Ht 71.0 in | Wt 164.0 lb

## 2019-07-09 DIAGNOSIS — E785 Hyperlipidemia, unspecified: Secondary | ICD-10-CM

## 2019-07-09 DIAGNOSIS — I739 Peripheral vascular disease, unspecified: Secondary | ICD-10-CM

## 2019-07-09 DIAGNOSIS — Z720 Tobacco use: Secondary | ICD-10-CM

## 2019-07-09 MED ORDER — ASPIRIN EC 81 MG PO TBEC: 81.0000 mg | DELAYED_RELEASE_TABLET | Freq: Every day | ORAL | 3 refills | Status: DC

## 2019-07-09 NOTE — Patient Instructions (Signed)
Medication Instructions:   -START TAKING ENTERIC COATED ASPIRIN 81 MG  - ONE TABLET DAILY   If you need a refill on your cardiac medications before your next appointment, please call your pharmacy.   Lab work:  NOT NEEDED  Testing/Procedures: NOT NEEDED Follow-Up: At [facility], you and your health needs are our priority.  As part of our continuing mission to provide you with exceptional heart care, we have created designated Provider Care Teams.  These Care Teams include your primary Cardiologist (physician) and Advanced Practice Providers (APPs -  Physician Assistants and Nurse Practitioners) who all work together to provide you with the care you need, when you need it. You will need a follow up appointment in 6 months.  Please call our office 2 months in advance to schedule this appointment.  You may see Kathlyn Sacramento, MD     Any Other Special Instructions Will Be Listed Below (If Applicable).

## 2019-07-09 NOTE — Progress Notes (Signed)
Cardiology Office Note   Date:  07/09/2019   ID:  Raymond Lowery, DOB 05/05/1956, MRN [id number]  PCP:  Blair Heys, PA-C  Cardiologist:  Kathlyn Sacramento, MD   Chief Complaint  Patient presents with  . Other'    Patient denies chest pain and SOB at this time. Meds reviewed verbally with patient.       History of Present Illness: Raymond Lowery is a 63 y.o. male who is here today for follow-up visit regarding  peripheral arterial disease.   He has chronic medical conditions that include hypertension, hyperlipidemia and tobacco use. The patient was hospitalized in December 2018 with right MCA stroke due to right MCA occlusion from suspected embolism from proximal right ICA with stenosis.  He was treated successfully with mechanical embolectomy and rescue ICA stenting. Echocardiogram showed normal LV systolic function with no significant valvular abnormalities.  TEE showed PFO with right-to-left shunt with Valsalva.  His PFO was not felt to be the source of his stroke.  He used to be a heavy alcohol drinker before his stroke but quit after the stroke. He has no family history of coronary artery disease.  He is being followed for peripheral arterial disease with known occluded right distal SFA with moderately reduced ABI. His symptoms have been relatively stable with continued exertional right calf pain with overexertion.  He is able to do activities of daily living without significant limitations.  He continues to smoke more than 1 pack/day.  He also resumed drinking alcohol and currently is drinking 4 light beers daily.  No chest pain or worsening dyspnea.  He stopped taking aspirin due to easy bruising.  He was taking full dose aspirin.   Past Medical History:  Diagnosis Date  . Hyperlipidemia   . Stroke [facility])     Past Surgical History:  Procedure Laterality Date  . BACK SURGERY    . IR ANGIO EXTRACRAN SEL COM CAROTID INNOMINATE UNI L MOD SED  10/09/2017  . IR ANGIO  VERTEBRAL SEL SUBCLAVIAN INNOMINATE UNI R MOD SED  10/09/2017  . IR INTRAVSC STENT CERV CAROTID W/O EMB-PROT MOD SED INC ANGIO  10/09/2017  . IR PERCUTANEOUS ART THROMBECTOMY/INFUSION INTRACRANIAL INC DIAG ANGIO  10/09/2017  . IR RADIOLOGIST EVAL & MGMT  11/06/2017  . JOINT REPLACEMENT    . RADIOLOGY WITH ANESTHESIA N/A 10/09/2017   Procedure: RADIOLOGY WITH ANESTHESIA;  Surgeon: Radiologist, Medication, MD;  Location: [place];  Service: Radiology;  Laterality: N/A;  . TEE WITHOUT CARDIOVERSION N/A 01/26/2018   Procedure: TRANSESOPHAGEAL ECHOCARDIOGRAM (TEE);  Surgeon: Adrian Prows, MD;  Location: [facility] ENDOSCOPY;  Service: Cardiovascular;  Laterality: N/A;     Current Outpatient Medications  Medication Sig Dispense Refill  . atorvastatin (LIPITOR) 40 MG tablet Take 1 tablet (40 mg total) by mouth daily at 6 PM. 90 tablet 3  . clopidogrel (PLAVIX) 75 MG tablet Take 75 mg by mouth daily with breakfast.  1   No current facility-administered medications for this visit.     Allergies:   Patient has no known allergies.    Social History:  The patient  reports that he has been smoking cigarettes. He has a 40.00 pack-year smoking history. He has quit using smokeless tobacco. He reports that he does not drink alcohol or use drugs.   Family History:  The patient's family history includes COPD in his mother; Dementia in his father.    ROS:  Please see the history of present illness.   Otherwise, review  of systems are positive for none.   All other systems are reviewed and negative.    PHYSICAL EXAM: VS:  BP 120/62 (BP Location: Left Arm, Patient Position: Sitting, Cuff Size: Normal)   Pulse (!) 56   Ht 5\' 11"  (1.803 m)   Wt 164 lb (74.4 kg)   HC 97.2" (246.9 cm)   BMI 22.87 kg/m  , BMI Body mass index is 22.87 kg/m. GEN: Well nourished, well developed, in no acute distress  HEENT: normal  Neck: no JVD, carotid bruits, or masses Cardiac: RRR; no murmurs, rubs, or gallops,no edema Respiratory:   clear to auscultation bilaterally, normal work of breathing GI: soft, nontender, nondistended, + BS MS: no deformity or atrophy  Skin: warm and dry, no rash Neuro:  Strength and sensation are intact Psych: euthymic mood, full affect Vascular:  Radial pulses +2 on the right and +1 on the left.  Femoral pulses normal bilaterally.  Distal pulses are not palpable.   EKG:  EKG is ordered today. The ekg ordered today demonstrates sinus bradycardia with no significant ST or T wave changes.   Recent Labs: No results found for requested labs within last 8760 hours.    Lipid Panel    Component Value Date/Time   CHOL 92 04/25/2018 1550   TRIG 79 04/25/2018 1550   HDL 33 (L) 04/25/2018 1550   CHOLHDL 2.8 04/25/2018 1550   VLDL 25 10/10/2017 0337   LDLCALC 43 04/25/2018 1550      Wt Readings from Last 3 Encounters:  07/09/19 164 lb (74.4 kg)  01/08/19 170 lb (77.1 kg)  10/04/18 173 lb (78.5 kg)      PAD Screen 10/04/2018  Previous PAD dx? Yes  Previous surgical procedure? No  Pain with walking? Yes  Subsides with rest? Yes  Feet/toe relief with dangling? No  Painful, non-healing ulcers? No  Extremities discolored? No      ASSESSMENT AND PLAN:  1.  Peripheral arterial disease with moderate to severe claudication on the right side: Rutherford class II.  His symptoms are due to an occluded distal right SFA. I again discussed with him the importance of lifestyle changes and aggressive treatment of risk factors.  I asked him to resume aspirin at 81 mg daily. I did explain to him that if we consider revascularization, it would be much better if he is able to quit smoking first to improve long-term outcome.  2.  Tobacco use: I discussed with him the importance of smoking cessation.  3.  Hyperlipidemia: Continue treatment with atorvastatin with a target LDL of less than 70.   Disposition:   FU with me in 6 months     Signed, Lorine BearsMuhammad Dax Murguia, MD 07/09/19

## 2019-08-06 ENCOUNTER — Encounter: Payer: Self-pay | Admitting: Adult Health

## 2019-08-06 ENCOUNTER — Telehealth: Payer: Self-pay | Admitting: *Deleted

## 2019-08-06 NOTE — Telephone Encounter (Signed)
Last seen VV 4/20 with JM/NP and f/u prn.  Work asking for letter to Consulting [employer].  To JM/NP desk to review.

## 2019-08-06 NOTE — Telephone Encounter (Signed)
Given to MR.

## 2019-08-06 NOTE — Telephone Encounter (Signed)
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

## 2019-08-09 ENCOUNTER — Other Ambulatory Visit (HOSPITAL_COMMUNITY): Payer: Self-pay | Admitting: Interventional Radiology

## 2019-08-09 DIAGNOSIS — I639 Cerebral infarction, unspecified: Secondary | ICD-10-CM

## 2019-08-14 ENCOUNTER — Encounter (HOSPITAL_COMMUNITY): Payer: 59

## 2019-08-20 ENCOUNTER — Other Ambulatory Visit: Payer: Self-pay

## 2019-08-20 ENCOUNTER — Ambulatory Visit (HOSPITAL_COMMUNITY)
Admission: RE | Admit: 2019-08-20 | Discharge: 2019-08-20 | Disposition: A | Discharge status: Home / Self Care | Payer: 59 | Source: Ambulatory Visit | Attending: Interventional Radiology | Admitting: Interventional Radiology

## 2019-08-20 DIAGNOSIS — I639 Cerebral infarction, unspecified: Secondary | ICD-10-CM | POA: Diagnosis present

## 2019-08-21 ENCOUNTER — Telehealth (HOSPITAL_COMMUNITY): Payer: Self-pay

## 2019-08-21 NOTE — Telephone Encounter (Signed)
Called pt regarding recent US carotid, no answer, left vm for pt to f/u in 6 months. Call with any questions. AW

## 2019-11-07 ENCOUNTER — Other Ambulatory Visit: Payer: Self-pay | Admitting: Cardiovascular Disease

## 2019-11-07 NOTE — Telephone Encounter (Signed)
Please review for refill.  I don't see that Dr. Kirke Corin prescribed this for the patient.  Thanks

## 2019-11-07 NOTE — Telephone Encounter (Signed)
This is a [facility] pt 

## 2019-11-08 MED ORDER — CLOPIDOGREL BISULFATE 75 MG PO TABS: 75.0000 mg | ORAL_TABLET | Freq: Every day | ORAL | 1 refills | Status: DC

## 2019-11-08 NOTE — Telephone Encounter (Signed)
Ok to fill 

## 2019-11-08 NOTE — Telephone Encounter (Signed)
Plavix 75 mg qd refilled #90 R-1.

## 2020-01-07 ENCOUNTER — Ambulatory Visit: Payer: 59 | Admitting: Cardiovascular Disease

## 2020-01-14 ENCOUNTER — Ambulatory Visit: Payer: 59 | Admitting: Cardiovascular Disease

## 2020-01-14 ENCOUNTER — Encounter: Payer: Self-pay | Admitting: Cardiovascular Disease

## 2020-01-14 ENCOUNTER — Other Ambulatory Visit: Payer: Self-pay

## 2020-01-14 VITALS — BP 116/58 | HR 71 | Resp 15 | Ht 71.0 in | Wt 170.4 lb

## 2020-01-14 DIAGNOSIS — I63231 Cerebral infarction due to unspecified occlusion or stenosis of right carotid arteries: Secondary | ICD-10-CM

## 2020-01-14 DIAGNOSIS — I10 Essential (primary) hypertension: Secondary | ICD-10-CM | POA: Diagnosis not present

## 2020-01-14 DIAGNOSIS — Z79899 Other long term (current) drug therapy: Secondary | ICD-10-CM

## 2020-01-14 DIAGNOSIS — E785 Hyperlipidemia, unspecified: Secondary | ICD-10-CM

## 2020-01-14 DIAGNOSIS — I739 Peripheral vascular disease, unspecified: Secondary | ICD-10-CM

## 2020-01-14 LAB — CBC
Hematocrit: 45.3 % (ref 37.5–51.0)
Hemoglobin: 15.6 g/dL (ref 13.0–17.7)
MCH: 31.1 pg (ref 26.6–33.0)
MCHC: 34.4 g/dL (ref 31.5–35.7)
MCV: 90 fL (ref 79–97)
Platelets: 198 10*3/uL (ref 150–450)
RBC: 5.02 x10E6/uL (ref 4.14–5.80)
RDW: 14 % (ref 11.6–15.4)
WBC: 7.9 10*3/uL (ref 3.4–10.8)

## 2020-01-14 LAB — COMPREHENSIVE METABOLIC PANEL
ALT: 13 IU/L (ref 0–44)
AST: 19 IU/L (ref 0–40)
Albumin/Globulin Ratio: 1.7 (ref 1.2–2.2)
Albumin: 4.5 g/dL (ref 3.8–4.8)
Alkaline Phosphatase: 87 IU/L (ref 39–117)
BUN/Creatinine Ratio: 7 — ABNORMAL LOW (ref 10–24)
BUN: 6 mg/dL — ABNORMAL LOW (ref 8–27)
Bilirubin Total: 0.9 mg/dL (ref 0.0–1.2)
CO2: 23 mmol/L (ref 20–29)
Calcium: 9.4 mg/dL (ref 8.6–10.2)
Chloride: 102 mmol/L (ref 96–106)
Creatinine, Ser: 0.83 mg/dL (ref 0.76–1.27)
GFR calc Af Amer: 109 mL/min/{1.73_m2} (ref >59)
GFR calc non Af Amer: 94 mL/min/{1.73_m2} (ref >59)
Globulin, Total: 2.6 g/dL (ref 1.5–4.5)
Glucose: 84 mg/dL (ref 65–99)
Potassium: 4.6 mmol/L (ref 3.5–5.2)
Sodium: 140 mmol/L (ref 134–144)
Total Protein: 7.1 g/dL (ref 6.0–8.5)

## 2020-01-14 LAB — LIPID PANEL
Chol/HDL Ratio: 2.5 ratio (ref 0.0–5.0)
Cholesterol, Total: 108 mg/dL (ref 100–199)
HDL: 43 mg/dL (ref >39)
LDL Chol Calc (NIH): 50 mg/dL (ref 0–99)
Triglycerides: 68 mg/dL (ref 0–149)
VLDL Cholesterol Cal: 15 mg/dL (ref 5–40)

## 2020-01-14 MED ORDER — CLOPIDOGREL BISULFATE 75 MG PO TABS: 75.0000 mg | ORAL_TABLET | Freq: Every day | ORAL | 3 refills | Status: DC

## 2020-01-14 MED ORDER — ATORVASTATIN CALCIUM 40 MG PO TABS: 40.0000 mg | ORAL_TABLET | Freq: Every day | ORAL | 3 refills | Status: DC

## 2020-01-14 NOTE — Progress Notes (Signed)
Cardiology Office Note   Date:  01/14/2020   ID:  Saed Hudlow, DOB Aug 03, 1956, MRN [id number]  PCP:  Blair Heys, PA-C  Cardiologist:  Kathlyn Sacramento, MD   No chief complaint on file.     History of Present Illness: Raymond Lowery is a 64 y.o. male who is here today for follow-up visit regarding  peripheral arterial disease.   He has chronic medical conditions that include hypertension, hyperlipidemia and tobacco use. The patient was hospitalized in December 2018 with right MCA stroke due to right MCA occlusion from suspected embolism from proximal right ICA with stenosis.  He was treated successfully with mechanical embolectomy and rescue ICA stenting. Echocardiogram showed normal LV systolic function with no significant valvular abnormalities.  TEE showed PFO with right-to-left shunt with Valsalva.  His PFO was not felt to be the source of his stroke.  He used to be a heavy alcohol drinker before his stroke but slowed down after the stroke. He has no family history of coronary artery disease.  He is being followed for peripheral arterial disease with known occluded right distal SFA with moderately reduced ABI.  He reports that his right calf claudication is now very mild and not limiting him.  He does complain of back pain that started after a fall.  The pain is worse with sitting and improves with walking.  He denies chest pain or shortness of breath.  He wants to quit smoking and cut down to few cigarettes a day.  He is trying to use e-cigarettes to help him quit.  Past Medical History:  Diagnosis Date  . Hyperlipidemia   . Stroke [facility])     Past Surgical History:  Procedure Laterality Date  . BACK SURGERY    . IR ANGIO EXTRACRAN SEL COM CAROTID INNOMINATE UNI L MOD SED  10/09/2017  . IR ANGIO VERTEBRAL SEL SUBCLAVIAN INNOMINATE UNI R MOD SED  10/09/2017  . IR INTRAVSC STENT CERV CAROTID W/O EMB-PROT MOD SED INC ANGIO  10/09/2017  . IR PERCUTANEOUS ART  THROMBECTOMY/INFUSION INTRACRANIAL INC DIAG ANGIO  10/09/2017  . IR RADIOLOGIST EVAL & MGMT  11/06/2017  . JOINT REPLACEMENT    . RADIOLOGY WITH ANESTHESIA N/A 10/09/2017   Procedure: RADIOLOGY WITH ANESTHESIA;  Surgeon: Radiologist, Medication, MD;  Location: [place];  Service: Radiology;  Laterality: N/A;  . TEE WITHOUT CARDIOVERSION N/A 01/26/2018   Procedure: TRANSESOPHAGEAL ECHOCARDIOGRAM (TEE);  Surgeon: Adrian Prows, MD;  Location: [facility] ENDOSCOPY;  Service: Cardiovascular;  Laterality: N/A;     Current Outpatient Medications  Medication Sig Dispense Refill  . aspirin EC 81 MG tablet Take 1 tablet (81 mg total) by mouth daily. 90 tablet 3  . atorvastatin (LIPITOR) 40 MG tablet Take 1 tablet (40 mg total) by mouth daily at 6 PM. 90 tablet 3  . clopidogrel (PLAVIX) 75 MG tablet Take 1 tablet (75 mg total) by mouth daily with breakfast. 90 tablet 1   No current facility-administered medications for this visit.    Allergies:   Patient has no known allergies.    Social History:  The patient  reports that he has been smoking cigarettes. He has a 40.00 pack-year smoking history. He has quit using smokeless tobacco. He reports that he does not drink alcohol or use drugs.   Family History:  The patient's family history includes COPD in his mother; Dementia in his father.    ROS:  Please see the history of present illness.   Otherwise, review of  systems are positive for none.   All other systems are reviewed and negative.    PHYSICAL EXAM: VS:  BP (!) 116/58   Pulse 71   Resp 15   Ht 5\' 11"  (1.803 m)   Wt 170 lb 6.4 oz (77.3 kg)   SpO2 98%   BMI 23.77 kg/m  , BMI Body mass index is 23.77 kg/m. GEN: Well nourished, well developed, in no acute distress  HEENT: normal  Neck: no JVD, carotid bruits, or masses Cardiac: RRR; no murmurs, rubs, or gallops,no edema Respiratory:  clear to auscultation bilaterally, normal work of breathing GI: soft, nontender, nondistended, + BS MS: no  deformity or atrophy  Skin: warm and dry, no rash Neuro:  Strength and sensation are intact Psych: euthymic mood, full affect Vascular:  Radial pulses +2 on the right and +1 on the left.  Femoral pulses normal bilaterally.  Distal pulses are not palpable.   EKG:  EKG is ordered today. The ekg ordered today demonstrates normal sinus rhythm with no significant ST or T wave changes.   Recent Labs: No results found for requested labs within last 8760 hours.    Lipid Panel    Component Value Date/Time   CHOL 92 04/25/2018 1550   TRIG 79 04/25/2018 1550   HDL 33 (L) 04/25/2018 1550   CHOLHDL 2.8 04/25/2018 1550   VLDL 25 10/10/2017 0337   LDLCALC 43 04/25/2018 1550      Wt Readings from Last 3 Encounters:  01/14/20 170 lb 6.4 oz (77.3 kg)  07/09/19 164 lb (74.4 kg)  01/08/19 170 lb (77.1 kg)      PAD Screen 10/04/2018  Previous PAD dx? Yes  Previous surgical procedure? No  Pain with walking? Yes  Subsides with rest? Yes  Feet/toe relief with dangling? No  Painful, non-healing ulcers? No  Extremities discolored? No      ASSESSMENT AND PLAN:  1.  Peripheral arterial disease with mild to moderate right calf claudication:  His symptoms are due to an occluded distal right SFA.  He reports improvement in symptoms over the last 6 to 12 months.  He is cutting down on tobacco use. Continue long-term treatment with clopidogrel. His back pain is not vascular in etiology and seems to be suggestive of an LS spine pathology.  His femoral pulses are normal.  Will need screening for abdominal aortic aneurysm in few years.  2.  Tobacco use: I discussed with him the importance of smoking cessation.  He already cut down.  3.  Hyperlipidemia: Continue treatment with atorvastatin with a target LDL of less than 70.  I requested routine labs including lipid and liver profile    Disposition:   FU with me in 12 months     Signed, 14/02/2018, MD 01/14/20

## 2020-01-14 NOTE — Patient Instructions (Signed)
Medication Instructions:  Continue current medications  *If you need a refill on your cardiac medications before your next appointment, please call your pharmacy*   Lab Work: Fasting Lipid, CBC and CMP  If you have labs (blood work) drawn today and your tests are completely normal, you will receive your results only by: Marland Kitchen MyChart Message (if you have MyChart) OR . A paper copy in the mail If you have any lab test that is abnormal or we need to change your treatment, we will call you to review the results.   Testing/Procedures: None Ordered   Follow-Up: At [facility], you and your health needs are our priority.  As part of our continuing mission to provide you with exceptional heart care, we have created designated Provider Care Teams.  These Care Teams include your primary Cardiologist (physician) and Advanced Practice Providers (APPs -  Physician Assistants and Nurse Practitioners) who all work together to provide you with the care you need, when you need it.  We recommend signing up for the patient portal called "MyChart".  Sign up information is provided on this After Visit Summary.  MyChart is used to connect with patients for Virtual Visits (Telemedicine).  Patients are able to view lab/test results, encounter notes, upcoming appointments, etc.  Non-urgent messages can be sent to your provider as well.   To learn more about what you can do with MyChart, go to [email or web address].    Your next appointment:   1 year(s)  The format for your next appointment:   In Person  Provider:   You may see [name], MD or one of the following Advanced Practice Providers on your designated Care Team:    Corine Shelter, PA-C  [place], [place]  Edd Fabian, [place]

## 2020-01-16 ENCOUNTER — Encounter: Payer: Self-pay | Admitting: *Deleted

## 2020-02-24 ENCOUNTER — Telehealth (HOSPITAL_COMMUNITY): Payer: Self-pay

## 2020-02-24 NOTE — Telephone Encounter (Signed)
Called to schedule us carotid, no answer, left vm. AW  

## 2020-03-09 ENCOUNTER — Telehealth (HOSPITAL_COMMUNITY): Payer: Self-pay

## 2020-03-09 NOTE — Telephone Encounter (Signed)
Called to schedule us carotid, no answer, left vm. AW  

## 2020-03-31 ENCOUNTER — Telehealth (HOSPITAL_COMMUNITY): Payer: Self-pay

## 2020-03-31 NOTE — Telephone Encounter (Signed)
Called to schedule us carotid, no answer, left vm. AW  

## 2020-05-21 ENCOUNTER — Other Ambulatory Visit: Payer: Self-pay | Admitting: Cardiovascular Disease

## 2020-05-21 NOTE — Telephone Encounter (Signed)
Refill Request.  

## 2020-08-14 ENCOUNTER — Telehealth: Payer: Self-pay | Admitting: Cardiovascular Disease

## 2020-08-14 NOTE — Telephone Encounter (Signed)
Please schedule overdue F/U appointment for refills. Thank you! 

## 2020-08-14 NOTE — Telephone Encounter (Signed)
*  STAT* If patient is at the pharmacy, call can be transferred to refill team.   1. Which medications need to be refilled? (please list name of each medication and dose if known)   Plavix 75 mg po q d  2. Which pharmacy/location (including street and city if local pharmacy) is medication to be sent to?  [id number] - [place], [place] - [address] AT [facility] [address]  [address], [place] [address]   3. Do they need a 30 day or 90 day supply? 90  [facility]

## 2020-09-11 ENCOUNTER — Ambulatory Visit: Payer: 59 | Admitting: Family

## 2020-09-15 ENCOUNTER — Encounter: Payer: Self-pay | Admitting: Family

## 2020-09-15 ENCOUNTER — Other Ambulatory Visit: Payer: Self-pay

## 2020-09-15 ENCOUNTER — Ambulatory Visit: Payer: 59 | Admitting: Family

## 2020-09-15 VITALS — BP 140/60 | HR 72 | Ht 71.0 in | Wt 164.0 lb

## 2020-09-15 DIAGNOSIS — I739 Peripheral vascular disease, unspecified: Secondary | ICD-10-CM

## 2020-09-15 DIAGNOSIS — E785 Hyperlipidemia, unspecified: Secondary | ICD-10-CM

## 2020-09-15 DIAGNOSIS — Z720 Tobacco use: Secondary | ICD-10-CM

## 2020-09-15 DIAGNOSIS — M79602 Pain in left arm: Secondary | ICD-10-CM | POA: Diagnosis not present

## 2020-09-15 NOTE — Progress Notes (Signed)
Office Visit    Patient Name: Raymond Lowery Date of Encounter: 09/15/2020  Primary Care Provider:  [name], PA-C Primary Cardiologist:  [name], MD Electrophysiologist:  None   Chief Complaint    Raymond Lowery is a 64 y.o. male with a hx of peripheral arterial disease with mild to moderate right calf claudication, HTN, HLD, tobacco use, CVA, PFO presents today for follow-up of PAD  Past Medical History    Past Medical History:  Diagnosis Date  . Carotid artery disease (HCC)    s/p L ICA stent 2018   . Hyperlipidemia   . PAD (peripheral artery disease) (HCC)    Known occluded distal right SFA with moderately reduced ABI  . PFO (patent foramen ovale)   . Stroke (HCC)   . Tobacco use    Past Surgical History:  Procedure Laterality Date  . BACK SURGERY    . IR ANGIO EXTRACRAN SEL COM CAROTID INNOMINATE UNI L MOD SED  10/09/2017  . IR ANGIO VERTEBRAL SEL SUBCLAVIAN INNOMINATE UNI R MOD SED  10/09/2017  . IR INTRAVSC STENT CERV CAROTID W/O EMB-PROT MOD SED INC ANGIO  10/09/2017  . IR PERCUTANEOUS ART THROMBECTOMY/INFUSION INTRACRANIAL INC DIAG ANGIO  10/09/2017  . IR RADIOLOGIST EVAL & MGMT  11/06/2017  . JOINT REPLACEMENT    . RADIOLOGY WITH ANESTHESIA N/A 10/09/2017   Procedure: RADIOLOGY WITH ANESTHESIA;  Surgeon: Radiologist, Medication, MD;  Location: [facility];  Service: Radiology;  Laterality: N/A;  . TEE WITHOUT CARDIOVERSION N/A 01/26/2018   Procedure: TRANSESOPHAGEAL ECHOCARDIOGRAM (TEE);  Surgeon: Yates Decamp, MD;  Location: [facility] ENDOSCOPY;  Service: Cardiovascular;  Laterality: N/A;    Allergies  No Known Allergies  History of Present Illness    Raymond Lowery is a 64 y.o. male with a hx of peripheral arterial disease with mild to moderate right calf claudication, HTN, HLD, tobacco use, CVA, PFO, carotid artery disease s/p right ICA stenting.  He was last seen 01/14/2020 by Dr. Kirke Corin.  He was hospitalized December 2018 with right MCA stroke due  to right MCA occlusion, suspected embolism from proximal right ICA with stenosis.  He was treated with mechanical embolectomy and rescue ICA stenting.  Echo with normal LVEF, no significant valvular abnormalities.  He had TEE which showed PFO with right to left shunt with Valsalva.  However, his PFO was not felt to be the source of his stroke.  As such, repair was deferred.  He has reduced his alcohol intake since stroke.  He follows with Dr. Kirke Corin for known occluded distal right SFA with moderately reduced ABI.  When he was last seen 01/14/2020 he noted improvement in his symptoms of claudication.  He tested positive for COVID 19 08/07/20 after presenting with cough, body ache, fever, chills and was treated with Azithromycin and Dexathesone.   Reports intermittent in his left arm. It is sporadic and does not radiate to neck nor chest. Onset 2 weeks ago. It is happening every 2-3 days. It lasts for the majority of the day and has no exacerbating or relieving factors. He has not taken ibuprofen or Tylenol. Does endorse increased stress at work.   Reports no shortness of breath nor dyspnea on exertion. Reports no chest pain, pressure, or tightness. No edema, orthopnea, PND. Reports no palpitations.   EKGs/Labs/Other Studies Reviewed:   The following studies were reviewed today:  EKG:  EKG is ordered today.  The ekg ordered today demonstrates NSR 72 bpm with no acute ST/T wave  changes.   Recent Labs: 01/14/2020: ALT 13; BUN 6; Creatinine, Ser 0.83; Hemoglobin 15.6; Platelets 198; Potassium 4.6; Sodium 140  Recent Lipid Panel    Component Value Date/Time   CHOL 108 01/14/2020 0853   TRIG 68 01/14/2020 0853   HDL 43 01/14/2020 0853   CHOLHDL 2.5 01/14/2020 0853   CHOLHDL 2.8 04/25/2018 1550   VLDL 25 10/10/2017 0337   LDLCALC 50 01/14/2020 0853   LDLCALC 43 04/25/2018 1550    Home Medications   Current Meds  Medication Sig  . albuterol (VENTOLIN HFA) 108 (90 Base) MCG/ACT inhaler  SMARTSIG:2 Puff(s) By Mouth Every 6 Hours PRN  . atorvastatin (LIPITOR) 40 MG tablet Take 1 tablet (40 mg total) by mouth daily at 6 PM.  . clopidogrel (PLAVIX) 75 MG tablet TAKE 1 TABLET (75 MG TOTAL) BY MOUTH DAILY WITH BREAKFAST.     Review of Systems  All other systems reviewed and are otherwise negative except as noted above.  Physical Exam    VS:  BP 140/60 (BP Location: Left Arm, Patient Position: Sitting, Cuff Size: Normal)   Pulse 72   Ht 5\' 11"  (1.803 m)   Wt 164 lb (74.4 kg)   SpO2 98%   BMI 22.87 kg/m  , BMI Body mass index is 22.87 kg/m.  Wt Readings from Last 3 Encounters:  09/15/20 164 lb (74.4 kg)  01/14/20 170 lb 6.4 oz (77.3 kg)  07/09/19 164 lb (74.4 kg)    GEN: Well nourished, well developed, in no acute distress. HEENT: normal. Neck: Supple, no JVD, carotid bruits, or masses. Cardiac: RRR, no murmurs, rubs, or gallops. No clubbing, cyanosis, edema.  Radials/PT 2+ and equal bilaterally.  Respiratory:  Respirations regular and unlabored, clear to auscultation bilaterally. GI: Soft, nontender, nondistended. MS: No deformity or atrophy.  Left arm nontender to palpation.  Bilateral arm with full ROM.   Skin: Warm and dry, no rash. Neuro:  Strength and sensation are intact. Psych: Normal affect.  Assessment & Plan    1. Left arm pain - Reports intermittent pain in the left bicep that does not radiate to the shoulder nor wrist nor chest.  Onset over the last 2 weeks.  No noted exacerbating or relieving factors.  Denies injury.  EKG today without acute ST/T wave changes.  The left arm pain is nonexertional and is not associate with chest pain or diaphoresis or shortness of breath.  Symptoms are atypical for angina reassurance provided.  Likely musculoskeletal versus near pain.  Encouraged use of Tylenol or ibuprofen and heat, ice.  Prior to follow-up with primary care provider.  2. PAD with mild to moderate right calf claudication- Reports stable claudication  symptoms. Tells me he does a lot of walking with work. Discussed smoking cessation, as below.  Continue Plavix.  3. Tobacco use- Smoking cessation encouraged. Recommend utilization of 1800QUITNOW.  He is very motivated to quit and encouraged to utilize resources.  4. HLD- Lipid panel 01/14/20 with LDL 50. Continue Atorvastatin 40mg  daily.   5. Carotid artery disease - Duplex 08/2019 right ICA stent less than 50% stenosis, left ICA 1-39% stenosis.  Continue Plavix and atorvastatin.  Disposition: Follow up in 6 month(s) with Dr. or APP   Signed, 09/2019, NP 09/15/2020, 2:42 PM [facility]

## 2020-09-15 NOTE — Patient Instructions (Addendum)
Medication Instructions:  No medication changes today.   *If you need a refill on your cardiac medications before your next appointment, please call your pharmacy*  Lab Work: No lab work today.  Testing/Procedures: Your EKG today shows normal sinus rhythm. This is a great result!   Follow-Up: At [facility], you and your health needs are our priority.  As part of our continuing mission to provide you with exceptional heart care, we have created designated Provider Care Teams.  These Care Teams include your primary Cardiologist (physician) and Advanced Practice Providers (APPs -  Physician Assistants and Nurse Practitioners) who all work together to provide you with the care you need, when you need it.  We recommend signing up for the patient portal called "MyChart".  Sign up information is provided on this After Visit Summary.  MyChart is used to connect with patients for Virtual Visits (Telemedicine).  Patients are able to view lab/test results, encounter notes, upcoming appointments, etc.  Non-urgent messages can be sent to your provider as well.   To learn more about what you can do with MyChart, go to [email or web address].    Your next appointment:   6 month(s)  The format for your next appointment:   In Person  Provider:   [name], [place]   Other Instructions  You may try heat or ice for your arm pain. You may use Acetaminophe/Tylenol or Ibuprofen  Recommend 1-800-QUITNOW for resources regarding quitting smoking.   Coping with Quitting Smoking  Quitting smoking is a physical and mental challenge. You will face cravings, withdrawal symptoms, and temptation. Before quitting, work with your health care provider to make a plan that can help you cope. Preparation can help you quit and keep you from giving in. How can I cope with cravings? Cravings usually last for 5-10 minutes. If you get through it, the craving will pass. Consider taking the following actions to help  you cope with cravings:  Keep your mouth busy: ? Chew sugar-free gum. ? Suck on hard candies or a straw. ? Brush your teeth.  Keep your hands and body busy: ? Immediately change to a different activity when you feel a craving. ? Squeeze or play with a ball. ? Do an activity or a hobby, like making bead jewelry, practicing needlepoint, or working with wood. ? Mix up your normal routine. ? Take a short exercise break. Go for a quick walk or run up and down stairs. ? Spend time in public places where smoking is not allowed.  Focus on doing something kind or helpful for someone else.  Call a friend or family member to talk during a craving.  Join a support group.  Call a quit line, such as 1-800-QUIT-NOW.  Talk with your health care provider about medicines that might help you cope with cravings and make quitting easier for you. How can I deal with withdrawal symptoms? Your body may experience negative effects as it tries to get used to not having nicotine in the system. These effects are called withdrawal symptoms. They may include:  Feeling hungrier than normal.  Trouble concentrating.  Irritability.  Trouble sleeping.  Feeling depressed.  Restlessness and agitation.  Craving a cigarette. To manage withdrawal symptoms:  Avoid places, people, and activities that trigger your cravings.  Remember why you want to quit.  Get plenty of sleep.  Avoid coffee and other caffeinated drinks. These may worsen some of your symptoms. How can I handle social situations? Social situations can be difficult when  you are quitting smoking, especially in the first few weeks. To manage this, you can:  Avoid parties, bars, and other social situations where people might be smoking.  Avoid alcohol.  Leave right away if you have the urge to smoke.  Explain to your family and friends that you are quitting smoking. Ask for understanding and support.  Plan activities with friends or  family where smoking is not an option. What are some ways I can cope with stress? Wanting to smoke may cause stress, and stress can make you want to smoke. Find ways to manage your stress. Relaxation techniques can help. For example:  Breathe slowly and deeply, in through your nose and out through your mouth.  Listen to soothing, relaxing music.  Talk with a family member or friend about your stress.  Light a candle.  Soak in a bath or take a shower.  Think about a peaceful place. What are some ways I can prevent weight gain? Be aware that many people gain weight after they quit smoking. However, not everyone does. To keep from gaining weight, have a plan in place before you quit and stick to the plan after you quit. Your plan should include:  Having healthy snacks. When you have a craving, it may help to: ? Eat plain popcorn, crunchy carrots, celery, or other cut vegetables. ? Chew sugar-free gum.  Changing how you eat: ? Eat small portion sizes at meals. ? Eat 4-6 small meals throughout the day instead of 1-2 large meals a day. ? Be mindful when you eat. Do not watch television or do other things that might distract you as you eat.  Exercising regularly: ? Make time to exercise each day. If you do not have time for a long workout, do short bouts of exercise for 5-10 minutes several times a day. ? Do some form of strengthening exercise, like weight lifting, and some form of aerobic exercise, like running or swimming.  Drinking plenty of water or other low-calorie or no-calorie drinks. Drink 6-8 glasses of water daily, or as much as instructed by your health care provider. Summary  Quitting smoking is a physical and mental challenge. You will face cravings, withdrawal symptoms, and temptation to smoke again. Preparation can help you as you go through these challenges.  You can cope with cravings by keeping your mouth busy (such as by chewing gum), keeping your body and hands busy,  and making calls to family, friends, or a helpline for people who want to quit smoking.  You can cope with withdrawal symptoms by avoiding places where people smoke, avoiding drinks with caffeine, and getting plenty of rest.  Ask your health care provider about the different ways to prevent weight gain, avoid stress, and handle social situations. This information is not intended to replace advice given to you by your health care provider. Make sure you discuss any questions you have with your health care provider. Document Revised: 09/29/2017 Document Reviewed: 10/14/2016 Elsevier Patient Education  2020 [employer].

## 2021-01-17 ENCOUNTER — Other Ambulatory Visit: Payer: Self-pay | Admitting: Cardiovascular Disease

## 2021-01-17 DIAGNOSIS — I63231 Cerebral infarction due to unspecified occlusion or stenosis of right carotid arteries: Secondary | ICD-10-CM

## 2021-02-24 ENCOUNTER — Other Ambulatory Visit: Payer: Self-pay | Admitting: Cardiovascular Disease

## 2021-02-24 NOTE — Telephone Encounter (Signed)
Rx request sent to pharmacy.  

## 2021-03-16 ENCOUNTER — Ambulatory Visit: Payer: 59 | Admitting: Cardiovascular Disease

## 2021-03-18 ENCOUNTER — Encounter: Payer: Self-pay | Admitting: Cardiovascular Disease

## 2021-03-18 ENCOUNTER — Other Ambulatory Visit: Payer: Self-pay

## 2021-03-18 ENCOUNTER — Ambulatory Visit: Payer: 59 | Admitting: Cardiovascular Disease

## 2021-03-18 VITALS — BP 130/60 | HR 61 | Ht 71.0 in | Wt 160.4 lb

## 2021-03-18 DIAGNOSIS — I6523 Occlusion and stenosis of bilateral carotid arteries: Secondary | ICD-10-CM | POA: Diagnosis not present

## 2021-03-18 DIAGNOSIS — I739 Peripheral vascular disease, unspecified: Secondary | ICD-10-CM | POA: Diagnosis not present

## 2021-03-18 DIAGNOSIS — Z720 Tobacco use: Secondary | ICD-10-CM

## 2021-03-18 DIAGNOSIS — E785 Hyperlipidemia, unspecified: Secondary | ICD-10-CM

## 2021-03-18 NOTE — Patient Instructions (Signed)
Medication Instructions:  Your physician recommends that you continue on your current medications as directed. Please refer to the Current Medication list given to you today.  *If you need a refill on your cardiac medications before your next appointment, please call your pharmacy*   Lab Work: None ordered If you have labs (blood work) drawn today and your tests are completely normal, you will receive your results only by: Marland Kitchen MyChart Message (if you have MyChart) OR . A paper copy in the mail If you have any lab test that is abnormal or we need to change your treatment, we will call you to review the results.   Testing/Procedures: Your physician has requested that you have a carotid duplex. This test is an ultrasound of the carotid arteries in your neck. It looks at blood flow through these arteries that supply the brain with blood. Allow one hour for this exam. There are no restrictions or special instructions.     Follow-Up: At [facility], you and your health needs are our priority.  As part of our continuing mission to provide you with exceptional heart care, we have created designated Provider Care Teams.  These Care Teams include your primary Cardiologist (physician) and Advanced Practice Providers (APPs -  Physician Assistants and Nurse Practitioners) who all work together to provide you with the care you need, when you need it.  We recommend signing up for the patient portal called "MyChart".  Sign up information is provided on this After Visit Summary.  MyChart is used to connect with patients for Virtual Visits (Telemedicine).  Patients are able to view lab/test results, encounter notes, upcoming appointments, etc.  Non-urgent messages can be sent to your provider as well.   To learn more about what you can do with MyChart, go to [email or web address].    Your next appointment:   Your physician wants you to follow-up in: 6 months You will receive a reminder letter in the mail  two months in advance. If you don't receive a letter, please call our office to schedule the follow-up appointment.   The format for your next appointment:   In Person  Provider:   You may see [name], MD or one of the following Advanced Practice Providers on your designated Care Team:    Nicolasa Ducking, NP  Eula Listen, PA-C  Marisue Ivan, PA-C  Cad[place], [place]  Gillian Shields, NP    Other Instructions N/A

## 2021-03-18 NOTE — Progress Notes (Signed)
Cardiology Office Note   Date:  03/18/2021   ID:  Raymond Lowery, DOB 1956/09/20, MRN [id number]  PCP:  Vivien Presto, MD  Cardiologist:  [name], MD   Chief Complaint  Patient presents with  . Other    6 month f/u c/o back lower pain. Pt mentioned he D/C Plavix x1 wk. Meds reviewed verbally with pt.      History of Present Illness: Raymond Lowery is a 65 y.o. male who is here today for follow-up visit regarding  peripheral arterial disease.   He has chronic medical conditions that include hypertension, hyperlipidemia and tobacco use. The patient was hospitalized in December 2018 with right MCA stroke due to right MCA occlusion from suspected embolism from proximal right ICA with stenosis.  He was treated successfully with mechanical embolectomy and rescue ICA stenting. Echocardiogram showed normal LV systolic function with no significant valvular abnormalities.  TEE showed PFO with right-to-left shunt with Valsalva.  His PFO was not felt to be the source of his stroke.  He used to be a heavy alcohol drinker before his stroke but slowed down after the stroke. He has no family history of coronary artery disease.  He is being followed for peripheral arterial disease with known occluded right distal SFA with moderately reduced ABI.  He went to the beach recently and carried some heavy luggage and went fishing.  He started having back discomfort after bath.  He reports no calf claudication.  No chest pain or worsening dyspnea.  Plavix was causing significant bruising and thus he switched to aspirin 81 mg daily.  Past Medical History:  Diagnosis Date  . Carotid artery disease (HCC)    s/p L ICA stent 2018   . Hyperlipidemia   . PAD (peripheral artery disease) (HCC)    Known occluded distal right SFA with moderately reduced ABI  . PFO (patent foramen ovale)   . Stroke (HCC)   . Tobacco use     Past Surgical History:  Procedure Laterality Date  . BACK SURGERY     . IR ANGIO EXTRACRAN SEL COM CAROTID INNOMINATE UNI L MOD SED  10/09/2017  . IR ANGIO VERTEBRAL SEL SUBCLAVIAN INNOMINATE UNI R MOD SED  10/09/2017  . IR INTRAVSC STENT CERV CAROTID W/O EMB-PROT MOD SED INC ANGIO  10/09/2017  . IR PERCUTANEOUS ART THROMBECTOMY/INFUSION INTRACRANIAL INC DIAG ANGIO  10/09/2017  . IR RADIOLOGIST EVAL & MGMT  11/06/2017  . JOINT REPLACEMENT    . RADIOLOGY WITH ANESTHESIA N/A 10/09/2017   Procedure: RADIOLOGY WITH ANESTHESIA;  Surgeon: Radiologist, Medication, MD;  Location: [facility] OR;  Service: Radiology;  Laterality: N/A;  . TEE WITHOUT CARDIOVERSION N/A 01/26/2018   Procedure: TRANSESOPHAGEAL ECHOCARDIOGRAM (TEE);  Surgeon: Yates Decamp, MD;  Location[facility] ENDOSCOPY;  Service: Cardiovascular;  Laterality: N/A;     Current Outpatient Medications  Medication Sig Dispense Refill  . albuterol (VENTOLIN HFA) 108 (90 Base) MCG/ACT inhaler SMARTSIG:2 Puff(s) By Mouth Every 6 Hours PRN    . aspirin 81 MG chewable tablet Chew by mouth daily.    Marland Kitchen atorvastatin (LIPITOR) 40 MG tablet TAKE 1 TABLET(40 MG) BY MOUTH DAILY AT 6 PM 90 tablet 0   No current facility-administered medications for this visit.    Allergies:   Patient has no known allergies.    Social History:  The patient  reports that he has been smoking cigarettes. He has a 40.00 pack-year smoking history. He has quit using smokeless tobacco. He reports current  alcohol use. He reports that he does not use drugs.   Family History:  The patient's family history includes COPD in his mother; Dementia in his father.    ROS:  Please see the history of present illness.   Otherwise, review of systems are positive for none.   All other systems are reviewed and negative.    PHYSICAL EXAM: VS:  BP 130/60 (BP Location: Left Arm, Patient Position: Sitting, Cuff Size: Normal)   Pulse 61   Ht 5\' 11"  (1.803 m)   Wt 160 lb 6 oz (72.7 kg)   SpO2 97%   BMI 22.37 kg/m  , BMI Body mass index is 22.37 kg/m. GEN: Well  nourished, well developed, in no acute distress  HEENT: normal  Neck: no JVD, carotid bruits, or masses Cardiac: RRR; no murmurs, rubs, or gallops,no edema Respiratory:  clear to auscultation bilaterally, normal work of breathing GI: soft, nontender, nondistended, + BS MS: no deformity or atrophy  Skin: warm and dry, no rash Neuro:  Strength and sensation are intact Psych: euthymic mood, full affect Vascular:  Radial pulses +2 on the right and +1 on the left.  Femoral pulses normal bilaterally.  Distal pulses are not palpable.   EKG:  EKG is ordered today. The ekg ordered today demonstrates normal sinus rhythm with no significant ST or T wave changes.   Recent Labs: No results found for requested labs within last 8760 hours.    Lipid Panel    Component Value Date/Time   CHOL 108 01/14/2020 0853   TRIG 68 01/14/2020 0853   HDL 43 01/14/2020 0853   CHOLHDL 2.5 01/14/2020 0853   CHOLHDL 2.8 04/25/2018 1550   VLDL 25 10/10/2017 0337   LDLCALC 50 01/14/2020 0853   LDLCALC 43 04/25/2018 1550      Wt Readings from Last 3 Encounters:  03/18/21 160 lb 6 oz (72.7 kg)  09/15/20 164 lb (74.4 kg)  01/14/20 170 lb 6.4 oz (77.3 kg)      PAD Screen 10/04/2018  Previous PAD dx? Yes  Previous surgical procedure? No  Pain with walking? Yes  Subsides with rest? Yes  Feet/toe relief with dangling? No  Painful, non-healing ulcers? No  Extremities discolored? No      ASSESSMENT AND PLAN:  1.  Peripheral arterial disease with mild to moderate right calf claudication:  His symptoms are due to an occluded distal right SFA.  Symptoms are not lifestyle limiting and thus I recommend continuing medical therapy.  2.  Tobacco use: I discussed with him the importance of smoking cessation.   3.  Hyperlipidemia: Continue treatment with atorvastatin.  His recent lipid profile showed an LDL of 51.  4.  Low back pain: Suspect some lumbar spine etiology.  If symptoms do not improve, recommend  imaging for his back likely with MRI. I do not think he has significant iliac disease given that his femoral pulses are normal.  5.  Carotid artery disease: No recent evaluation.  I requested carotid Doppler.   Disposition:   FU with me in 6 months     Signed, 14/02/2018, MD 03/18/21

## 2021-04-08 ENCOUNTER — Other Ambulatory Visit: Payer: Self-pay | Admitting: Cardiovascular Disease

## 2021-04-08 DIAGNOSIS — I63231 Cerebral infarction due to unspecified occlusion or stenosis of right carotid arteries: Secondary | ICD-10-CM

## 2021-04-29 ENCOUNTER — Ambulatory Visit (INDEPENDENT_AMBULATORY_CARE_PROVIDER_SITE_OTHER): Payer: 59

## 2021-04-29 ENCOUNTER — Other Ambulatory Visit: Payer: Self-pay

## 2021-04-29 DIAGNOSIS — I6523 Occlusion and stenosis of bilateral carotid arteries: Secondary | ICD-10-CM

## 2021-05-05 ENCOUNTER — Other Ambulatory Visit: Payer: Self-pay

## 2021-05-05 DIAGNOSIS — I63231 Cerebral infarction due to unspecified occlusion or stenosis of right carotid arteries: Secondary | ICD-10-CM

## 2021-05-31 ENCOUNTER — Other Ambulatory Visit (HOSPITAL_COMMUNITY): Payer: Self-pay | Admitting: Interventional Radiology

## 2021-05-31 DIAGNOSIS — I639 Cerebral infarction, unspecified: Secondary | ICD-10-CM

## 2021-06-14 ENCOUNTER — Other Ambulatory Visit: Payer: Self-pay

## 2021-06-14 ENCOUNTER — Ambulatory Visit (HOSPITAL_COMMUNITY)
Admission: RE | Admit: 2021-06-14 | Discharge: 2021-06-14 | Disposition: A | Discharge status: Home / Self Care | Payer: 59 | Source: Ambulatory Visit | Attending: Interventional Radiology | Admitting: Interventional Radiology

## 2021-06-14 DIAGNOSIS — I639 Cerebral infarction, unspecified: Secondary | ICD-10-CM

## 2021-06-16 ENCOUNTER — Telehealth: Payer: Self-pay | Admitting: Student

## 2021-06-16 NOTE — Telephone Encounter (Signed)
[name] is a patient of a Dr. Corliss Skains, s/p revascularization of occluded right internal carotid terminus and right middle cerebral artery on 10/11/17.   Patient has been follow by [facility] and underwent MR brain wo on 06/14/21.  Imaging reviewed with Dr. Jenkins Rouge, there were some abnormal imaging findings, favored to reflect artifact rather than a recent infarct; however, clinical correlation is recommended.  [name] was called, name and DOB were verified.  [name] states that he is in his normal status of health and has no new symptoms such as headache, dizziness, vision changes, weakness in extremities.   Informed [name] that he will be scheduled for MR brain in 1 year as he has no new symptoms; however, encourage to contact [facility] if he were to develop new symptoms.  [name] verbalized understanding.   Please call [facility] for questions and concerns.   Lynann Bologna Kaemon Barnett PA-C 06/16/2021 9:10 AM

## 2021-07-07 ENCOUNTER — Other Ambulatory Visit: Payer: Self-pay

## 2021-07-07 ENCOUNTER — Ambulatory Visit: Payer: 59 | Admitting: Emergency Medicine

## 2021-07-07 ENCOUNTER — Encounter: Payer: Self-pay | Admitting: Emergency Medicine

## 2021-07-07 VITALS — BP 122/68 | HR 59 | Temp 97.9°F | Ht 71.0 in | Wt 160.0 lb

## 2021-07-07 DIAGNOSIS — Z720 Tobacco use: Secondary | ICD-10-CM

## 2021-07-07 DIAGNOSIS — R0602 Shortness of breath: Secondary | ICD-10-CM

## 2021-07-07 DIAGNOSIS — R9389 Abnormal findings on diagnostic imaging of other specified body structures: Secondary | ICD-10-CM

## 2021-07-07 DIAGNOSIS — R911 Solitary pulmonary nodule: Secondary | ICD-10-CM

## 2021-07-07 NOTE — Patient Instructions (Signed)
We will obtain copies of your imaging from [facility] to review We will perform pulmonary function testing in next office visit We will need to work on decreasing your smoking.  We can talk about strategies to do this at your next visit. Depending on your pulmonary function testing we will decide whether you might benefit from being on daily inhaler medications Follow with Dr. Delton Coombes next available with PFT

## 2021-07-07 NOTE — Progress Notes (Signed)
Subjective:    Patient ID: Raymond Lowery, male    DOB: 06/27/1956, 65 y.o.   MRN: [id number]  HPI 65 year old man with history tobacco use (40 pack years), carotid disease, hyperlipidemia, peripheral vascular disease with CVA, PFO. Presumed COPD / asthma.  Former heavy alcohol use, not currently. Had COVID 08/2020.  Referred today for abnormal CT chest and SOB.   He was working in the heat in July and had an episode of lightheadedness, weakness.  He was evaluated, was covid negative, had an elevated d-dimer so he underwent CT chest at [facility] 05/25/21. Per pt and wife the scan showed some ILD, pulmonary nodules, ? Size. He has albuterol but uses rarely. It does help him. He does have some wheezes. Coughs daily, dry cough, no sputum, no blood.    Review of Systems As per HPI  Past Medical History:  Diagnosis Date   Carotid artery disease (HCC)    s/p L ICA stent 2018    Hyperlipidemia    PAD (peripheral artery disease) (HCC)    Known occluded distal right SFA with moderately reduced ABI   PFO (patent foramen ovale)    Stroke (HCC)    Tobacco use      Family History  Problem Relation Age of Onset   COPD Mother    Dementia Father      Social History   Socioeconomic History   Marital status: Married    Spouse name: Not on file   Number of children: Not on file   Years of education: Not on file   Highest education level: Not on file  Occupational History   Not on file  Tobacco Use   Smoking status: Every Day    Packs/day: 1.00    Years: 40.00    Pack years: 40.00    Types: Cigarettes   Smokeless tobacco: Former   Tobacco comments:    Currently smokes 2 packs a day. ARJ 07/07/21  Vaping Use   Vaping Use: Never used  Substance and Sexual Activity   Alcohol use: Yes   Drug use: No   Sexual activity: Yes  Other Topics Concern   Not on file  Social History Narrative   Not on file   Social Determinants of Health   Financial Resource Strain: Not on file  Food  Insecurity: Not on file  Transportation Needs: Not on file  Physical Activity: Not on file  Stress: Not on file  Social Connections: Not on file  Intimate Partner Violence: Not on file    He is a Psychologist, occupational, has also done done sandblasting.  Has lived in [place], [place], [place], [employer] No military.    No Known Allergies   Outpatient Medications Prior to Visit  Medication Sig Dispense Refill   albuterol (VENTOLIN HFA) 108 (90 Base) MCG/ACT inhaler SMARTSIG:2 Puff(s) By Mouth Every 6 Hours PRN     aspirin 81 MG chewable tablet Chew by mouth daily.     atorvastatin (LIPITOR) 40 MG tablet TAKE 1 TABLET(40 MG) BY MOUTH DAILY AT 6 PM 90 tablet 2   No facility-administered medications prior to visit.         Objective:   Physical Exam Vitals:   07/07/21 1554  BP: 122/68  Pulse: (!) 59  Temp: 97.9 F (36.6 C)  TempSrc: Oral  SpO2: 97%  Weight: 160 lb (72.6 kg)  Height: 5\' 11"  (1.803 m)    Gen: Pleasant, well-nourished, in no distress,  normal affect  ENT: No lesions,  mouth clear,  oropharynx clear, no postnasal drip  Neck: No JVD, no stridor  Lungs: No use of accessory muscles, no crackles or wheezing on normal respiration, no wheeze on forced expiration  Cardiovascular: RRR, heart sounds normal, no murmur or gallops, no peripheral edema  Musculoskeletal: No deformities, no cyanosis or clubbing  Neuro: alert, awake, non focal  Skin: Warm, no lesions or rash      Assessment & Plan:   Tobacco abuse Discussed cessation with him today.  We will need to work hard on cutting down.  We will readdress going forward  Exertional shortness of breath Initially noted in July when he was working hard in the heat, had dyspnea, lightheadedness, weakness.  Evaluation included a CT chest that did not show any evidence of pulmonary embolism.  He is at risk for COPD given his heavy tobacco use.  He has albuterol but rarely uses it.  I think he needs pulmonary function testing now to define his degree  of obstruction.  He may benefit from scheduled bronchodilator therapy depending on results.  Abnormal CT of the chest WhenCT chest was performed 05/25/2021 he had an episode of acute dyspnea lightheadedness and weakness.  It did not show a pulmonary embolism, per his wife there may have been some interstitial lung disease, pulmonary nodular disease.  No other details known at this time.  I need to get a copy of his CT scan from [facility] so that we can review next time, plan any intervention or repeat imaging  Levy Pupa, MD, PhD 07/14/2021, 3:49 PM [facility] [phone] or if no answer before 7:00PM call [phone] For any issues after 7:00PM please call eLink [phone]

## 2021-07-12 ENCOUNTER — Other Ambulatory Visit: Payer: Self-pay

## 2021-07-12 ENCOUNTER — Ambulatory Visit (INDEPENDENT_AMBULATORY_CARE_PROVIDER_SITE_OTHER): Payer: 59 | Admitting: Emergency Medicine

## 2021-07-12 DIAGNOSIS — R911 Solitary pulmonary nodule: Secondary | ICD-10-CM

## 2021-07-12 LAB — PULMONARY FUNCTION TEST
DL/VA % pred: 63 %
DL/VA: 2.64 ml/min/mmHg/L
DLCO cor % pred: 55 %
DLCO cor: 15.33 ml/min/mmHg
DLCO unc % pred: 55 %
DLCO unc: 15.33 ml/min/mmHg
FEF 25-75 Post: 1.32 L/sec
FEF 25-75 Pre: 1.31 L/sec
FEF2575-%Change-Post: 1 %
FEF2575-%Pred-Post: 46 %
FEF2575-%Pred-Pre: 45 %
FEV1-%Change-Post: 0 %
FEV1-%Pred-Post: 64 %
FEV1-%Pred-Pre: 64 %
FEV1-Post: 2.32 L
FEV1-Pre: 2.32 L
FEV1FVC-%Change-Post: -1 %
FEV1FVC-%Pred-Pre: 91 %
FEV6-%Change-Post: 1 %
FEV6-%Pred-Post: 73 %
FEV6-%Pred-Pre: 72 %
FEV6-Post: 3.38 L
FEV6-Pre: 3.33 L
FEV6FVC-%Change-Post: 0 %
FEV6FVC-%Pred-Post: 103 %
FEV6FVC-%Pred-Pre: 103 %
FVC-%Change-Post: 1 %
FVC-%Pred-Post: 71 %
FVC-%Pred-Pre: 70 %
FVC-Post: 3.44 L
FVC-Pre: 3.4 L
Post FEV1/FVC ratio: 67 %
Post FEV6/FVC ratio: 98 %
Pre FEV1/FVC ratio: 68 %
Pre FEV6/FVC Ratio: 98 %
RV % pred: 158 %
RV: 3.77 L
TLC % pred: 97 %
TLC: 7.05 L

## 2021-07-12 NOTE — Progress Notes (Signed)
PFT done today. 

## 2021-07-14 ENCOUNTER — Encounter: Payer: Self-pay | Admitting: Emergency Medicine

## 2021-07-14 ENCOUNTER — Telehealth: Payer: Self-pay

## 2021-07-14 DIAGNOSIS — R9389 Abnormal findings on diagnostic imaging of other specified body structures: Secondary | ICD-10-CM | POA: Insufficient documentation

## 2021-07-14 DIAGNOSIS — R0602 Shortness of breath: Secondary | ICD-10-CM | POA: Insufficient documentation

## 2021-07-14 NOTE — Telephone Encounter (Signed)
CTA, CT Abd/Pelvis and US testicle disk received from [facility] and placed in Dr. Kavin Leech review folder.

## 2021-07-14 NOTE — Assessment & Plan Note (Signed)
Initially noted in July when he was working hard in the heat, had dyspnea, lightheadedness, weakness.  Evaluation included a CT chest that did not show any evidence of pulmonary embolism.  He is at risk for COPD given his heavy tobacco use.  He has albuterol but rarely uses it.  I think he needs pulmonary function testing now to define his degree of obstruction.  He may benefit from scheduled bronchodilator therapy depending on results.

## 2021-07-14 NOTE — Assessment & Plan Note (Signed)
WhenCT chest was performed 05/25/2021 he had an episode of acute dyspnea lightheadedness and weakness.  It did not show a pulmonary embolism, per his wife there may have been some interstitial lung disease, pulmonary nodular disease.  No other details known at this time.  I need to get a copy of his CT scan from [facility] so that we can review next time, plan any intervention or repeat imaging

## 2021-07-14 NOTE — Assessment & Plan Note (Signed)
Discussed cessation with him today.  We will need to work hard on cutting down.  We will readdress going forward

## 2021-08-10 ENCOUNTER — Other Ambulatory Visit: Payer: Self-pay

## 2021-08-10 ENCOUNTER — Encounter: Payer: Self-pay | Admitting: Emergency Medicine

## 2021-08-10 ENCOUNTER — Ambulatory Visit: Payer: 59 | Admitting: Emergency Medicine

## 2021-08-10 DIAGNOSIS — Z720 Tobacco use: Secondary | ICD-10-CM

## 2021-08-10 DIAGNOSIS — J449 Chronic obstructive pulmonary disease, unspecified: Secondary | ICD-10-CM | POA: Insufficient documentation

## 2021-08-10 DIAGNOSIS — R9389 Abnormal findings on diagnostic imaging of other specified body structures: Secondary | ICD-10-CM

## 2021-08-10 MED ORDER — ANORO ELLIPTA 62.5-25 MCG/INH IN AEPB: 1.0000 | INHALATION_SPRAY | Freq: Every day | RESPIRATORY_TRACT | 0 refills | Status: DC

## 2021-08-10 NOTE — Assessment & Plan Note (Signed)
Moderately severe obstructive disease confirmed on pulmonary function testing.  He uses intermittent albuterol.  Symptomatic daily with mucus production and dyspnea.  I would like to start him on scheduled BD therapy.  His insurance covers Anoro and we will start this, see if he benefits.  If so he will call us and we will send a prescription to his pharmacy.  He will keep albuterol available to use if needed.  Biggest issue here is that he needs stop smoking.

## 2021-08-10 NOTE — Patient Instructions (Signed)
We reviewed the CT scan of the chest today.  This shows stable small pulmonary nodules (benign) as well as some thickening of the pleural lining around the left lung with some calcification.  We will need to follow the pleural thickening intermittently with either chest x-ray or CT scan of the chest to ensure that it is not changing over time.  We can plan the timing at your next visit. Try starting Anoro 1 inhalation once daily.  Take it reliably every day.  Keep track of whether it helps your breathing.  If so please call us so that we can send a prescription to your pharmacy and continue. Keep albuterol available to use 2 puffs up to every 4 hours if needed for shortness of breath, chest tightness, wheezing.  You need to work on decreasing your smoking.  This is significantly harming your lungs and your breathing.  This also puts you at higher risk for lung cancer and heart disease Follow with Dr Delton Coombes in 6 months or sooner if you have any problems

## 2021-08-10 NOTE — Assessment & Plan Note (Signed)
Reviewed his CT from 05/25/2021 and it was a comparison film going back to 2014.  He has some punctate right-sided nodules that have been stable and which are benign.  Also with some left sided pleural thickening with calcification.  Etiology for this is unclear although he has had multiple traumas to the chest over the years.  Consider also asbestos and asbestos exposure.  This will need to be followed given the risk for asbestos involvement.  Would plan to follow by chest x-ray and intermittently CT chest.  We can discuss the timing going forward.

## 2021-08-10 NOTE — Assessment & Plan Note (Signed)
Heavy tobacco use.  He needs to cut down and then we can start to consider whether he wants to set a quit date.

## 2021-08-10 NOTE — Progress Notes (Signed)
Subjective:    Patient ID: Raymond Lowery, male    DOB: 28-Mar-1956, 65 y.o.   MRN: [id number]  HPI 65 year old man with history tobacco use (40 pack years), carotid disease, hyperlipidemia, peripheral vascular disease with CVA, PFO. Presumed COPD / asthma.  Former heavy alcohol use, not currently. Had COVID 08/2020.  Referred today for abnormal CT chest and SOB.   He was working in the heat in July and had an episode of lightheadedness, weakness.  He was evaluated, was covid negative, had an elevated d-dimer so he underwent CT chest at [facility] 05/25/21. Per pt and wife the scan showed some ILD, pulmonary nodules, ? Size. He has albuterol but uses rarely. It does help him. He does have some wheezes. Coughs daily, dry cough, no sputum, no blood.   ROV 08/10/21 --follow-up visit for 65 year old man with a history of tobacco use and presumed COPD.  Continues to smoke 2 packs daily.  Also with a history of pulmonary nodular disease based on CT scan of the chest performed at [facility].  He had PFT as below.  He has been experiencing more cough and congestion for the last week. Clear mucous. Exertional SOB unchanged.   CT scan of the chest done on 05/25/2021 and reviewed by me showed mild left pleural thickening with some focal calcification, small punctate right-sided pulmonary nodules.  Both are stable compared with a CT chest that was done on 02/14/2013.  Function testing performed on 07/12/2021 reviewed by me, shows moderately severe obstruction without a bronchodilator response, hyperinflated lung volumes, decreased diffusion capacity.   Review of Systems As per HPI      Objective:   Physical Exam Vitals:   08/10/21 1634  BP: 124/68  Pulse: 64  Temp: 98.1 F (36.7 C)  TempSrc: Oral  SpO2: 97%  Weight: 164 lb (74.4 kg)  Height: 5\' 11"  (1.803 m)    Gen: Pleasant, well-nourished, in no distress,  normal affect  ENT: No lesions,  mouth clear,  oropharynx clear, no postnasal  drip  Neck: No JVD, no stridor  Lungs: No use of accessory muscles, no crackles or wheezing on normal respiration, no wheeze on forced expiration  Cardiovascular: RRR, heart sounds normal, no murmur or gallops, no peripheral edema  Musculoskeletal: No deformities, no cyanosis or clubbing  Neuro: alert, awake, non focal  Skin: Warm, no lesions or rash      Assessment & Plan:   Abnormal CT of the chest Reviewed his CT from 05/25/2021 and it was a comparison film going back to 2014.  He has some punctate right-sided nodules that have been stable and which are benign.  Also with some left sided pleural thickening with calcification.  Etiology for this is unclear although he has had multiple traumas to the chest over the years.  Consider also asbestos and asbestos exposure.  This will need to be followed given the risk for asbestos involvement.  Would plan to follow by chest x-ray and intermittently CT chest.  We can discuss the timing going forward.  COPD (chronic obstructive pulmonary disease) (HCC) Moderately severe obstructive disease confirmed on pulmonary function testing.  He uses intermittent albuterol.  Symptomatic daily with mucus production and dyspnea.  I would like to start him on scheduled BD therapy.  His insurance covers Anoro and we will start this, see if he benefits.  If so he will call 05/27/2021 and we will send a prescription to his pharmacy.  He will keep albuterol available to use  if needed.  Biggest issue here is that he needs stop smoking.  Tobacco abuse Heavy tobacco use.  He needs to cut down and then we can start to consider whether he wants to set a quit date.  Levy Pupa, MD, PhD 08/10/2021, 5:34 PM [facility] [phone] or if no answer before 7:00PM call [phone] For any issues after 7:00PM please call eLink [phone]

## 2021-09-28 ENCOUNTER — Ambulatory Visit
Admission: RE | Admit: 2021-09-28 | Discharge: 2021-09-28 | Disposition: A | Discharge status: Home / Self Care | Payer: Self-pay | Source: Ambulatory Visit | Attending: Emergency Medicine | Admitting: Emergency Medicine

## 2021-09-28 ENCOUNTER — Other Ambulatory Visit: Payer: Self-pay

## 2021-09-28 DIAGNOSIS — R911 Solitary pulmonary nodule: Secondary | ICD-10-CM

## 2021-09-28 NOTE — Progress Notes (Signed)
Chest CT from [facility]

## 2022-04-15 ENCOUNTER — Ambulatory Visit: Payer: 59 | Admitting: Medical

## 2022-04-15 ENCOUNTER — Encounter: Payer: Self-pay | Admitting: Medical

## 2022-04-15 VITALS — BP 116/66 | HR 68 | Ht 71.0 in | Wt 157.0 lb

## 2022-04-15 DIAGNOSIS — E782 Mixed hyperlipidemia: Secondary | ICD-10-CM

## 2022-04-15 DIAGNOSIS — I739 Peripheral vascular disease, unspecified: Secondary | ICD-10-CM

## 2022-04-15 DIAGNOSIS — I6523 Occlusion and stenosis of bilateral carotid arteries: Secondary | ICD-10-CM

## 2022-04-15 DIAGNOSIS — Z720 Tobacco use: Secondary | ICD-10-CM | POA: Diagnosis not present

## 2022-04-15 DIAGNOSIS — J449 Chronic obstructive pulmonary disease, unspecified: Secondary | ICD-10-CM

## 2022-04-15 NOTE — Progress Notes (Signed)
Cardiology Office Note:    Date:  04/15/2022   ID:  Raymond Lowery, DOB [birth year], MRN [id number]  PCP:  Exie Parody  [facility] Cardiologist:  [name], MD  [facility] Electrophysiologist:  None   Referring MD: Rick Duff, PA-C   Chief Complaint: 6 month follow-up  History of Present Illness:    Raymond Lowery is a 66 y.o. male with a hx of PAD, HTN, HLD, h/o alcohol abuse, tobacco use who presents for 6 month follow-up.   She was hospitalized in December 2018 for right MCA stroke due to [facility] occlusion from suspected embolism from proximal Right ICA with stenosis. He was treated successfully with mechanical embolectomy and rescue ICA stenting. Echo showed normal LVEF with no significant valvular abnormalities. TEE showed PFO with R>L shunt with valsalva. PFO Was not felt to be the source of stroke.   He has known occluded right distal SFA with moderately reduced ABI.   Last seen 02/2021 and was overall stable with minimal claudication. Carotid US was ordered.  Today, the patient reports stable claudication. He has good and bad days. He reports burning on top of his feet, sounds like neuropathy. He says he will eventually need PAD intervention, but is still wanting to wait. No chest pain, shortness of breath. He smokes, but wants to quit. He smokes 1 ppd. He is still working full time on cars. He is taking Aspirin daily. He was diagnosed with moderate to severe COPD, but dose not feel it's an accurate diagnosis.   Past Medical History:  Diagnosis Date   Carotid artery disease (HCC)    s/p L ICA stent 2018    Hyperlipidemia    PAD (peripheral artery disease) (HCC)    Known occluded distal right SFA with moderately reduced ABI   PFO (patent foramen ovale)    Stroke (HCC)    Tobacco use     Past Surgical History:  Procedure Laterality Date   BACK SURGERY     IR ANGIO EXTRACRAN SEL COM CAROTID INNOMINATE UNI L MOD SED  10/09/2017   IR  ANGIO VERTEBRAL SEL SUBCLAVIAN INNOMINATE UNI R MOD SED  10/09/2017   IR INTRAVSC STENT CERV CAROTID W/O EMB-PROT MOD SED INC ANGIO  10/09/2017   IR PERCUTANEOUS ART THROMBECTOMY/INFUSION INTRACRANIAL INC DIAG ANGIO  10/09/2017   IR RADIOLOGIST EVAL & MGMT  11/06/2017   JOINT REPLACEMENT     RADIOLOGY WITH ANESTHESIA N/A 10/09/2017   Procedure: RADIOLOGY WITH ANESTHESIA;  Surgeon: Radiologist, Medication, MD;  Location: [facility] OR;  Service: Radiology;  Laterality: N/A;   TEE WITHOUT CARDIOVERSION N/A 01/26/2018   Procedure: TRANSESOPHAGEAL ECHOCARDIOGRAM (TEE);  Surgeon: Yates Decamp, MD;  Location: [facility] ENDOSCOPY;  Service: Cardiovascular;  Laterality: N/A;    Current Medications: Current Meds  Medication Sig   albuterol (VENTOLIN HFA) 108 (90 Base) MCG/ACT inhaler SMARTSIG:2 Puff(s) By Mouth Every 6 Hours PRN   aspirin 81 MG chewable tablet Chew by mouth daily.   atorvastatin (LIPITOR) 40 MG tablet TAKE 1 TABLET(40 MG) BY MOUTH DAILY AT 6 PM     Allergies:   Patient has no known allergies.   Social History   Socioeconomic History   Marital status: Married    Spouse name: Not on file   Number of children: Not on file   Years of education: Not on file   Highest education level: Not on file  Occupational History   Not on file  Tobacco Use   Smoking status:  Every Day    Packs/day: 1.00    Years: 40.00    Total pack years: 40.00    Types: Cigarettes   Smokeless tobacco: Former   Tobacco comments:    Currently smokes 2 packs a day. ARJ 08/10/20  Vaping Use   Vaping Use: Never used  Substance and Sexual Activity   Alcohol use: Yes   Drug use: No   Sexual activity: Yes  Other Topics Concern   Not on file  Social History Narrative   Not on file   Social Determinants of Health   Financial Resource Strain: Not on file  Food Insecurity: Not on file  Transportation Needs: Not on file  Physical Activity: Not on file  Stress: Not on file  Social Connections: Not on file      Family History: The patient's family history includes COPD in his mother; Dementia in his father.  ROS:   Please see the history of present illness.     All other systems reviewed and are negative.  EKGs/Labs/Other Studies Reviewed:    The following studies were reviewed today:  Echo TEE 12/2017 Study Conclusions   - Left ventricle: The cavity size was normal. Wall thickness was    normal. Systolic function was normal. The estimated ejection    fraction was in the range of 55% to 60%.  - Aorta: The aorta was mildly abnormal and mildly calcified. There    was mild, fixed, non-ulcerated atheromatous plaque in the aortic    arch.  - Left atrium: No evidence of thrombus in the atrial cavity or    appendage. No evidence of thrombus in the appendage.  - Right atrium: No evidence of thrombus in the atrial cavity or    appendage.  - Atrial septum: There was a medium-sized patent foramen ovale.    Agitated saline contrast study showed a moderate right-to-left    shunt, following an increase in RA pressure induced by the    Valsalva maneuver. Agitated saline contrast study showed a    moderate right-to-left shunt, following an increase in RA    pressure induced by abdominal compression.  - Mild aortic atheroma of the aortic arch and presence of moderate    sized PFO could be the etiology for his stroke.   Impressions:   - PFO and also aortic arch atherosclerosis could both be potential    source of cerebral embolism. Aortic atheroma is mild at most    without complex plaques. Clinical correlation recommended.  Echo 09/2017 Study Conclusions   - Left ventricle: The cavity size was normal. Wall thickness was    normal. Systolic function was normal. The estimated ejection    fraction was in the range of 55% to 60%. Wall motion was normal;    there were no regional wall motion abnormalities. Left    ventricular diastolic function parameters were normal.   Impressions:   -  Normal study.   EKG:  EKG is ordered today.  The ekg ordered today demonstrates NSR 68pm, nonspecific ST/T wave changes  Recent Labs: No results found for requested labs within last 365 days.  Recent Lipid Panel    Component Value Date/Time   CHOL 108 01/14/2020 0853   TRIG 68 01/14/2020 0853   HDL 43 01/14/2020 0853   CHOLHDL 2.5 01/14/2020 0853   CHOLHDL 2.8 04/25/2018 1550   VLDL 25 10/10/2017 0337   LDLCALC 50 01/14/2020 0853   LDLCALC 43 04/25/2018 1550   Physical Exam:  VS:  BP 116/66 (BP Location: Left Arm, Patient Position: Sitting, Cuff Size: Normal)   Pulse 68   Ht 5\' 11"  (1.803 m)   Wt 157 lb (71.2 kg)   BMI 21.90 kg/m     Wt Readings from Last 3 Encounters:  04/15/22 157 lb (71.2 kg)  08/10/21 164 lb (74.4 kg)  07/07/21 160 lb (72.6 kg)     GEN:  Well nourished, well developed in no acute distress HEENT: Normal NECK: No JVD; No carotid bruits LYMPHATICS: No lymphadenopathy CARDIAC: RRR, no murmurs, rubs, gallops RESPIRATORY:  +rhonchi  ABDOMEN: Soft, non-tender, non-distended MUSCULOSKELETAL:  No edema; No deformity  SKIN: Warm and dry NEUROLOGIC:  Alert and oriented x 3 PSYCHIATRIC:  Normal affect   ASSESSMENT:    1. Peripheral artery disease ([place])   2. PAD (peripheral artery disease) ([place])   3. Tobacco use   4. Hyperlipidemia, mixed   5. Bilateral carotid artery stenosis   6. Chronic obstructive pulmonary disease, unspecified COPD type ([place])    PLAN:    In order of problems listed above:  PAD Patient denies worsening claudication symptoms. He has known occluded distal right SFA. He is trying to cut down on tobacco. Continue Aspirin and statin therapy.   Tobacco use /COPD Patient with moderate to severe COPD. He continues to smoke 1 ppd. He follows with pulmonology. Chest CT showed lung nodules, plan to be followed with intermittent imaging. Complete cessation advised.   HLD LDL 49 10/2021. Continue statin therapy.   Carotid artery  disease US carotids showed 1-39% bilaterally. Continue Aspirin and statin therapy.   Disposition: Follow up in 6 month(s) with MD     Signed, Donetta Isaza Ninfa Meeker, PA-C  04/15/2022 4:23 PM    [facility]

## 2022-04-15 NOTE — Patient Instructions (Signed)
Medication Instructions:  Your physician recommends that you continue on your current medications as directed. Please refer to the Current Medication list given to you today.  *If you need a refill on your cardiac medications before your next appointment, please call your pharmacy*   Lab Work: None ordered If you have labs (blood work) drawn today and your tests are completely normal, you will receive your results only by: MyChart Message (if you have MyChart) OR A paper copy in the mail If you have any lab test that is abnormal or we need to change your treatment, we will call you to review the results.   Testing/Procedures: None ordered   Follow-Up: At [facility], you and your health needs are our priority.  As part of our continuing mission to provide you with exceptional heart care, we have created designated Provider Care Teams.  These Care Teams include your primary Cardiologist (physician) and Advanced Practice Providers (APPs -  Physician Assistants and Nurse Practitioners) who all work together to provide you with the care you need, when you need it.  We recommend signing up for the patient portal called "MyChart".  Sign up information is provided on this After Visit Summary.  MyChart is used to connect with patients for Virtual Visits (Telemedicine).  Patients are able to view lab/test results, encounter notes, upcoming appointments, etc.  Non-urgent messages can be sent to your provider as well.   To learn more about what you can do with MyChart, go to [email or web address].    Your next appointment:   6 month(s)  The format for your next appointment:   In Person  Provider:   Muhammad Arida, MD   Other Instructions N/A  Important Information About Sugar       

## 2022-05-27 IMAGING — MR MR HEAD W/O CM
7 of 13 series · 19 of 48 positions shown · non-contrast
Comparison: MRI brain and MRA head 10/10/2017.

CLINICAL DATA: Cerebrovascular accident (CVA), unspecified
mechanism (HCC) [id number] ([id number]).

EXAM:
MRI HEAD WITHOUT CONTRAST
MRA HEAD WITHOUT CONTRAST
TECHNIQUE: Multiplanar, multi-echo pulse sequences of the brain and surrounding
structures were acquired without intravenous contrast. Angiographic
images of the Circle of Willis were acquired using MRA technique
without intravenous contrast.

[Series 2: DWI · axial · 3.0mm · 0.94mm/px · z∈[-95,+58]mm · 6 of 105 slices shown (1 of 2)]
[im 1/105]
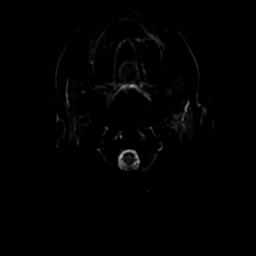
[im 21/105]
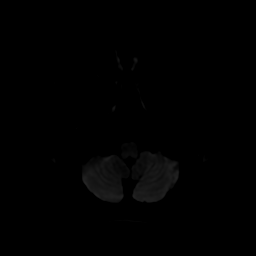
[im 42/105]
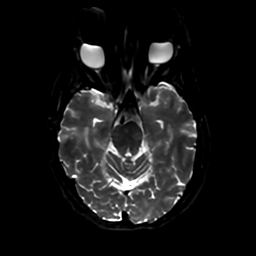
[im 63/105]
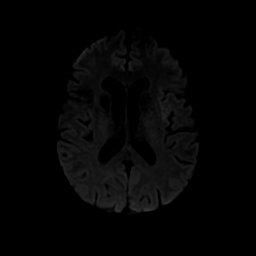
[im 84/105]
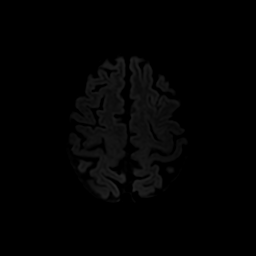
[im 105/105]
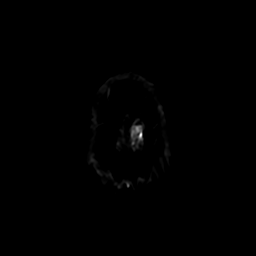

[Series 4: DWI · coronal · 4.0mm · 0.94mm/px · 4 of 73 slices shown (2 of 2)]
[im 1/73]
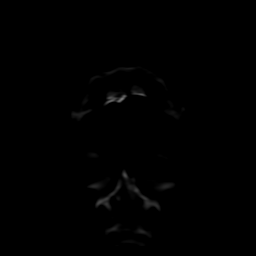
[im 25/73]
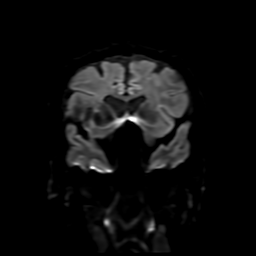
[im 49/73]
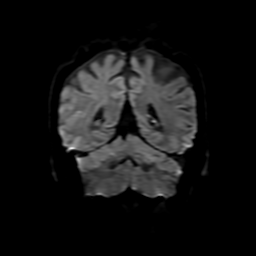
[im 73/73]
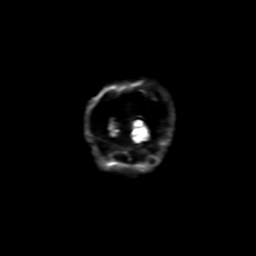

[Series 5: FLAIR · sagittal · 5.0mm · 0.23mm/px · 1 of 25 slices shown (1 of 2)]
[im 1/25]
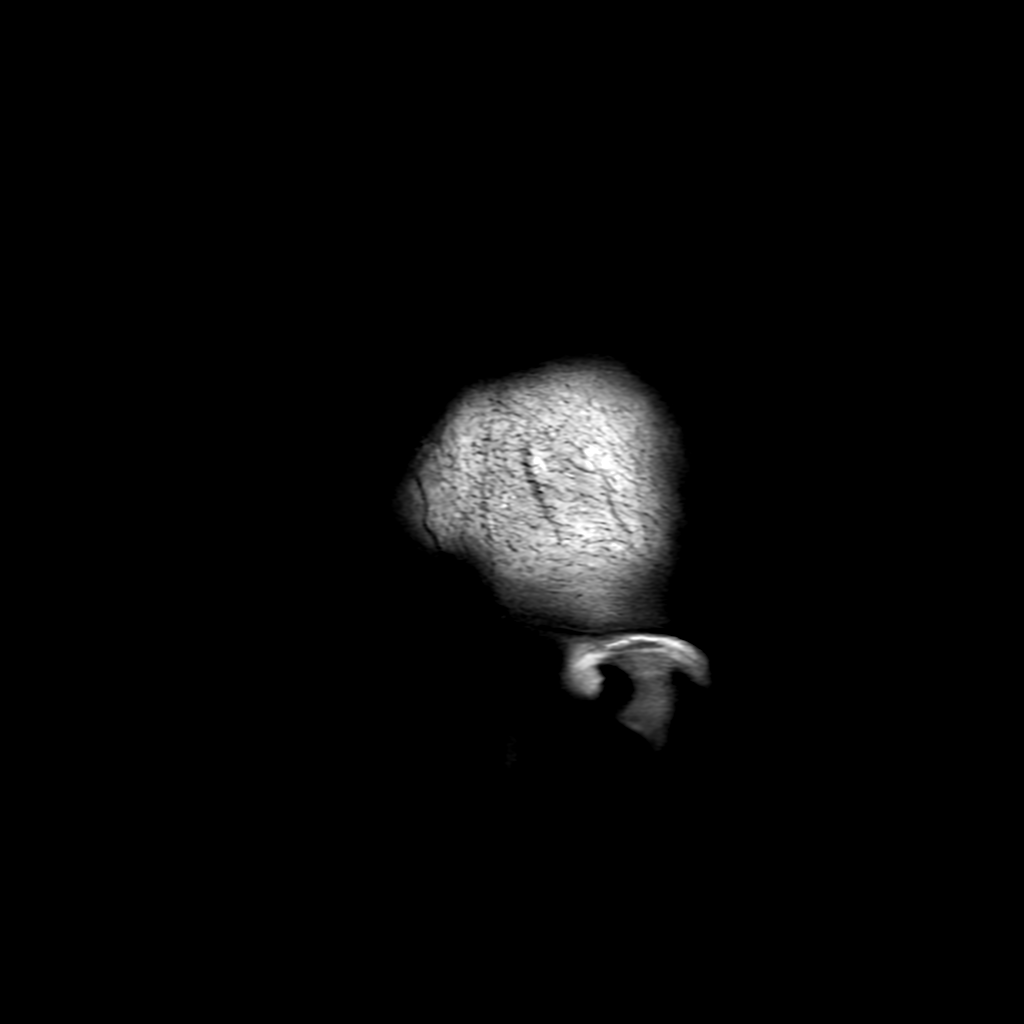

[Series 6: T2 · axial · 5.0mm · 0.23mm/px · 1 of 27 slices shown]
[im 1/27]
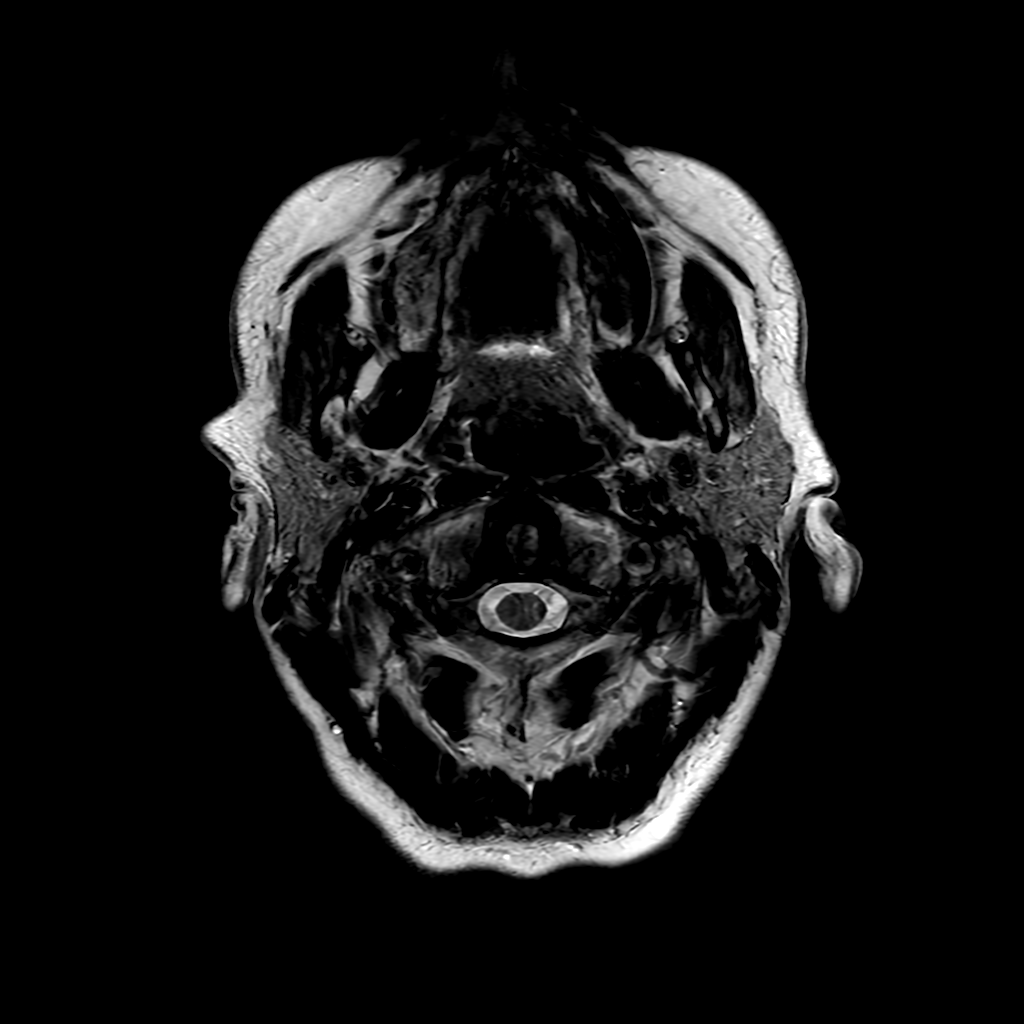

[Series 7: FLAIR · axial · 4.0mm · 0.45mm/px · z∈[-84,+71]mm · 2 of 37 slices shown (2 of 2)]
[im 1/37]
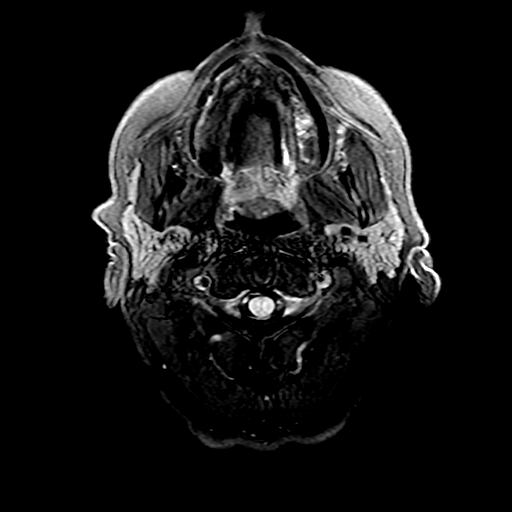
[im 37/37]
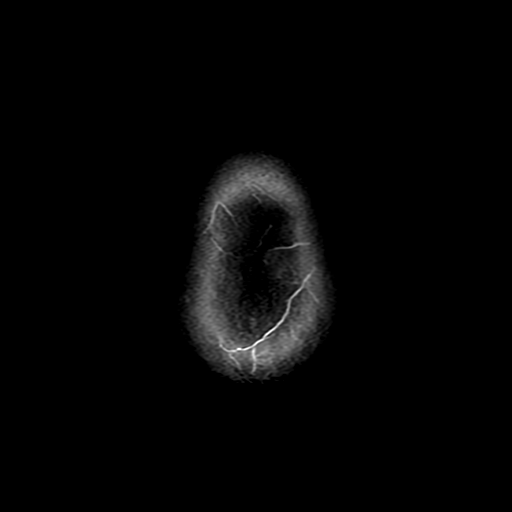

[Series 250: ADC · axial · 3.0mm · 0.94mm/px · z∈[-95,+58]mm · 3 of 54 slices shown (1 of 2)]
[im 1/54]
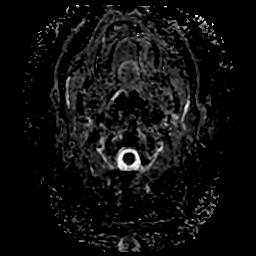
[im 27/54]
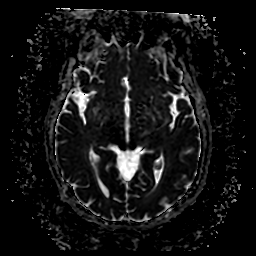
[im 54/54]
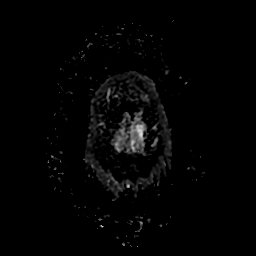

[Series 450: ADC · coronal · 4.0mm · 0.94mm/px · 2 of 34 slices shown (2 of 2)]
[im 1/34]
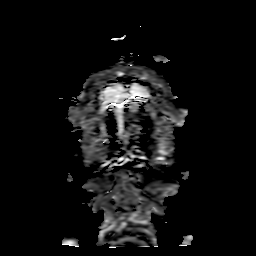
[im 34/34]
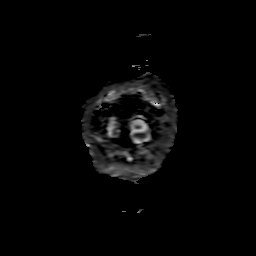

[19 of 48 positions shown; findings below may reference images not displayed]

FINDINGS: MRI HEAD FINDINGS

Brain:

Mild generalized cerebral atrophy.

Apparent 7 mm focus of diffusion weighted signal abnormality within
the left aspect of the pons, appreciated on the coronal
diffusion-weighted sequence only and favored artifactual (series 4,
images 17 and 18). No signal abnormality is appreciated at this site
on the axial diffusion-weighted sequence. Additionally, there is no
corresponding signal abnormality at this site on the remaining
sequences.

Known chronic cortically-based infarcts within the right frontal
operculum and right insula. Known chronic infarcts within the right
basal ganglia. These infarcts were acute at time of the prior brain
MRI on 10/10/2017. Chronic hemosiderin deposition now present at
these sites.

Redemonstrated small chronic cortical infarcts within the left
frontal lobe 20 6 by 4).

Background mild multifocal T2/FLAIR hyperintensity within the
cerebral white matter, nonspecific but compatible with chronic small
vessel ischemic disease.

No evidence of an intracranial mass.

No extra-axial fluid collection.

No midline shift.

Vascular: Maintained flow voids within the proximal large arterial
vessels.

Skull and upper cervical spine: No focal marrow lesion.

Sinuses/Orbits: Visualized orbits show no acute finding. As before,
the right maxillary sinus is asymmetrically small with associated
chronic reactive osteitis. There is extensive partial T2
hyperintense opacification of the right maxillary sinus secondary to
the presence of mucosal thickening, and possibly fluid. Trace
mucosal thickening within the left frontal and bilateral ethmoid air
cells.

MRA HEAD FINDINGS

Anterior circulation:

The intracranial internal carotid arteries are patent. The M1 middle
cerebral arteries are patent. No M2 proximal branch occlusion or
high-grade proximal stenosis is identified. The anterior cerebral
arteries are patent. 1-2 mm in flea projecting vascular protrusion
arising from the paraclinoid right ICA, which may reflect an
aneurysm or infundibulum (series 802, image 237). (Series 3, image
98).

Posterior circulation:

The intracranial vertebral arteries are patent. The basilar artery
is patent. The posterior cerebral arteries are patent. Posterior
communicating arteries are hypoplastic or absent bilaterally.

Anatomic variants: As described.

MRI brain impression #1 will be called to the ordering clinician or
representative by the Radiologist Assistant, and communication
documented in the PACS or [REDACTED].
IMPRESSION: MRI brain:

1. Apparent 7 mm focus of diffusion weighted-signal abnormality
within the left pons, appreciated on the coronal diffusion-weighted
sequence only and favored to reflect artifact rather than a recent
infarct. However, clinical correlation is recommended.
2. Known chronic infarcts within the right frontal operculum, right
insula and right basal ganglia (these infarcts were acute at time of
the prior brain MRI on 10/10/2017). Associated chronic hemosiderin
deposition now present at these sites.
3. Redemonstrated small chronic cortical infarcts within the left
frontal lobe.
4. Mild chronic small vessel ischemic changes within the cerebral
white matter, stable.
5. Mild generalized cerebral atrophy, stable.
6. Paranasal sinus disease. Most notably, there is an asymmetrically
diminutive right maxillary sinus with chronic severe right maxillary
sinusitis.

MRA head:

1. No intracranial large vessel occlusion or proximal high-grade
arterial stenosis.
2. 1-2 mm inferiorly projecting vascular protrusion arising from the
paraclinoid right ICA, which may reflect an aneurysm or
infundibulum.

## 2022-07-06 ENCOUNTER — Other Ambulatory Visit (HOSPITAL_COMMUNITY): Payer: Self-pay | Admitting: Interventional Radiology

## 2022-07-06 DIAGNOSIS — I639 Cerebral infarction, unspecified: Secondary | ICD-10-CM

## 2022-07-06 DIAGNOSIS — I671 Cerebral aneurysm, nonruptured: Secondary | ICD-10-CM

## 2022-08-03 ENCOUNTER — Ambulatory Visit (HOSPITAL_COMMUNITY)
Admission: RE | Admit: 2022-08-03 | Discharge: 2022-08-03 | Disposition: A | Discharge status: Home / Self Care | Payer: 59 | Source: Ambulatory Visit | Attending: Interventional Radiology | Admitting: Interventional Radiology

## 2022-08-03 DIAGNOSIS — I671 Cerebral aneurysm, nonruptured: Secondary | ICD-10-CM | POA: Diagnosis present

## 2022-08-03 DIAGNOSIS — I639 Cerebral infarction, unspecified: Secondary | ICD-10-CM | POA: Insufficient documentation

## 2022-08-10 ENCOUNTER — Telehealth (HOSPITAL_COMMUNITY): Payer: Self-pay

## 2022-08-10 NOTE — Telephone Encounter (Signed)
Pt agreed to f/u in 6 months with us carotid. AW 

## 2022-08-11 ENCOUNTER — Telehealth (HOSPITAL_COMMUNITY): Payer: Self-pay

## 2022-08-11 NOTE — Telephone Encounter (Signed)
Pt had questions regarding his recent scan. I have sent a message to our PA to call and discuss. AW

## 2022-08-22 ENCOUNTER — Telehealth: Payer: Self-pay | Admitting: Student

## 2022-08-22 NOTE — Progress Notes (Signed)
Patient ID: Raymond Lowery, male   DOB: 1956/07/08, 66 y.o.   MRN: [id number] Patient's wife, Carles Florea, called to discuss a question from Mr Sudol's recent MR Brain wo Contrast on 08/03/22. The question was regarding the finding of a 26mm lesion on the left parotid gland. I discussed this with Dr Estanislado Pandy, and it was recommended that the patient follow-up with their primary care provider regarding this finding. Lura Em, PA-C 08/22/2022 2:36 PM

## 2022-09-18 NOTE — Progress Notes (Unsigned)
Cardiology Clinic Note   Patient Name: Raymond Lowery Date of Encounter: 09/19/2022  Primary Care Provider:  Medicine, [facility] Cardiologist:  [name], MD  Patient Profile    66 year old male with a history of peripheral arterial disease, hypertension, hyperlipidemia, history of alcohol abuse, tobacco abuse who presents today for 64-month follow-up.  Past Medical History    Past Medical History:  Diagnosis Date   Carotid artery disease (HCC)    s/p L ICA stent 2018    Hyperlipidemia    PAD (peripheral artery disease) (HCC)    Known occluded distal right SFA with moderately reduced ABI   PFO (patent foramen ovale)    Stroke (HCC)    Tobacco use    Past Surgical History:  Procedure Laterality Date   BACK SURGERY     IR ANGIO EXTRACRAN SEL COM CAROTID INNOMINATE UNI L MOD SED  10/09/2017   IR ANGIO VERTEBRAL SEL SUBCLAVIAN INNOMINATE UNI R MOD SED  10/09/2017   IR INTRAVSC STENT CERV CAROTID W/O EMB-PROT MOD SED INC ANGIO  10/09/2017   IR PERCUTANEOUS ART THROMBECTOMY/INFUSION INTRACRANIAL INC DIAG ANGIO  10/09/2017   IR RADIOLOGIST EVAL & MGMT  11/06/2017   JOINT REPLACEMENT     RADIOLOGY WITH ANESTHESIA N/A 10/09/2017   Procedure: RADIOLOGY WITH ANESTHESIA;  Surgeon: Radiologist, Medication, MD;  Location: [facility] OR;  Service: Radiology;  Laterality: N/A;   TEE WITHOUT CARDIOVERSION N/A 01/26/2018   Procedure: TRANSESOPHAGEAL ECHOCARDIOGRAM (TEE);  Surgeon: Yates Decamp, MD;  Location: [facility] ENDOSCOPY;  Service: Cardiovascular;  Laterality: N/A;    Allergies  No Known Allergies  History of Present Illness    Raymond Lowery is a 66 year old male with dementia and history of peripheral arterial disease, hypertension, hyperlipidemia history of alcohol abuse history of tobacco use.  He was hospitalized in December 2018 for right MCA stroke due to R MCA occlusion from suspected embolus from proximal right ICA with stenosis.  He was treated  successfully with mechanical embolectomy and rescue ICA stenting.  Echocardiogram revealed normal LVEF with no significant valvular abnormalities.  TEE showed PFO with R>L shunt with Valsalva, but was not felt to be the source of the CVA.  He was last seen in clinic 04/15/2022 and reported stable claudication.  He had reported burning to the top of his feet that sounded more like neuropathy.  He was continued on aspirin and statin therapy, recommended smoking cessation with his moderate to severe COPD, chest CT showed lung nodules.  He returns to clinic today accompanied by his wife stating that he has been feeling well.  He states over the last month his legs have been doing well.  He did note that he was having cramps during the evening that would wake him from sleep during the summer but he was unsure to whether it was related to dehydration or his peripheral arterial disease.  He is also suffering from a hydrocele and has recently undergone an ultrasound of the scrotal/testicular area.  He is awaiting next steps on treatment.  He continues to smoke roughly 2 packs/day.  They have several questions today as to well if he should continue on aspirin or changed to clopidogrel previously when he was given the option of continuing with aspirin or clopidogrel he stated that he was bruising a lot and it was related to the Plavix, but unfortunately at that time he also had an increased amount of alcohol use.  He also has several questions today  about continuing statin therapy as he was advised that his cholesterol was low.  He denies chest pain denies shortness of breath, denies palpitations, denies lightheadedness or dizziness, and denies any claudication symptoms.  He also denies any hospitalizations or visits to the emergency department.  They do have an appointment with the ENT today after having a follow-up MRI there was some new findings and that they are checking up on after recent visit with her PCP.  Home  Medications    Current Outpatient Medications  Medication Sig Dispense Refill   albuterol (VENTOLIN HFA) 108 (90 Base) MCG/ACT inhaler SMARTSIG:2 Puff(s) By Mouth Every 6 Hours PRN     clopidogrel (PLAVIX) 75 MG tablet Take 1 tablet (75 mg total) by mouth daily. 90 tablet 3   atorvastatin (LIPITOR) 40 MG tablet TAKE 1 TABLET(40 MG) BY MOUTH DAILY AT 6 PM 90 tablet 3   No current facility-administered medications for this visit.     Family History    Family History  Problem Relation Age of Onset   COPD Mother    Dementia Father    He indicated that his mother is deceased. He indicated that his father is alive. He indicated that his sister is alive. He indicated that his brother is alive. He indicated that his maternal grandmother is deceased. He indicated that his maternal grandfather is deceased. He indicated that his paternal grandmother is deceased. He indicated that his paternal grandfather is deceased.  Social History    Social History   Socioeconomic History   Marital status: Married    Spouse name: Not on file   Number of children: Not on file   Years of education: Not on file   Highest education level: Not on file  Occupational History   Not on file  Tobacco Use   Smoking status: Every Day    Packs/day: 1.00    Years: 40.00    Total pack years: 40.00    Types: Cigarettes   Smokeless tobacco: Former   Tobacco comments:    Currently smokes 2 packs a day. ARJ 08/10/20  Vaping Use   Vaping Use: Never used  Substance and Sexual Activity   Alcohol use: Yes   Drug use: No   Sexual activity: Yes  Other Topics Concern   Not on file  Social History Narrative   Not on file   Social Determinants of Health   Financial Resource Strain: Not on file  Food Insecurity: Not on file  Transportation Needs: Not on file  Physical Activity: Not on file  Stress: Not on file  Social Connections: Not on file  Intimate Partner Violence: Not on file     Review of Systems     General:  No chills, fever, night sweats or weight changes.  Cardiovascular:  No chest pain, dyspnea on exertion, edema, orthopnea, palpitations, paroxysmal nocturnal dyspnea. Dermatological: No rash, lesions/masses Respiratory: No cough, dyspnea, endorses occasional wheezing Musculoskeletal: Endorses muscle cramps at night to the bilateral lower extremities Urologic: No hematuria, dysuria, endorses scrotal pain from hydrocele Abdominal:   No nausea, vomiting, diarrhea, bright red blood per rectum, melena, or hematemesis Neurologic:  No visual changes, wkns, changes in mental status. All other systems reviewed and are otherwise negative except as noted above.   Physical Exam    VS:  BP 130/60 (BP Location: Left Arm, Patient Position: Sitting, Cuff Size: Normal)   Pulse (!) 57   Ht 5\' 11"  (1.803 m)   Wt 159 lb 6.4 oz (72.3  kg)   SpO2 97%   BMI 22.23 kg/m  , BMI Body mass index is 22.23 kg/m.     GEN: Well nourished, well developed, in no acute distress. HEENT: normal. Neck: Supple, no JVD, carotid bruits, or masses. Cardiac: RRR, no murmurs, rubs, or gallops. No clubbing, cyanosis, edema.  Radials/DP/PT 2+ and equal bilaterally.  Respiratory:  Respirations regular and unlabored, clear to auscultation bilaterally.  Positive for scattered rhonchi throughout.  GI: Soft, nontender, nondistended, BS + x 4. MS: no deformity or atrophy. Skin: warm and dry, no rash. Neuro:  Strength and sensation are intact. Psych: Normal affect.  Accessory Clinical Findings    ECG personally reviewed by me today-sinus bradycardia with a rate of 57 and LVH- No acute changes  Lab Results  Component Value Date   WBC 7.9 01/14/2020   HGB 15.6 01/14/2020   HCT 45.3 01/14/2020   MCV 90 01/14/2020   PLT 198 01/14/2020   Lab Results  Component Value Date   CREATININE 0.83 01/14/2020   BUN 6 (L) 01/14/2020   NA 140 01/14/2020   K 4.6 01/14/2020   CL 102 01/14/2020   CO2 23 01/14/2020   Lab  Results  Component Value Date   ALT 13 01/14/2020   AST 19 01/14/2020   GGT 76 (H) 01/22/2018   ALKPHOS 87 01/14/2020   BILITOT 0.9 01/14/2020   Lab Results  Component Value Date   CHOL 108 01/14/2020   HDL 43 01/14/2020   LDLCALC 50 01/14/2020   TRIG 68 01/14/2020   CHOLHDL 2.5 01/14/2020    Lab Results  Component Value Date   HGBA1C 5.3 10/10/2017    Assessment & Plan   1.  Peripheral arterial disease which she continues to deny claudication symptoms.  The only symptoms that he had with possible venous insufficiency symptoms of muscle cramps in the evening while he had gone to bed likely from dehydration during the summer months.  He has a known occluded distal right SFA.  Unfortunately he has increased the amount of smoking back up to 2 packs/day.  Previously he had opted to continue with aspirin versus Plavix and now after further discussion and encouragement from his wife he states that he would prefer to be on clopidogrel 75 mg daily versus singular aspirin therapy.  She has been advised to discontinue his aspirin and restart clopidogrel 75 mg daily.  He is also been advised to continue on his statin therapy and the new cholesterol is too low as that was his previous concern.  2.  Hyperlipidemia with an LDL of 49 on 11/08/2021 which is stable at goal.  He was continued on atorvastatin 40 mg daily.  His lipid panel is followed by PCP.  3.  Carotid artery stenosis with right carotid stent consistent with 1-39% stenosis nonhemodynamically significant plaque<50% noted in the CCA, ECA<50% stenosis, left carotid ICA is consistent with 1-39% stenosis nonhemodynamically significant plaque<50% noted in the CCA, ECA<appears 50% stenosed.  Report recommend follow-up in 1 year the study was completed 04/29/2021.  Carotid duplex study ordered.  4.  COPD with continued tobacco use.  Recently diagnosed with moderate to severe COPD and is increased the amount he smokes to 2 packs/day.  He continues  to follow with pulmonary.  CT of the chest did reveal lung nodules plan is to be continued with intermittent imaging.  Complete cessation is recommended that he is currently not ready.  5.  Disposition patient return to clinic to see MD in  6 months or sooner if needed.  Ezequiel Macauley, NP 09/19/2022, 9:38 AM

## 2022-09-19 ENCOUNTER — Ambulatory Visit: Payer: 59 | Attending: Cardiovascular Disease | Admitting: Cardiology

## 2022-09-19 ENCOUNTER — Encounter: Payer: Self-pay | Admitting: Cardiology

## 2022-09-19 VITALS — BP 130/60 | HR 57 | Ht 71.0 in | Wt 159.4 lb

## 2022-09-19 DIAGNOSIS — J449 Chronic obstructive pulmonary disease, unspecified: Secondary | ICD-10-CM | POA: Diagnosis not present

## 2022-09-19 DIAGNOSIS — I63231 Cerebral infarction due to unspecified occlusion or stenosis of right carotid arteries: Secondary | ICD-10-CM | POA: Diagnosis not present

## 2022-09-19 DIAGNOSIS — E782 Mixed hyperlipidemia: Secondary | ICD-10-CM

## 2022-09-19 DIAGNOSIS — I6523 Occlusion and stenosis of bilateral carotid arteries: Secondary | ICD-10-CM

## 2022-09-19 DIAGNOSIS — Z720 Tobacco use: Secondary | ICD-10-CM

## 2022-09-19 DIAGNOSIS — I739 Peripheral vascular disease, unspecified: Secondary | ICD-10-CM

## 2022-09-19 MED ORDER — ATORVASTATIN CALCIUM 40 MG PO TABS: ORAL_TABLET | ORAL | 3 refills | Status: DC

## 2022-09-19 MED ORDER — CLOPIDOGREL BISULFATE 75 MG PO TABS: 75.0000 mg | ORAL_TABLET | Freq: Every day | ORAL | 3 refills | Status: DC

## 2022-09-19 NOTE — Patient Instructions (Signed)
Medication Instructions:  Your physician has recommended you make the following change in your medication:   STOP Aspirin START Clopidogrel (Plavix) 75 mg once daily  *If you need a refill on your cardiac medications before your next appointment, please call your pharmacy*   Lab Work: None  If you have labs (blood work) drawn today and your tests are completely normal, you will receive your results only by: MyChart Message (if you have MyChart) OR A paper copy in the mail If you have any lab test that is abnormal or we need to change your treatment, we will call you to review the results.   Testing/Procedures: None   Follow-Up: At [facility], you and your health needs are our priority.  As part of our continuing mission to provide you with exceptional heart care, we have created designated Provider Care Teams.  These Care Teams include your primary Cardiologist (physician) and Advanced Practice Providers (APPs -  Physician Assistants and Nurse Practitioners) who all work together to provide you with the care you need, when you need it.   Your next appointment:   6 month(s)  The format for your next appointment:   In Person  Provider:   [name], MD or Charlsie Quest, NP       Important Information About Sugar

## 2022-09-20 ENCOUNTER — Ambulatory Visit: Payer: 59 | Admitting: Urology

## 2022-09-20 ENCOUNTER — Encounter: Payer: Self-pay | Admitting: Urology

## 2022-09-20 VITALS — BP 133/75 | HR 62 | Ht 71.0 in | Wt 159.0 lb

## 2022-09-20 DIAGNOSIS — N433 Hydrocele, unspecified: Secondary | ICD-10-CM | POA: Diagnosis not present

## 2022-09-20 DIAGNOSIS — I2510 Atherosclerotic heart disease of native coronary artery without angina pectoris: Secondary | ICD-10-CM | POA: Diagnosis not present

## 2022-09-20 DIAGNOSIS — R350 Frequency of micturition: Secondary | ICD-10-CM

## 2022-09-20 NOTE — Progress Notes (Signed)
09/20/2022 10:01 AM   Raymond Lowery 1956/10/01 [id number]  Referring provider: Medicine, [facility] No address on file  Chief Complaint  Patient presents with   Hydrocele    HPI: 66 year old male referred for further evaluation of hydrocele.  Notably, he [facility] and counseled to undergo hydrocelectomy but did not feel comfortable at the time of the surgery for this situation and elected to forego this.  He has had scrotal ultrasounds which indicate right hydrocele.  He is worried about nodularity on the backside of his right hemiscrotum.  His last ultrasound was on 07/2021 showing normal bilateral testicles with good flow and a large right hydrocele extending into the inguinal canal.  He reports over the past several years has had some enlarging bulging of the right hemiscrotum.  This reached the size with painful, dull aching and bothersome.  He does feel like sometimes this is worse than others.  He does not thus I sometimes fluctuates but has been persistently enlarged in the recent past.  He does have a significant medical history including a personal history of CVA, peripheral artery disease, COPD.  He is on a baby aspirin now but recently has discussed going back to Plavix.  He is now managed by Dr. Kirke Corin.  He also has complaints of urinary frequency urgency and hesitancy.  He is not on any GU medications.  He also would like to have PSA screening.  He and his wife today have multiple concerns of which are not all GU in origin.  She is worried about his cough and weight loss.  He is continued heavy smoker with severe COPD.  He is overdue for chest CT.   PMH: Past Medical History:  Diagnosis Date   Carotid artery disease (HCC)    s/p L ICA stent 2018    Hyperlipidemia    PAD (peripheral artery disease) (HCC)    Known occluded distal right SFA with moderately reduced ABI   PFO (patent  foramen ovale)    Stroke (HCC)    Tobacco use     Surgical History: Past Surgical History:  Procedure Laterality Date   BACK SURGERY     IR ANGIO EXTRACRAN SEL COM CAROTID INNOMINATE UNI L MOD SED  10/09/2017   IR ANGIO VERTEBRAL SEL SUBCLAVIAN INNOMINATE UNI R MOD SED  10/09/2017   IR INTRAVSC STENT CERV CAROTID W/O EMB-PROT MOD SED INC ANGIO  10/09/2017   IR PERCUTANEOUS ART THROMBECTOMY/INFUSION INTRACRANIAL INC DIAG ANGIO  10/09/2017   IR RADIOLOGIST EVAL & MGMT  11/06/2017   JOINT REPLACEMENT     RADIOLOGY WITH ANESTHESIA N/A 10/09/2017   Procedure: RADIOLOGY WITH ANESTHESIA;  Surgeon: Radiologist, Medication, MD;  Location: [facility] OR;  Service: Radiology;  Laterality: N/A;   TEE WITHOUT CARDIOVERSION N/A 01/26/2018   Procedure: TRANSESOPHAGEAL ECHOCARDIOGRAM (TEE);  Surgeon: Yates Decamp, MD;  Location: [facility] ENDOSCOPY;  Service: Cardiovascular;  Laterality: N/A;    Home Medications:  Allergies as of 09/20/2022   No Known Allergies      Medication List        Accurate as of September 20, 2022 11:59 PM. If you have any questions, ask your nurse or doctor.          albuterol 108 (90 Base) MCG/ACT inhaler Commonly known as: VENTOLIN HFA SMARTSIG:2 Puff(s) By Mouth Every 6 Hours PRN   atorvastatin 40 MG tablet Commonly known as: LIPITOR TAKE 1 TABLET(40 MG) BY  MOUTH DAILY AT 6 PM   clopidogrel 75 MG tablet Commonly known as: PLAVIX Take 1 tablet (75 mg total) by mouth daily.   fluticasone 50 MCG/ACT nasal spray Commonly known as: FLONASE Place into both nostrils.        Allergies: No Known Allergies  Family History: Family History  Problem Relation Age of Onset   COPD Mother    Dementia Father     Social History:  reports that he has been smoking cigarettes. He has a 40.00 pack-year smoking history. He has quit using smokeless tobacco. He reports current alcohol use. He reports that he does not use drugs.   Physical Exam: BP 133/75   Pulse 62   Ht 5\' 11"   (1.803 m)   Wt 159 lb (72.1 kg)   BMI 22.18 kg/m   Constitutional:  Alert and oriented, No acute distress. HEENT: [place] AT, moist mucus membranes.  Trachea midline, no masses. Cardiovascular: No clubbing, cyanosis, or edema. Respiratory: Normal respiratory effort, no increased work of breathing. GI: Abdomen is soft, nontender, nondistended, no abdominal masses GU: Large spaghetti squash size hydrocele on the right, right testicle is palpable on the posterior aspect of this.  Left testicle is normal.  Hydrocele extends into the inguinal canal but it appears to be blind-ending.  No obvious hernia. Neurologic: Grossly intact, no focal deficits, moving all 4 extremities. Psychiatric: Normal mood and affect.  Laboratory Data: Lab Results  Component Value Date   WBC 7.9 01/14/2020   HGB 15.6 01/14/2020   HCT 45.3 01/14/2020   MCV 90 01/14/2020   PLT 198 01/14/2020    Lab Results  Component Value Date   CREATININE 0.83 01/14/2020     Lab Results  Component Value Date   HGBA1C 5.3 10/10/2017    Assessment & Plan:    1. Hydrocele, unspecified hydrocele type Symptomatic right hydrocele, discussed the option of hydrocelectomy.  As per his previous surgical consultation, just discussed the risk of recurrence which is approximately 10% or perhaps higher given that he has multiple comorbidities and may need to remain on baby aspirin.  We discussed other complications including bleeding, damage surrounding structures, infection, dehiscence, amongst others.  We discussed the post operative care including avoidance of strenuous activities for at least 2-4 weeks followed by continued supportive care with scrotal support/ - PSA; Future - PSA  2. Urinary frequency Discussed the option for trial of Flomax, declined  Will address further at follow-up visit  He did want to have a PSA checked today for baseline which is reasonable but I did elect to defer rectal exam given his comorbidities  and life expectancy.  3. Cardiovascular disease Multiple medical comorbidities on antiplatelet therapy, continues to smoke heavily 2 packs/day  He will need cardiac clearance and encourage smoking cessation follow-up patient shows no indication that he is going to do so.  I did encourage him to follow-up with his chest CT as well.  We discussed today that the procedure could be done on a baby aspirin although not ideal, although in this incidence would need a drain in high risk for hematoma or bleeding complications as well as hydrocele recurrence.  If he transitions back to Plavix, this will be needed to be held at minimum 5 days before the procedure and several days postop.   14/08/2017, MD  [facility] [address] [place], [place] [phone]  I spent 65 total minutes on the day of the encounter including  pre-visit review of the medical record, face-to-face time with the patient, and post visit ordering of labs/imaging/tests.

## 2022-09-20 NOTE — H&P (View-Only) (Signed)
 09/20/2022 10:01 AM   Raymond Lowery 09/16/1956 [id number]  Referring provider: Medicine, [facility] No address on file  Chief Complaint  Patient presents with   Hydrocele    HPI: 66-year-old male referred for further evaluation of hydrocele.  Notably, [facility] and counseled to undergo hydrocelectomy but did not feel comfortable at the time of the surgery for this situation and elected to forego this.  He has had scrotal ultrasounds which indicate right hydrocele.  He is worried about nodularity on the backside of his right hemiscrotum.  His last ultrasound was on 07/2021 showing normal bilateral testicles with good flow and a large right hydrocele extending into the inguinal canal.  He reports over the past several years has had some enlarging bulging of the right hemiscrotum.  This reached the size with painful, dull aching and bothersome.  He does feel like sometimes this is worse than others.  He does not thus I sometimes fluctuates but has been persistently enlarged in the recent past.  He does have a significant medical history including a personal history of CVA, peripheral artery disease, COPD.  He is on a baby aspirin now but recently has discussed going back to Plavix.  He is now managed by Dr. Arida.  He also has complaints of urinary frequency urgency and hesitancy.  He is not on any GU medications.  He also would like to have PSA screening.  He and his wife today have multiple concerns of which are not all GU in origin.  She is worried about his cough and weight loss.  He is continued heavy smoker with severe COPD.  He is overdue for chest CT.   PMH: Past Medical History:  Diagnosis Date   Carotid artery disease (HCC)    s/p L ICA stent 2018    Hyperlipidemia    PAD (peripheral artery disease) (HCC)    Known occluded distal right SFA with moderately reduced ABI   PFO (patent  foramen ovale)    Stroke (HCC)    Tobacco use     Surgical History: Past Surgical History:  Procedure Laterality Date   BACK SURGERY     IR ANGIO EXTRACRAN SEL COM CAROTID INNOMINATE UNI L MOD SED  10/09/2017   IR ANGIO VERTEBRAL SEL SUBCLAVIAN INNOMINATE UNI R MOD SED  10/09/2017   IR INTRAVSC STENT CERV CAROTID W/O EMB-PROT MOD SED INC ANGIO  10/09/2017   IR PERCUTANEOUS ART THROMBECTOMY/INFUSION INTRACRANIAL INC DIAG ANGIO  10/09/2017   IR RADIOLOGIST EVAL & MGMT  11/06/2017   JOINT REPLACEMENT     RADIOLOGY WITH ANESTHESIA N/A 10/09/2017   Procedure: RADIOLOGY WITH ANESTHESIA;  Surgeon: Radiologist, Medication, MD;  Location: [facility] OR;  Service: Radiology;  Laterality: N/A;   TEE WITHOUT CARDIOVERSION N/A 01/26/2018   Procedure: TRANSESOPHAGEAL ECHOCARDIOGRAM (TEE);  Surgeon: Ganji, Jay, MD;  Location: [facility];  Service: Cardiovascular;  Laterality: N/A;    Home Medications:  Allergies as of 09/20/2022   No Known Allergies      Medication List        Accurate as of September 20, 2022 11:59 PM. If you have any questions, ask your nurse or doctor.          albuterol 108 (90 Base) MCG/ACT inhaler Commonly known as: VENTOLIN HFA SMARTSIG:2 Puff(s) By Mouth Every 6 Hours PRN   atorvastatin 40 MG tablet Commonly known as: LIPITOR TAKE 1 TABLET(40 MG) BY   MOUTH DAILY AT 6 PM   clopidogrel 75 MG tablet Commonly known as: PLAVIX Take 1 tablet (75 mg total) by mouth daily.   fluticasone 50 MCG/ACT nasal spray Commonly known as: FLONASE Place into both nostrils.        Allergies: No Known Allergies  Family History: Family History  Problem Relation Age of Onset   COPD Mother    Dementia Father     Social History:  reports that he has been smoking cigarettes. He has a 40.00 pack-year smoking history. He has quit using smokeless tobacco. He reports current alcohol use. He reports that he does not use drugs.   Physical Exam: BP 133/75   Pulse 62   Ht 5\' 11"   (1.803 m)   Wt 159 lb (72.1 kg)   BMI 22.18 kg/m   Constitutional:  Alert and oriented, No acute distress. HEENT: [place] AT, moist mucus membranes.  Trachea midline, no masses. Cardiovascular: No clubbing, cyanosis, or edema. Respiratory: Normal respiratory effort, no increased work of breathing. GI: Abdomen is soft, nontender, nondistended, no abdominal masses GU: Large spaghetti squash size hydrocele on the right, right testicle is palpable on the posterior aspect of this.  Left testicle is normal.  Hydrocele extends into the inguinal canal but it appears to be blind-ending.  No obvious hernia. Neurologic: Grossly intact, no focal deficits, moving all 4 extremities. Psychiatric: Normal mood and affect.  Laboratory Data: Lab Results  Component Value Date   WBC 7.9 01/14/2020   HGB 15.6 01/14/2020   HCT 45.3 01/14/2020   MCV 90 01/14/2020   PLT 198 01/14/2020    Lab Results  Component Value Date   CREATININE 0.83 01/14/2020     Lab Results  Component Value Date   HGBA1C 5.3 10/10/2017    Assessment & Plan:    1. Hydrocele, unspecified hydrocele type Symptomatic right hydrocele, discussed the option of hydrocelectomy.  As per his previous surgical consultation, just discussed the risk of recurrence which is approximately 10% or perhaps higher given that he has multiple comorbidities and may need to remain on baby aspirin.  We discussed other complications including bleeding, damage surrounding structures, infection, dehiscence, amongst others.  We discussed the post operative care including avoidance of strenuous activities for at least 2-4 weeks followed by continued supportive care with scrotal support/ - PSA; Future - PSA  2. Urinary frequency Discussed the option for trial of Flomax, declined  Will address further at follow-up visit  He did want to have a PSA checked today for baseline which is reasonable but I did elect to defer rectal exam given his comorbidities  and life expectancy.  3. Cardiovascular disease Multiple medical comorbidities on antiplatelet therapy, continues to smoke heavily 2 packs/day  He will need cardiac clearance and encourage smoking cessation follow-up patient shows no indication that he is going to do so.  I did encourage him to follow-up with his chest CT as well.  We discussed today that the procedure could be done on a baby aspirin although not ideal, although in this incidence would need a drain in high risk for hematoma or bleeding complications as well as hydrocele recurrence.  If he transitions back to Plavix, this will be needed to be held at minimum 5 days before the procedure and several days postop.   14/08/2017, MD  [facility] [address] [place], [place] [phone]  I spent 65 total minutes on the day of the encounter including  pre-visit review of the medical record, face-to-face time with the patient, and post visit ordering of labs/imaging/tests.   

## 2022-09-21 LAB — PSA: Prostate Specific Ag, Serum: 0.6 ng/mL (ref 0.0–4.0)

## 2022-09-23 ENCOUNTER — Other Ambulatory Visit: Payer: Self-pay | Admitting: Urology

## 2022-09-23 DIAGNOSIS — N433 Hydrocele, unspecified: Secondary | ICD-10-CM

## 2022-09-23 NOTE — Progress Notes (Signed)
Surgical Physician Order Form [facility] [place]  * Scheduling expectation : Next Available  *Length of Case:   *Clearance needed: yes; cardiac Dr. Kirke Corin to hold Plavix, see my office note  *Anticoagulation Instructions: Hold all anticoagulants  *Aspirin Instructions:  see above  *Post-op visit Date/Instructions:   2-3 days for drain removal, 6 weeks wound check  *Diagnosis:  right hydrocele  *Procedure: right Hydrocele excision groin/unilateral scrotal approach (62703)   Additional orders: N/A  -Admit type: OUTpatient  -Anesthesia: General  -VTE Prophylaxis Standing Order SCD's       Other:   -Standing Lab Orders Per Anesthesia    Lab other: None  -Standing Test orders EKG/Chest x-ray per Anesthesia       Test other:   - Medications:  Ancef 2gm IV  -Other orders:  N/A

## 2022-09-28 ENCOUNTER — Telehealth: Payer: Self-pay

## 2022-09-28 ENCOUNTER — Telehealth: Payer: Self-pay | Admitting: *Deleted

## 2022-09-28 NOTE — Telephone Encounter (Signed)
   Pre-operative Risk Assessment    Patient Name: Raymond Lowery  DOB: 08/28/1956 MRN: [id number]      Request for Surgical Clearance    Procedure:   RIGHT HYDROCELECTOMY   Date of Surgery:  Clearance 10/10/22                                 Surgeon:  DR. Vanna Scotland Surgeon's Group or Practice Name:  [facility] Phone number:  [phone] Fax number: [phone] ATTN: MELISSA   Type of Clearance Requested:   - Medical  - Pharmacy:  Hold Clopidogrel (Plavix)     Type of Anesthesia:  General    Additional requests/questions:    Elpidio Anis   09/28/2022, 5:40 PM

## 2022-09-28 NOTE — Progress Notes (Signed)
REQUEST FOR SURGICAL CLEARANCE       Date: Date: 09/28/22  Faxed to: Dr. Kirke Corin  Surgeon: Dr. Vanna Scotland, MD     Date of Surgery: 10/10/2022  Operation: Right Hydrocelectomy  Anesthesia Type: General   Diagnosis: Right Hydrocele  Patient Requires:   Cardiac / Vascular Clearance : Yes  Reason: Will need patient to hold Plavix    Risk Assessment:    Low   []       Moderate   []     High   []           This patient is optimized for surgery  YES []       NO   []    I recommend further assessment/workup prior to surgery. YES []      NO  []   Appointment scheduled for: _______________________   Further recommendations: ____________________________________     Physician Signature:__________________________________   Printed Name: ________________________________________   Date: _________________

## 2022-09-28 NOTE — Telephone Encounter (Signed)
I spoke with [name]. We have discussed possible surgery dates and Monday December 11th, 2023 was agreed upon by all parties. Patient given information about surgery date, what to expect pre-operatively and post operatively.  We discussed that a Pre-Admission Testing office will be calling to set up the pre-op visit that will take place prior to surgery, and that these appointments are typically done over the phone with a Pre-Admissions RN.  Informed patient that our office will communicate any additional care to be provided after surgery. Patients questions or concerns were discussed during our call. Advised to call our office should there be any additional information, questions or concerns that arise. Patient verbalized understanding.

## 2022-09-28 NOTE — Progress Notes (Signed)
   [facility] From  Surgery Date: Date: 10/10/2022  Surgeon: Dr. Vanna Scotland, MD  Inpt ( No  )   Outpt (Yes)   Obs ( No  )   Diagnosis: N43.3 Right Hydrocele  -CPT: 55040  Surgery: Right Hydrocelectomy  Stop Anticoagulations: Yes  Cardiac/Medical/Pulmonary Clearance needed: yes  Clearance needed from Dr: Kirke Corin  Clearance request sent on: Date: 09/28/22  *Orders entered into EPIC  Date: 09/28/22   *Case booked in EPIC  Date: 09/27/2022  *Notified pt of Surgery: Date: 09/27/2022  PRE-OP UA & CX: no  *Placed into Prior Authorization Work [place] Date: 09/28/22  Assistant/laser/rep:No

## 2022-09-29 NOTE — Telephone Encounter (Signed)
     Primary Cardiologist: [name], MD  Chart reviewed as part of pre-operative protocol coverage. Given past medical history and time since last visit, based on ACC/AHA guidelines, Dejon Ramsey Guadamuz would be at acceptable risk for the planned procedure without further cardiovascular testing.   His Plavix may be held for 5 days prior to his procedure.  Please resume as soon as hemostasis is achieved.  I will route this recommendation to the requesting party via Epic fax function and remove from pre-op pool.  Please call with questions.  Thomasene Ripple. Hanley Rispoli NP-C     09/29/2022, 11:57 AM [facility] [address] Office [phone] Fax [phone]

## 2022-10-05 ENCOUNTER — Encounter
Admission: RE | Admit: 2022-10-05 | Discharge: 2022-10-05 | Disposition: A | Discharge status: Home / Self Care | Payer: 59 | Source: Ambulatory Visit | Attending: Urology | Admitting: Urology

## 2022-10-05 ENCOUNTER — Encounter: Payer: Self-pay | Admitting: Urology

## 2022-10-05 HISTORY — DX: Unspecified viral hepatitis C without hepatic coma: B19.20

## 2022-10-05 HISTORY — DX: Hepatic fibrosis, unspecified: K74.00

## 2022-10-05 HISTORY — DX: Chronic obstructive pulmonary disease, unspecified: J44.9

## 2022-10-05 NOTE — Progress Notes (Signed)
Perioperative Services  Pre-Admission/Anesthesia Testing Clinical Review  Date: 10/05/22  Patient Demographics:  Name: Raymond Lowery DOB:   03/26/1956 MRN:   [id number]  Planned Surgical Procedure(s):    Case: [id number] Date/Time: 10/10/22 1320   Procedure: HYDROCELECTOMY ADULT (Right)   Anesthesia type: General   Pre-op diagnosis: Right Hydrocele   Location: [facility] OR ROOM 09 / [place] [facility]   Surgeons: Hollice Espy, MD   NOTE: Available PAT nursing documentation and vital signs have been reviewed. Clinical nursing staff has updated patient's PMH/PSHx, current medication list, and drug allergies/intolerances to ensure comprehensive history available to assist in medical decision making as it pertains to the aforementioned surgical procedure and anticipated anesthetic course. Extensive review of available clinical information performed. [place] PMH and PSHx updated with any diagnoses/procedures that  may have been inadvertently omitted during his intake with the pre-admission testing department's nursing staff.  Clinical Discussion:  Raymond Lowery is a 66 y.o. male who is submitted for pre-surgical anesthesia review and clearance prior to him undergoing the above procedure. Patient is a Current Smoker (60 pack years). Pertinent PMH includes: CVA, PFO, aortic atheroma, PAD, carotid artery disease, HLD, COPD, hepatic fibrosis, HCV (s/p treatment to resolution), RIGHT hydrocele.  Patient is followed by cardiology Fletcher Anon, MD). He was last seen in the cardiology clinic on 09/19/2022; notes reviewed. At the time of his clinic visit, the patient denied any chest pain, shortness of breath, PND, orthopnea, palpitations, significant peripheral edema, vertiginous symptoms, or presyncope/syncope.  Patient with a PMH significant for cardiovascular diagnoses.  Patient suffered an RIGHT MCA ischemic infarct on 10/09/2017.  CT imaging of the brain revealed infarction involving  the basal ganglia, RIGHT insula, and frontal operculum air cortices.  Mechanical thrombectomy performed. PTA of the RICA was performed placing a 9-7 x 40 mm Xact.  Transesophageal echocardiogram was performed on 01/26/2018 revealing a normal left ventricular systolic function with an EF of 55-60%.  There were no regional wall motion abnormalities.  There was mild atheromatous calcification noted within the aortic arch.  Additionally, there was a moderate patent foramen ovale with moderate right to left shunting.  Patient underwent lower extremity Doppler study on 11/02/2021 patient with known PAD in his bilateral lower extremities.  Study revealed a 50-74% stenosis in the RIGHT iliac segment, 30-49% in the RIGHT SFA, and total occlusion in the SFA and/or popliteal artery.  Contralaterally, patient with 50-74% stenosis in the LEFT iliac segment, 50-74% stenosis in the deep femoral artery, and 30-49% in the SFA.   RIGHT ABI 0.55 (moderate) and LEFT ABI 0.91 (mild).  Patient on daily clinical therapy for his PAD diagnosis.  Patient reportedly compliant with therapy with no evidence or reports of GI bleeding.  Blood pressure reasonably controlled at 130/60 mmHg without the use of pharmacological intervention.  Patient on atorvastatin for his HLD diagnosis and ASCVD prevention.  He is not diabetic. Patient does not have an OSAH diagnosis. Functional capacity, as defined by DASI, is documented as being >/= 4 METS.  No changes were made to his medication regimen.  Patient follow-up with outpatient cardiology in 6 months or sooner if needed.  Raymond Lowery is scheduled for an elective RIGHT HYDROCELECTOMY on 10/10/2022 with Dr. Hollice Espy, MD.  Given patient's past medical history significant for cardiovascular diagnoses, presurgical cardiac clearance was sought by the PAT team. Per cardiology, "based ACC/AHA guidelines, the patient's past medical history, and the amount of time since his last clinic visit,  this patient would be at an overall ACCEPTABLE risk for the planned procedure without further cardiovascular testing or intervention at this time". In review of his medication reconciliation, it is noted that patient is currently on prescribed daily antiplatelet therapy. He has been instructed on recommendations for holding his clopidogrel for 5 days prior to his procedure with plans to restart as soon as postoperative bleeding risk felt to be minimized by his attending surgeon. The patient has been instructed that his last dose of his clopidogrel should be on 10/04/2022.  Patient makes mention of the fact that he began clopidogrel hold on 10/02/2022 citing the fact that he "did not want to bleed".  Patient denies previous perioperative complications with anesthesia in the past. In review of the available records, it is noted that patient underwent a general anesthetic course here at [facility] ([facility] in 09/2017 without documented complications.      09/20/2022    3:12 PM 09/19/2022    8:20 AM 04/15/2022    2:34 PM  Vitals with BMI  Height _0  _1  _2   Weight 159 lbs 159 lbs 6 oz 157 lbs  BMI 22.19 03.50 09.38  Systolic 182 993 716  Diastolic 75 60 66  Pulse 62 57 68    Providers/Specialists:   NOTE: Primary physician provider listed below. Patient may have been seen by APP or partner within same practice.   PROVIDER ROLE / SPECIALTY LAST Lu Duffel, MD Urology (Surgeon) 09/23/2022  Medicine, [facility] Family Primary Care Provider 08/30/2022  Kathlyn Sacramento, MD Cardiology 09/19/2022   Allergies:  Patient has no known allergies.  Current Home Medications:   No current facility-administered medications for this encounter.    albuterol (VENTOLIN HFA) 108 (90 Base) MCG/ACT inhaler   atorvastatin (LIPITOR) 40 MG tablet   clopidogrel (PLAVIX) 75 MG tablet   fluticasone (FLONASE) 50 MCG/ACT nasal spray   History:    Past Medical History:  Diagnosis Date   Acute right MCA stroke ([place]) 10/09/2017   a.) CT head 10/09/2017: acute RIGHT MCA infarct involving the basal ganglia, RIGHT insula, and frontal operculum air cortices --> mechanical thrombectomy performed.   Aortic atheroma 01/26/2018   Carotid artery disease ([place]) 10/09/2017   a.) s/p PTA 10/09/2017 --> stenting (9-7 x 40 mm Xact) of the RICA   COPD (chronic obstructive pulmonary disease) (HCC)    Hepatitis C    a.) treated with glecaprevir-pibrentasvir; follow up SVR12 viral load undetectable 09/07/2018   Hyperlipidemia    Liver fibrosis    Long term current use of antithrombotics/antiplatelets    a.) clopidogrel   PAD (peripheral artery disease) (HCC)    Known occluded distal right SFA with moderately reduced ABI   PFO (patent foramen ovale)    a.) TEE 01/26/2018: EF 55-60%, mod PFO with mod R-L shunt   Pneumonia 2012   Right hydrocele    Tobacco use    Past Surgical History:  Procedure Laterality Date   BACK SURGERY     neck   IR ANGIO EXTRACRAN SEL COM CAROTID INNOMINATE UNI L MOD SED  10/09/2017   IR ANGIO VERTEBRAL SEL SUBCLAVIAN INNOMINATE UNI R MOD SED  10/09/2017   IR INTRAVSC STENT CERV CAROTID W/O EMB-PROT MOD SED INC ANGIO  10/09/2017   IR PERCUTANEOUS ART THROMBECTOMY/INFUSION INTRACRANIAL INC DIAG ANGIO  10/09/2017   IR RADIOLOGIST EVAL & MGMT  11/06/2017   KNEE ARTHROSCOPY Left    RADIOLOGY WITH  ANESTHESIA N/A 10/09/2017   Procedure: RADIOLOGY WITH ANESTHESIA;  Surgeon: Radiologist, Medication, MD;  Location: [facility];  Service: Radiology;  Laterality: N/A;   TEE WITHOUT CARDIOVERSION N/A 01/26/2018   Procedure: TRANSESOPHAGEAL ECHOCARDIOGRAM (TEE);  Surgeon: Adrian Prows, MD;  Location: [facility] ENDOSCOPY;  Service: Cardiovascular;  Laterality: N/A;   Family History  Problem Relation Age of Onset   COPD Mother    Dementia Father    Social History   Tobacco Use   Smoking status: Every Day    Packs/day: 1.50    Years:  40.00    Total pack years: 60.00    Types: Cigarettes   Smokeless tobacco: Current    Types: Chew   Tobacco comments:    Currently smokes 2 packs a day.  Vaping Use   Vaping Use: Never used  Substance Use Topics   Alcohol use: Not Currently   Drug use: No    Pertinent Clinical Results:  LABS: Labs reviewed: Acceptable for surgery.    Ref Range & Units 08/30/2022  WBC 3.4 - 10.8 x10E3/uL 7.5  RBC 4.14 - 5.80 x10E6/uL 4.60  Hemoglobin 13.0 - 17.7 g/dL 15.0  Hematocrit 37.5 - 51.0 % 42.9  MCV 79 - 97 fL 93  MCH 26.6 - 33.0 pg 32.6  MCHC 31.5 - 35.7 g/dL 35.0  RDW 11.6 - 15.4 % 13.0  Platelet Count 150 - 450 x10E3/uL 232  Neutrophils Not Estab. % 61  Lymphs Relative Not Estab. % 28  Monocytes Not Estab. % 8  Eos Relative Not Estab. % 2  Basos Relative Not Estab. % 1  Neutrophils Absolute 1.4 - 7.0 x10E3/uL 4.6  Lymphocytes Absolute 0.7 - 3.1 x10E3/uL 2.1  Monocytes Absolute 0.1 - 0.9 x10E3/uL 0.6  Eosinophils Absolute 0.0 - 0.4 x10E3/uL 0.2  Basophils Absolute 0.0 - 0.2 x10E3/uL 0.1  Immature Granulocytes Not Estab. % 0  Immature Grans (Abs) 0.0 - 0.1 x10E3/uL 0.0  Resulting Agency  [facility]  Narrative Performed by [facility] Performed at:  [facility] [address], [place], [place]  [id number] Lab Director: Rush Farmer MD, Phone:  [phone] Specimen Collected: 08/30/22 17:00   Performed by: Maryan Puls Last Resulted: 08/31/22 04:35  Received From: [facility]  Result Received: 09/13/22 11:11    Ref Range & Units 08/30/2022  Glucose 70 - 99 mg/dL 111 High   BUN 8 - 27 mg/dL 7 Low   Creatinine 0.76 - 1.27 mg/dL 0.82  eGFR >59 mL/min/1.73 97  BUN/Creatinine Ratio 10 - 24 9 Low   Sodium 134 - 144 mmol/L 141  Potassium 3.5 - 5.2 mmol/L 4.2  Chloride 96 - 106 mmol/L 103  CO2 20 - 29 mmol/L 22  CALCIUM 8.6 - 10.2 mg/dL 9.2  Total Protein 6.0 - 8.5 g/dL 6.9  Albumin, Serum 3.9 - 4.9 g/dL 4.3  Globulin, Total 1.5 - 4.5 g/dL 2.6  Albumin/Globulin Ratio  1.2 - 2.2 1.7  Total Bilirubin 0.0 - 1.2 mg/dL 0.9  Alkaline Phosphatase 44 - 121 IU/L 77  AST 0 - 40 IU/L 18  ALT (SGPT) 0 - 44 IU/L 16  Resulting Agency  [facility]  Narrative Performed by [facility] Performed at:  [facility] [address] [place], [place]  [id number] Lab Director: Rush Farmer MD, Phone:  [phone] Specimen Collected: 08/30/22 17:00   Performed by: Maryan Puls Last Resulted: 08/31/22 04:35  Received From: [facility]  Result Received: 09/13/22 11:11    Date: 09/19/2022 Time ECG obtained: 0916 AM Rate: 57 bpm  Rhythm: sinus bradycardia Axis (leads I and aVF): Normal Intervals: PR 144 ms. QRS 90 ms. QTc 410 ms. ST segment and T wave changes: No evidence of acute ST segment elevation or depression Comparison: Similar to previous tracing obtained on 04/15/2022   IMAGING / PROCEDURES: MRI BRAIN WITHOUT CONTRAST performed on 08/03/2022 No acute intracranial abnormality. Remote right MCA territory infarct. Mild chronic microvascular ischemic changes of the white matter. Stable appearance of 1-2 mm inferiorly projecting outpouching from the supraclinoid right ICA, may represent an infundibulum versus a tiny aneurysm.   VAS US CAROTID performed on 04/29/2021 Right Carotid: Velocities in the right ICA are consistent with a 1-39% stenosis. Non-hemodynamically significant plaque <50% noted in the CCA. The ECA appears <50% stenosed.  Left Carotid: Velocities in the left ICA are consistent with a 1-39% stenosis. Non-hemodynamically significant plaque <50% noted in the CCA. The ECA appears <50% stenosed.  Vertebrals:  Bilateral vertebral arteries demonstrate antegrade flow.  Subclavians: Normal flow hemodynamics were seen in bilateral subclavian arteries.   TRANSESOPHAGEAL ECHOCARDIOGRAM performed on 03/29/209 Normal left ventricular systolic function with an EF of 55-60 percent Mild, fixed, not ulcerated atheromatous plaque in the aortic arch No evidence of  LAA thrombus Medium sized patent foramen ovale.  Agitated saline contrast study showed a moderate right to left shunt.  Impression and Plan:  Raymond Lowery has been referred for pre-anesthesia review and clearance prior to him undergoing the planned anesthetic and procedural courses. Available labs, pertinent testing, and imaging results were personally reviewed by me. This patient has been appropriately cleared by cardiology with an overall ACCEPTABLE risk of significant perioperative cardiovascular complications.  Based on clinical review performed today (10/05/22), barring any significant acute changes in the patient's overall condition, it is anticipated that he will be able to proceed with the planned surgical intervention. Any acute changes in clinical condition may necessitate his procedure being postponed and/or cancelled. Patient will meet with anesthesia team (MD and/or CRNA) on the day of his procedure for preoperative evaluation/assessment. Questions regarding anesthetic course will be fielded at that time.   Pre-surgical instructions were reviewed with the patient during his PAT appointment and questions were fielded by PAT clinical staff. Patient was advised that if any questions or concerns arise prior to his procedure then he should return a call to PAT and/or his surgeon's office to discuss.  Honor Loh, MSN, APRN, FNP-C, CEN [facility]  Peri-operative Services Nurse Practitioner Phone: [phone] Fax: [phone] 10/05/22 1:21 PM  NOTE: This note has been prepared using [employer]. Despite my best ability to proofread, there is always the potential that unintentional transcriptional errors may still occur from this process.

## 2022-10-05 NOTE — Patient Instructions (Addendum)
Your procedure is scheduled on:10-10-22 Monday Report to the Registration Desk on the 1st floor of the [facility].Then proceed to the 2nd floor Surgery Desk To find out your arrival time, please call [phone] between 1PM - 3PM on:10-07-22 Friday If your arrival time is 6:00 am, do not arrive prior to that time as the [facility] entrance doors do not open until 6:00 am.  REMEMBER: Instructions that are not followed completely may result in serious medical risk, up to and including death; or upon the discretion of your surgeon and anesthesiologist your surgery may need to be rescheduled.  Do not eat food OR drink any liquids after midnight the night before surgery.  No gum chewing, lozengers or hard candies.  TAKE THESE MEDICATIONS THE MORNING OF SURGERY WITH A SIP OF WATER: -atorvastatin (LIPITOR)   Use your Albuterol Inhaler at home the morning of surgery and bring your Inhaler to the hospital  Last dose of clopidogrel (PLAVIX) was on 10-02-22  One week prior to surgery: Stop Anti-inflammatories (NSAIDS) such as Advil, Aleve, Ibuprofen, Motrin, Naproxen, Naprosyn and Aspirin based products such as Excedrin, Goodys Powder, BC Powder.You may however, continue to take Tylenol if needed for pain up until the day of surgery  Stop [place] supplements/vitamins NOW (10-05-22) until after surgery.  No Alcohol for 24 hours before or after surgery.  No Smoking including e-cigarettes for 24 hours prior to surgery.  No chewable tobacco products for at least 6 hours prior to surgery.  No nicotine patches on the day of surgery.  Do not use any "recreational" drugs for at least a week prior to your surgery.  Please be advised that the combination of cocaine and anesthesia may have negative outcomes, up to and including death. If you test positive for cocaine, your surgery will be cancelled.  On the morning of surgery brush your teeth with toothpaste and water, you may rinse your  mouth with mouthwash if you wish. Do not swallow any toothpaste or mouthwash.  Do not wear jewelry, make-up, hairpins, clips or nail polish.  Do not wear lotions, powders, or perfumes.   Do not shave body from the neck down 48 hours prior to surgery just in case you cut yourself which could leave a site for infection.  Also, freshly shaved skin may become irritated if using the CHG soap.  Contact lenses, hearing aids and dentures may not be worn into surgery.  Do not bring valuables to the hospital. [facility] is not responsible for any missing/lost belongings or valuables.   Notify your doctor if there is any change in your medical condition (cold, fever, infection).  Wear comfortable clothing (specific to your surgery type) to the hospital.  After surgery, you can help prevent lung complications by doing breathing exercises.  Take deep breaths and cough every 1-2 hours. Your doctor may order a device called an Incentive Spirometer to help you take deep breaths. When coughing or sneezing, hold a pillow firmly against your incision with both hands. This is called "splinting." Doing this helps protect your incision. It also decreases belly discomfort.  If you are being admitted to the hospital overnight, leave your suitcase in the car. After surgery it may be brought to your room.  If you are being discharged the day of surgery, you will not be allowed to drive home. You will need a responsible adult (18 years or older) to drive you home and stay with you that night.   If  you are taking public transportation, you will need to have a responsible adult (18 years or older) with you. Please confirm with your physician that it is acceptable to use public transportation.   Please call the Pre-admissions Testing Dept. at [phone] if you have any questions about these instructions.  Surgery Visitation Policy:  Patients undergoing a surgery or procedure may have two family members or  support persons with them as long as the person is not COVID-19 positive or experiencing its symptoms.   MASKING: Due to an increase in RSV rates and hospitalizations, in-patient care areas in which we serve newborns, infants and children, masks will be required for teammates and visitors.  Children ages 42 and under may not visit. This policy affects the following departments only:  [facility] Postpartum area Mother Baby Unit Newborn nursery/Special care nursery  Other areas: Masks continue to be strongly recommended for patient-facing teammates, visitors and patients in all other areas. Visitation is not restricted outside of the units listed above.

## 2022-10-09 MED ORDER — ORAL CARE MOUTH RINSE: 15.0000 mL | Freq: Once | OROMUCOSAL | Status: AC

## 2022-10-09 MED ORDER — CEFAZOLIN SODIUM-DEXTROSE 2-4 GM/100ML-% IV SOLN
2.0000 g | INTRAVENOUS | Status: AC
  Administered 2022-10-10: 2 g via INTRAVENOUS

## 2022-10-09 MED ORDER — LACTATED RINGERS IV SOLN: INTRAVENOUS | Status: DC

## 2022-10-09 MED ORDER — FAMOTIDINE 20 MG PO TABS: 20.0000 mg | ORAL_TABLET | Freq: Once | ORAL | Status: AC

## 2022-10-09 MED ORDER — CHLORHEXIDINE GLUCONATE 0.12 % MT SOLN: 15.0000 mL | Freq: Once | OROMUCOSAL | Status: AC

## 2022-10-10 ENCOUNTER — Other Ambulatory Visit: Payer: Self-pay

## 2022-10-10 ENCOUNTER — Encounter
Admission: RE | Disposition: A | Discharge status: Home / Self Care | Payer: Self-pay | Source: Home / Self Care | Attending: Urology

## 2022-10-10 ENCOUNTER — Ambulatory Visit: Payer: 59 | Admitting: Urgent Care

## 2022-10-10 ENCOUNTER — Ambulatory Visit
Admission: RE | Admit: 2022-10-10 | Discharge: 2022-10-10 | Disposition: A | Discharge status: Home / Self Care | Payer: 59 | Attending: Urology | Admitting: Urology

## 2022-10-10 ENCOUNTER — Encounter: Payer: Self-pay | Admitting: Urology

## 2022-10-10 DIAGNOSIS — Q2112 Patent foramen ovale: Secondary | ICD-10-CM | POA: Diagnosis not present

## 2022-10-10 DIAGNOSIS — Z8673 Personal history of transient ischemic attack (TIA), and cerebral infarction without residual deficits: Secondary | ICD-10-CM | POA: Insufficient documentation

## 2022-10-10 DIAGNOSIS — J449 Chronic obstructive pulmonary disease, unspecified: Secondary | ICD-10-CM | POA: Insufficient documentation

## 2022-10-10 DIAGNOSIS — R001 Bradycardia, unspecified: Secondary | ICD-10-CM | POA: Insufficient documentation

## 2022-10-10 DIAGNOSIS — I739 Peripheral vascular disease, unspecified: Secondary | ICD-10-CM | POA: Diagnosis not present

## 2022-10-10 DIAGNOSIS — Z7902 Long term (current) use of antithrombotics/antiplatelets: Secondary | ICD-10-CM | POA: Insufficient documentation

## 2022-10-10 DIAGNOSIS — N433 Hydrocele, unspecified: Secondary | ICD-10-CM

## 2022-10-10 DIAGNOSIS — F17210 Nicotine dependence, cigarettes, uncomplicated: Secondary | ICD-10-CM | POA: Diagnosis not present

## 2022-10-10 HISTORY — DX: Long term (current) use of antithrombotics/antiplatelets: Z79.02

## 2022-10-10 HISTORY — DX: Hydrocele, unspecified: N43.3

## 2022-10-10 HISTORY — PX: HYDROCELE EXCISION: SHX482

## 2022-10-10 SURGERY — HYDROCELECTOMY
Anesthesia: General | Site: Scrotum | Laterality: Right

## 2022-10-10 MED ORDER — MIDAZOLAM HCL 2 MG/2ML IJ SOLN
INTRAMUSCULAR | Status: AC
  Filled 2022-10-10: qty 2

## 2022-10-10 MED ORDER — GLYCOPYRROLATE 0.2 MG/ML IJ SOLN
INTRAMUSCULAR | Status: AC
  Filled 2022-10-10: qty 1

## 2022-10-10 MED ORDER — ONDANSETRON HCL 4 MG/2ML IJ SOLN
INTRAMUSCULAR | Status: AC
  Filled 2022-10-10: qty 2

## 2022-10-10 MED ORDER — HYDROCODONE-ACETAMINOPHEN 5-325 MG PO TABS
1.0000 | ORAL_TABLET | Freq: Once | ORAL | Status: AC
  Administered 2022-10-10: 1 via ORAL

## 2022-10-10 MED ORDER — FAMOTIDINE 20 MG PO TABS
ORAL_TABLET | ORAL | Status: AC
  Administered 2022-10-10: 20 mg via ORAL
  Filled 2022-10-10: qty 1

## 2022-10-10 MED ORDER — OXYCODONE HCL 5 MG/5ML PO SOLN: 5.0000 mg | Freq: Once | ORAL | Status: DC | PRN

## 2022-10-10 MED ORDER — FENTANYL CITRATE (PF) 100 MCG/2ML IJ SOLN: 25.0000 ug | INTRAMUSCULAR | Status: DC | PRN

## 2022-10-10 MED ORDER — OXYCODONE HCL 5 MG PO TABS: 5.0000 mg | ORAL_TABLET | Freq: Once | ORAL | Status: DC | PRN

## 2022-10-10 MED ORDER — GLYCOPYRROLATE 0.2 MG/ML IJ SOLN
INTRAMUSCULAR | Status: DC | PRN
  Administered 2022-10-10: .1 mg via INTRAVENOUS

## 2022-10-10 MED ORDER — FENTANYL CITRATE (PF) 100 MCG/2ML IJ SOLN
INTRAMUSCULAR | Status: DC | PRN
  Administered 2022-10-10 (×2): 25 ug via INTRAVENOUS
  Administered 2022-10-10: 50 ug via INTRAVENOUS

## 2022-10-10 MED ORDER — LIDOCAINE HCL (PF) 2 % IJ SOLN
INTRAMUSCULAR | Status: AC
  Filled 2022-10-10: qty 5

## 2022-10-10 MED ORDER — LACTATED RINGERS IV SOLN: INTRAVENOUS | Status: DC

## 2022-10-10 MED ORDER — HYDROCODONE-ACETAMINOPHEN 5-325 MG PO TABS
ORAL_TABLET | ORAL | Status: AC
  Filled 2022-10-10: qty 1

## 2022-10-10 MED ORDER — ACETAMINOPHEN 10 MG/ML IV SOLN: 1000.0000 mg | Freq: Once | INTRAVENOUS | Status: DC | PRN

## 2022-10-10 MED ORDER — EPHEDRINE SULFATE (PRESSORS) 50 MG/ML IJ SOLN
INTRAMUSCULAR | Status: DC | PRN
  Administered 2022-10-10 (×2): 5 mg via INTRAVENOUS

## 2022-10-10 MED ORDER — HYDROCODONE-ACETAMINOPHEN 5-325 MG PO TABS: 1.0000 | ORAL_TABLET | Freq: Four times a day (QID) | ORAL | 0 refills | Status: DC | PRN

## 2022-10-10 MED ORDER — PROPOFOL 10 MG/ML IV BOLUS
INTRAVENOUS | Status: AC
  Filled 2022-10-10: qty 20

## 2022-10-10 MED ORDER — 0.9 % SODIUM CHLORIDE (POUR BTL) OPTIME
TOPICAL | Status: DC | PRN
  Administered 2022-10-10: 500 mL

## 2022-10-10 MED ORDER — ONDANSETRON HCL 4 MG/2ML IJ SOLN: 4.0000 mg | Freq: Once | INTRAMUSCULAR | Status: DC | PRN

## 2022-10-10 MED ORDER — BUPIVACAINE HCL (PF) 0.5 % IJ SOLN
INTRAMUSCULAR | Status: AC
  Filled 2022-10-10: qty 30

## 2022-10-10 MED ORDER — LIDOCAINE HCL (CARDIAC) PF 100 MG/5ML IV SOSY
PREFILLED_SYRINGE | INTRAVENOUS | Status: DC | PRN
  Administered 2022-10-10: 70 mg via INTRAVENOUS

## 2022-10-10 MED ORDER — EPHEDRINE 5 MG/ML INJ
INTRAVENOUS | Status: AC
  Filled 2022-10-10: qty 5

## 2022-10-10 MED ORDER — ONDANSETRON HCL 4 MG/2ML IJ SOLN
INTRAMUSCULAR | Status: DC | PRN
  Administered 2022-10-10: 4 mg via INTRAVENOUS

## 2022-10-10 MED ORDER — BUPIVACAINE HCL 0.5 % IJ SOLN
INTRAMUSCULAR | Status: DC | PRN
  Administered 2022-10-10: 10 mL

## 2022-10-10 MED ORDER — MIDAZOLAM HCL 2 MG/2ML IJ SOLN
INTRAMUSCULAR | Status: DC | PRN
  Administered 2022-10-10: 2 mg via INTRAVENOUS

## 2022-10-10 MED ORDER — CEFAZOLIN SODIUM-DEXTROSE 2-4 GM/100ML-% IV SOLN
INTRAVENOUS | Status: AC
  Filled 2022-10-10: qty 100

## 2022-10-10 MED ORDER — PROPOFOL 10 MG/ML IV BOLUS
INTRAVENOUS | Status: DC | PRN
  Administered 2022-10-10: 120 mg via INTRAVENOUS

## 2022-10-10 MED ORDER — CHLORHEXIDINE GLUCONATE 0.12 % MT SOLN
OROMUCOSAL | Status: AC
  Administered 2022-10-10: 15 mL via OROMUCOSAL
  Filled 2022-10-10: qty 15

## 2022-10-10 MED ORDER — FENTANYL CITRATE (PF) 100 MCG/2ML IJ SOLN
INTRAMUSCULAR | Status: AC
  Filled 2022-10-10: qty 2

## 2022-10-10 MED ORDER — DEXAMETHASONE SODIUM PHOSPHATE 10 MG/ML IJ SOLN
INTRAMUSCULAR | Status: DC | PRN
  Administered 2022-10-10: 8 mg via INTRAVENOUS

## 2022-10-10 MED ORDER — PHENYLEPHRINE 80 MCG/ML (10ML) SYRINGE FOR IV PUSH (FOR BLOOD PRESSURE SUPPORT)
PREFILLED_SYRINGE | INTRAVENOUS | Status: DC | PRN
  Administered 2022-10-10: 80 ug via INTRAVENOUS

## 2022-10-10 SURGICAL SUPPLY — 38 items
ADH SKN CLS APL DERMABOND .7 (GAUZE/BANDAGES/DRESSINGS) ×1
APL PRP STRL LF DISP 70% ISPRP (MISCELLANEOUS) ×1
BLADE CLIPPER SURG (BLADE) ×1 IMPLANT
BLADE SURG 15 STRL LF DISP TIS (BLADE) ×1 IMPLANT
BLADE SURG 15 STRL SS (BLADE) ×1
CHLORAPREP W/TINT 26 (MISCELLANEOUS) ×1 IMPLANT
DERMABOND ADVANCED .7 DNX12 (GAUZE/BANDAGES/DRESSINGS) ×1 IMPLANT
DRAIN PENROSE 12X.25 LTX STRL (MISCELLANEOUS) IMPLANT
DRAPE LAPAROTOMY 77X122 PED (DRAPES) ×1 IMPLANT
DRSG GAUZE FLUFF 36X18 (GAUZE/BANDAGES/DRESSINGS) ×1 IMPLANT
ELECT REM PT RETURN 9FT ADLT (ELECTROSURGICAL) ×1
ELECTRODE REM PT RTRN 9FT ADLT (ELECTROSURGICAL) ×1 IMPLANT
GAUZE 4X4 16PLY ~~LOC~~+RFID DBL (SPONGE) ×1 IMPLANT
GAUZE SPONGE 4X4 12PLY STRL (GAUZE/BANDAGES/DRESSINGS) IMPLANT
GLOVE BIO SURGEON STRL SZ 6.5 (GLOVE) ×1 IMPLANT
GOWN STRL REUS W/ TWL LRG LVL3 (GOWN DISPOSABLE) ×2 IMPLANT
GOWN STRL REUS W/TWL LRG LVL3 (GOWN DISPOSABLE) ×2
KIT TURNOVER KIT A (KITS) ×1 IMPLANT
LABEL OR SOLS (LABEL) ×1 IMPLANT
MANIFOLD NEPTUNE II (INSTRUMENTS) ×1 IMPLANT
NDL HYPO 25X1 1.5 SAFETY (NEEDLE) ×1 IMPLANT
NEEDLE HYPO 25X1 1.5 SAFETY (NEEDLE) ×1 IMPLANT
NS IRRIG 500ML POUR BTL (IV SOLUTION) ×1 IMPLANT
PACK BASIN MINOR ARMC (MISCELLANEOUS) ×1 IMPLANT
SUPPORETR ATHLETIC LG (MISCELLANEOUS) ×1 IMPLANT
SUPPORTER ATHLETIC LG (MISCELLANEOUS) ×1
SUT CHROMIC 3 0 PS 2 (SUTURE) ×2 IMPLANT
SUT CHROMIC 3 0 SH 27 (SUTURE) IMPLANT
SUT ETHILON 3-0 FS-10 30 BLK (SUTURE) ×1
SUT ETHILON NAB PS2 4-0 18IN (SUTURE) IMPLANT
SUT VIC AB 3-0 SH 27 (SUTURE) ×1
SUT VIC AB 3-0 SH 27X BRD (SUTURE) ×1 IMPLANT
SUT VIC AB 4-0 SH 27 (SUTURE) ×1
SUT VIC AB 4-0 SH 27XANBCTRL (SUTURE) IMPLANT
SUTURE EHLN 3-0 FS-10 30 BLK (SUTURE) IMPLANT
SYR 10ML LL (SYRINGE) ×1 IMPLANT
TRAP FLUID SMOKE EVACUATOR (MISCELLANEOUS) ×1 IMPLANT
WATER STERILE IRR 500ML POUR (IV SOLUTION) ×1 IMPLANT

## 2022-10-10 NOTE — Discharge Instructions (Addendum)
AMBULATORY SURGERY  ?DISCHARGE INSTRUCTIONS ? ? ?The drugs that you were given will stay in your system until tomorrow so for the next 24 hours you should not: ? ?Drive an automobile ?Make any legal decisions ?Drink any alcoholic beverage ? ? ?You may resume regular meals tomorrow.  Today it is better to start with liquids and gradually work up to solid foods. ? ?You may eat anything you prefer, but it is better to start with liquids, then soup and crackers, and gradually work up to solid foods. ? ? ?Please notify your doctor immediately if you have any unusual bleeding, trouble breathing, redness and pain at the surgery site, drainage, fever, or pain not relieved by medication. ? ? ? ?Additional Instructions: ? ? ? ?Please contact your physician with any problems or Same Day Surgery at [phone], Monday through Friday 6 am to 4 pm, or [place] at [place] Main number at [phone].  ?

## 2022-10-10 NOTE — Transfer of Care (Signed)
Immediate Anesthesia Transfer of Care Note  Patient: Raymond Lowery  Procedure(s) Performed: HYDROCELECTOMY ADULT (Right: Scrotum)  Patient Location: PACU  Anesthesia Type:General  Level of Consciousness: drowsy  Airway & Oxygen Therapy: Patient Spontanous Breathing and Patient connected to face mask oxygen  Post-op Assessment: Report given to RN  Post vital signs: stable  Last Vitals:  Vitals Value Taken Time  BP    Temp    Pulse 61 10/10/22 1429  Resp 15 10/10/22 1429  SpO2 98 % 10/10/22 1429  Vitals shown include unvalidated device data.  Last Pain:  Vitals:   10/10/22 1203  TempSrc: Temporal  PainSc: 0-No pain      Patients Stated Pain Goal: 0 (10/10/22 1203)  Complications: No notable events documented.

## 2022-10-10 NOTE — Anesthesia Postprocedure Evaluation (Signed)
Anesthesia Post Note  Patient: Antwane Grose  Procedure(s) Performed: HYDROCELECTOMY ADULT (Right: Scrotum)  Patient location during evaluation: PACU Anesthesia Type: General Level of consciousness: awake and alert, oriented and patient cooperative Pain management: pain level controlled Vital Signs Assessment: post-procedure vital signs reviewed and stable Respiratory status: spontaneous breathing, nonlabored ventilation and respiratory function stable Cardiovascular status: blood pressure returned to baseline and stable Postop Assessment: adequate PO intake Anesthetic complications: no   No notable events documented.   Last Vitals:  Vitals:   10/10/22 1430 10/10/22 1445  BP: 135/70 (!) 140/70  Pulse: 61 (!) 58  Resp: 15 15  Temp:    SpO2: 98% 99%    Last Pain:  Vitals:   10/10/22 1416  TempSrc:   PainSc: Asleep                 Reed Breech

## 2022-10-10 NOTE — Op Note (Signed)
Date of procedure: 10/10/22  Preoperative diagnosis:  Right hydrocele  Postoperative diagnosis:  Same above  Procedure: Right hydrocelectomy  Surgeon: Vanna Scotland, MD  Anesthesia: General  Complications: None  Intraoperative findings: 120 cc of clear straw-colored fluid drained, blind-ending hydrocele sac appreciated and excised  No other pathology identified.  EBL: Minimal  Specimens: None  Drains: Quarter inch Penrose in the dependent scrotum  Indication: Raymond Lowery is a 66 y.o. patient with personal history of symptomatic right hydrocele.  After reviewing the management options for treatment, he elected to proceed with the above surgical procedure(s). We have discussed the potential benefits and risks of the procedure, side effects of the proposed treatment, the likelihood of the patient achieving the goals of the procedure, and any potential problems that might occur during the procedure or recuperation. Informed consent has been obtained.  Description of procedure:  The patient was taken to the operating room and general anesthesia was induced.  The patient was placed in the supine position, prepped and draped in the usual sterile fashion, and preoperative antibiotics were administered. A preoperative time-out was performed.   Local anesthetic was used, approximately 10 cc with a deep and it hemiscrotum the site of the planned drain as well as transversely along the right hemiscrotum.  A 4 cm incision was made along the epidermal lines.  The subcutaneous tissue was opened using Bovie electrocautery around the right hydrocele sac was delivered into the field.  Additional blunt dissection was performed to remove additional layers.  The hydrocele was then incised and 130 cc of clear straw-colored fluid was drained.  The edges of the hydrocele sac were then excised.  Care was taken to ensure that the sac itself did appear to be blind-ending which was confirmed and no  communication with the abdominal cavity was appreciated.  Careful and adequate hemostasis was achieved within the hemiscrotum as well as the edges of the excised hydrocele sac oversewn using a 3-0 Vicryl.  The wound was then copiously irrigated.  A stab incision was created in the dependent scrotum and a Penrose drain was secured at this location using a drain stitch and placed within the right hemiscrotum.  The right testicle was then returned back into the dependent right testicle was then returned back into the dependent hemiscrotum with care taken to ensure its proper anatomic orientation.  Dartos was closed using a running 3-0 Vicryl suture.  4 chromic was used interrupted to close the skin.  Dermabond, scrotal fluffs 4 x 4's and ABD pad were applied.  He was then cleaned and dried, repositioned on the stretcher after being extubated and taken to the PACU in stable condition.  Plan: We will have him return on Thursday for drain removal.  Will resume his aspirin/Plavix thereafter.  Vanna Scotland, M.D.

## 2022-10-10 NOTE — Anesthesia Preprocedure Evaluation (Addendum)
Anesthesia Evaluation  Patient identified by MRN, date of birth, ID band Patient awake    Reviewed: Allergy & Precautions, NPO status , Patient's Chart, lab work & pertinent test results  History of Anesthesia Complications Negative for: history of anesthetic complications  Airway Mallampati: III   Neck ROM: Full    Dental  (+) Edentulous Upper, Edentulous Lower   Pulmonary COPD, Current Smoker (1.5 ppd) and Patient abstained from smoking.   Pulmonary exam normal breath sounds clear to auscultation       Cardiovascular + Peripheral Vascular Disease (carotid disease)  Normal cardiovascular exam+ Valvular Problems/Murmurs (PFO)  Rhythm:Regular Rate:Normal  ECG 09/19/22: Sinus bradycardia (HR 57), otherwise normal  Echo 01/26/18:  1. Normal left ventricular systolic function with an EF of 55-60 percent 2. Mild, fixed, not ulcerated atheromatous plaque in the aortic arch 3. No evidence of LAA thrombus 4. Medium sized patent foramen ovale.  Agitated saline contrast study showed a moderate right to left shunt.   Neuro/Psych Alcohol use disorder, 1-2 beers per day; last intake 2 weeks ago CVA (on Plavix)    GI/Hepatic negative GI ROS,,,(+) Hepatitis -, C  Endo/Other  negative endocrine ROS    Renal/GU      Musculoskeletal   Abdominal   Peds  Hematology negative hematology ROS (+)   Anesthesia Other Findings Reviewed and agree with Edd Fabian pre-anesthesia clinical review note.    Cardiology note 09/19/22:  1.  Peripheral arterial disease which she continues to deny claudication symptoms.  The only symptoms that he had with possible venous insufficiency symptoms of muscle cramps in the evening while he had gone to bed likely from dehydration during the summer months.  He has a known occluded distal right SFA.  Unfortunately he has increased the amount of smoking back up to 2 packs/day.  Previously he had opted to  continue with aspirin versus Plavix and now after further discussion and encouragement from his wife he states that he would prefer to be on clopidogrel 75 mg daily versus singular aspirin therapy.  She has been advised to discontinue his aspirin and restart clopidogrel 75 mg daily.  He is also been advised to continue on his statin therapy and the new cholesterol is too low as that was his previous concern.   2.  Hyperlipidemia with an LDL of 49 on 11/08/2021 which is stable at goal.  He was continued on atorvastatin 40 mg daily.  His lipid panel is followed by PCP.   3.  Carotid artery stenosis with right carotid stent consistent with 1-39% stenosis nonhemodynamically significant plaque<50% noted in the CCA, ECA<50% stenosis, left carotid ICA is consistent with 1-39% stenosis nonhemodynamically significant plaque<50% noted in the CCA, ECA<appears 50% stenosed.  Report recommend follow-up in 1 year the study was completed 04/29/2021.  Carotid duplex study ordered.   4.  COPD with continued tobacco use.  Recently diagnosed with moderate to severe COPD and is increased the amount he smokes to 2 packs/day.  He continues to follow with pulmonary.  CT of the chest did reveal lung nodules plan is to be continued with intermittent imaging.  Complete cessation is recommended that he is currently not ready.   5.  Disposition patient return to clinic to see MD in 6 months or sooner if needed.   Reproductive/Obstetrics                             Anesthesia Physical Anesthesia Plan  ASA: 3  Anesthesia Plan: General   Post-op Pain Management:    Induction: Intravenous  PONV Risk Score and Plan: 1 and Ondansetron, Dexamethasone and Treatment may vary due to age or medical condition  Airway Management Planned: LMA  Additional Equipment:   Intra-op Plan:   Post-operative Plan: Extubation in OR  Informed Consent: I have reviewed the patients History and Physical, chart, labs  and discussed the procedure including the risks, benefits and alternatives for the proposed anesthesia with the patient or authorized representative who has indicated his/her understanding and acceptance.     Dental advisory given  Plan Discussed with: CRNA  Anesthesia Plan Comments: (Patient consented for risks of anesthesia including but not limited to:  - adverse reactions to medications - damage to eyes, teeth, lips or other oral mucosa - nerve damage due to positioning  - sore throat or hoarseness - damage to heart, brain, nerves, lungs, other parts of body or loss of life  Informed patient about role of CRNA in peri- and intra-operative care.  Patient voiced understanding.)        Anesthesia Quick Evaluation

## 2022-10-10 NOTE — Interval H&P Note (Signed)
History and Physical Interval Note:  10/10/2022 1:19 PM  Raymond Lowery  has presented today for surgery, with the diagnosis of Right Hydrocele.  The various methods of treatment have been discussed with the patient and family. After consideration of risks, benefits and other options for treatment, the patient has consented to  Procedure(s): HYDROCELECTOMY ADULT (Right) as a surgical intervention.  The patient's history has been reviewed, patient examined, no change in status, stable for surgery.  I have reviewed the patient's chart and labs.  Questions were answered to the patient's satisfaction.    RRR CTAB  Vanna Scotland

## 2022-10-11 ENCOUNTER — Encounter: Payer: Self-pay | Admitting: Urology

## 2022-10-12 NOTE — Progress Notes (Unsigned)
He is status post right hydrocelectomy on October 10, 2022 with Dr. Apolinar Junes.  The 3-0 nylon securing sutures were cut with scissors and using a forceps, the suture was gently pulled away from the skin.  Then the 1/4 inch Penrose drain was gently pulled away from the insertion site.  Insertion site is then covered with ABD pad.    Incision is clean and dry.  No purulent drainage is noted.  The drain with some scant serosanguineous venous drainage.  No erythema, crepitus or fluctuant masses noted in the scrotal area.  Advised the patient that it may continue to drain is serosanguineous fluid for the next couple of days and to keep it covered.  Patient is to report any purulent drainage, increased swelling, increased pain, increased erythema, crepitus or fluctuant mass.  Patient is also advised to report any fevers over 102, chills, nausea and/or vomiting.   Reviewed return precautions.  Follow-up as scheduled on November 22, 2022 with Dr. Apolinar Junes.

## 2022-10-13 ENCOUNTER — Ambulatory Visit: Payer: 59 | Admitting: Urology

## 2022-10-13 DIAGNOSIS — N433 Hydrocele, unspecified: Secondary | ICD-10-CM

## 2022-10-13 MED ORDER — HYDROCODONE-ACETAMINOPHEN 5-325 MG PO TABS: 1.0000 | ORAL_TABLET | Freq: Four times a day (QID) | ORAL | 0 refills | Status: DC | PRN

## 2022-10-18 ENCOUNTER — Ambulatory Visit: Payer: 59 | Admitting: Cardiovascular Disease

## 2022-10-27 ENCOUNTER — Ambulatory Visit: Payer: 59 | Attending: Cardiovascular Disease

## 2022-10-27 DIAGNOSIS — I63231 Cerebral infarction due to unspecified occlusion or stenosis of right carotid arteries: Secondary | ICD-10-CM | POA: Diagnosis not present

## 2022-11-03 ENCOUNTER — Other Ambulatory Visit: Payer: Self-pay | Admitting: *Deleted

## 2022-11-03 DIAGNOSIS — I739 Peripheral vascular disease, unspecified: Secondary | ICD-10-CM

## 2022-11-22 ENCOUNTER — Ambulatory Visit: Payer: 59 | Admitting: Urology

## 2022-11-22 VITALS — BP 147/69 | HR 65 | Ht 71.0 in | Wt 154.5 lb

## 2022-11-22 DIAGNOSIS — Z87438 Personal history of other diseases of male genital organs: Secondary | ICD-10-CM | POA: Diagnosis not present

## 2022-11-22 DIAGNOSIS — N486 Induration penis plastica: Secondary | ICD-10-CM

## 2022-11-22 DIAGNOSIS — N433 Hydrocele, unspecified: Secondary | ICD-10-CM

## 2022-11-22 NOTE — Progress Notes (Signed)
I, DeAsia L Maxie,acting as a scribe for Hollice Espy, MD.,have documented all relevant documentation on the behalf of Hollice Espy, MD,as directed by  Hollice Espy, MD while in the presence of Hollice Espy, MD.   11/22/22 9:10 AM   Raymond Lowery 1955-11-19 [id number]  Referring provider: Medicine, [facility] No address on file  Chief Complaint  Patient presents with   Wound Check    Follow up    HPI: 67 year-old male who underwent right hydrocelectomy on October 10, 2022, who returns today for a wound check.  He is doing well since the procedure.   He notes his erections are not as firm as usual.  He describes what sounds like an hourglass deformity at the base of his penis which he says does not fill up as much as the rest of his penis at the time of erections.  He reports curvature to the left which he has noticed for a while now. He has not been able to encounter sexual activity due to the curvature and instability. He notes some issues with masturbation as well.  This has been going on for quite some time, does not recall any penile injury or trauma.  No penile pain.   He has not been using Sildenafil as much because of the above issue.   PMH: Past Medical History:  Diagnosis Date   Acute right MCA stroke ([place]) 10/09/2017   a.) CT head 10/09/2017: acute RIGHT MCA infarct involving the basal ganglia, RIGHT insula, and frontal operculum air cortices --> mechanical thrombectomy performed.   Aortic atheroma 01/26/2018   Carotid artery disease ([place]) 10/09/2017   a.) s/p PTA 10/09/2017 --> stenting (9-7 x 40 mm Xact) of the RICA   COPD (chronic obstructive pulmonary disease) ([place])    Hepatitis C    a.) treated with glecaprevir-pibrentasvir; follow up SVR12 viral load undetectable 09/07/2018   Hyperlipidemia    Liver fibrosis    Long term current use of antithrombotics/antiplatelets    a.) clopidogrel   PAD (peripheral artery disease) ([facility])     Known occluded distal right SFA with moderately reduced ABI   PFO (patent foramen ovale)    a.) TEE 01/26/2018: EF 55-60%, mod PFO with mod R-L shunt   Pneumonia 2012   Right hydrocele    Tobacco use     Surgical History: Past Surgical History:  Procedure Laterality Date   BACK SURGERY     neck   HYDROCELE EXCISION Right 10/10/2022   Procedure: HYDROCELECTOMY ADULT;  Surgeon: Hollice Espy, MD;  Location: [facility];  Service: Urology;  Laterality: Right;   IR ANGIO EXTRACRAN SEL COM CAROTID INNOMINATE UNI L MOD SED  10/09/2017   IR ANGIO VERTEBRAL SEL SUBCLAVIAN INNOMINATE UNI R MOD SED  10/09/2017   IR INTRAVSC STENT CERV CAROTID W/O EMB-PROT MOD SED INC ANGIO  10/09/2017   IR PERCUTANEOUS ART THROMBECTOMY/INFUSION INTRACRANIAL INC DIAG ANGIO  10/09/2017   IR RADIOLOGIST EVAL & MGMT  11/06/2017   KNEE ARTHROSCOPY Left    RADIOLOGY WITH ANESTHESIA N/A 10/09/2017   Procedure: RADIOLOGY WITH ANESTHESIA;  Surgeon: Radiologist, Medication, MD;  Location: [place];  Service: Radiology;  Laterality: N/A;   TEE WITHOUT CARDIOVERSION N/A 01/26/2018   Procedure: TRANSESOPHAGEAL ECHOCARDIOGRAM (TEE);  Surgeon: Adrian Prows, MD;  Location: [facility] ENDOSCOPY;  Service: Cardiovascular;  Laterality: N/A;    Home Medications:  Allergies as of 11/22/2022   No Known Allergies      Medication List  Accurate as of November 22, 2022  9:10 AM. If you have any questions, ask your nurse or doctor.          STOP taking these medications    HYDROcodone-acetaminophen 5-325 MG tablet Commonly known as: NORCO/VICODIN       TAKE these medications    albuterol 108 (90 Base) MCG/ACT inhaler Commonly known as: VENTOLIN HFA 1-2 puffs every 6 (six) hours as needed.   atorvastatin 40 MG tablet Commonly known as: LIPITOR TAKE 1 TABLET(40 MG) BY MOUTH DAILY AT 6 PM What changed:  how much to take how to take this when to take this   clopidogrel 75 MG tablet Commonly known as: PLAVIX Take  1 tablet (75 mg total) by mouth daily.   fluticasone 50 MCG/ACT nasal spray Commonly known as: FLONASE Place into both nostrils.        Family History: Family History  Problem Relation Age of Onset   COPD Mother    Dementia Father     Social History:  reports that he has been smoking cigarettes. He has a 60.00 pack-year smoking history. His smokeless tobacco use includes chew. He reports that he does not currently use alcohol. He reports that he does not use drugs.   Physical Exam: BP (!) 147/69   Pulse 65   Ht 5\' 11"  (1.803 m)   Wt 154 lb 8 oz (70.1 kg)   BMI 21.55 kg/m   Constitutional:  Alert and oriented, No acute distress. HEENT: [place] AT, moist mucus membranes.  Trachea midline, no masses. GU: small well healed incision over his right hemiscrotum. He's still got some fullness around his right testicle, but no fluid, no residual hydrocele appreciated or significant hematoma. He had a circumcised phallus with some fibrosis near the base midline of his penis dorsally, but no discrete Peyronie's plaque. Neurologic: Grossly intact, no focal deficits, moving all 4 extremities. Psychiatric: Normal mood and affect.  Assessment & Plan:    Hydrocele - S/p right hydrocelectomy, healing well   Peyronie's disease Based on patient's history of physical exam, findings are consistent with Peyronie's disease.  Pathophysiology was discussed at length including the 2 phases of the diease, acute and chronic.  Treatment options and goals of treatment were discussed today in detail. Options including observation, penile plaque and graft, penile plication, placement of penile prosthesis, and injection of collagenase were all reviewed.  An benefits of each were discussed at length.  - Because he has an hourglass defect and not a lot of curvature, it's unclear whether or not he would benefit from collagenase. We'll bring him in for induction of erection, measuring and photos with Larene Beach in  order to better assess his anatomic defect and whether or not he would benefit from this. He was given information about Xiaflex today.  -He was also encouraged to try sildenafil again as a full erection may allow for better penile stability and rigidity  Return for induction of erection, measuring and photos with Larene Beach.  I have reviewed the above documentation for accuracy and completeness, and I agree with the above.   Hollice Espy, MD   [facility] [address] [place], [place] [address] [phone]

## 2022-12-01 ENCOUNTER — Ambulatory Visit: Payer: 59 | Admitting: Urology

## 2022-12-05 NOTE — Progress Notes (Unsigned)
12/06/2022 9:25 AM   Raymond Lowery 12-11-1955 [id number]  Referring provider: Medicine, Raymond Lowery [facility] No address on file  Urological history: 1.  Hydrocele -Status post right hydrocelectomy on October 10, 2022  2.  Erectile dysfunction -Contributing factors of age, BPH, Peyronie's disease, stroke, PAD, COPD, smoking, anticoagulants, CAD, hyperlipidemia,  -Sildenafil 20 mg, on-demand dosing  3.  Nephrolithiasis -Inpatient record dated 2014  4. BPH w/LU TS -PSA (08/2022) 0.6   Chief Complaint  Patient presents with   Erectile Dysfunction    HPI: Raymond Lowery is a 67 y.o. male who presents today for induction of erection for plaque marking and measuring of curvature for Xiaflex injection PA.   His questions were answered to his satisfaction and he was agreeable to begin.  PMH: Past Medical History:  Diagnosis Date   Acute right MCA stroke ([place]) 10/09/2017   a.) CT head 10/09/2017: acute RIGHT MCA infarct involving the basal ganglia, RIGHT insula, and frontal operculum air cortices --> mechanical thrombectomy performed.   Aortic atheroma 01/26/2018   Carotid artery disease ([place]) 10/09/2017   a.) s/p PTA 10/09/2017 --> stenting (9-7 x 40 mm Xact) of the RICA   COPD (chronic obstructive pulmonary disease) ([place])    Hepatitis C    a.) treated with glecaprevir-pibrentasvir; follow up SVR12 viral load undetectable 09/07/2018   Hyperlipidemia    Liver fibrosis    Long term current use of antithrombotics/antiplatelets    a.) clopidogrel   PAD (peripheral artery disease) (H[facility])    Known occluded distal right SFA with moderately reduced ABI   PFO (patent foramen ovale)    a.) TEE 01/26/2018: EF 55-60%, mod PFO with mod R-L shunt   Pneumonia 2012   Right hydrocele    Tobacco use     Surgical History: Past Surgical History:  Procedure Laterality Date   BACK SURGERY     neck   HYDROCELE EXCISION Right 10/10/2022   Procedure: HYDROCELECTOMY  ADULT;  Surgeon: Hollice Espy, MD;  Location: [facility];  Service: Urology;  Laterality: Right;   IR ANGIO EXTRACRAN SEL COM CAROTID INNOMINATE UNI L MOD SED  10/09/2017   IR ANGIO VERTEBRAL SEL SUBCLAVIAN INNOMINATE UNI R MOD SED  10/09/2017   IR INTRAVSC STENT CERV CAROTID W/O EMB-PROT MOD SED INC ANGIO  10/09/2017   IR PERCUTANEOUS ART THROMBECTOMY/INFUSION INTRACRANIAL INC DIAG ANGIO  10/09/2017   IR RADIOLOGIST EVAL & MGMT  11/06/2017   KNEE ARTHROSCOPY Left    RADIOLOGY WITH ANESTHESIA N/A 10/09/2017   Procedure: RADIOLOGY WITH ANESTHESIA;  Surgeon: Radiologist, Medication, MD;  Location: [place];  Service: Radiology;  Laterality: N/A;   TEE WITHOUT CARDIOVERSION N/A 01/26/2018   Procedure: TRANSESOPHAGEAL ECHOCARDIOGRAM (TEE);  Surgeon: Adrian Prows, MD;  Location: [facility] ENDOSCOPY;  Service: Cardiovascular;  Laterality: N/A;    Home Medications:  Allergies as of 12/06/2022   No Known Allergies      Medication List        Accurate as of December 06, 2022  9:25 AM. If you have any questions, ask your nurse or doctor.          albuterol 108 (90 Base) MCG/ACT inhaler Commonly known as: VENTOLIN HFA 1-2 puffs every 6 (six) hours as needed.   atorvastatin 40 MG tablet Commonly known as: LIPITOR TAKE 1 TABLET(40 MG) BY MOUTH DAILY AT 6 PM What changed:  how much to take how to take this when to take this   clopidogrel 75 MG tablet Commonly  known as: PLAVIX Take 1 tablet (75 mg total) by mouth daily.   fluticasone 50 MCG/ACT nasal spray Commonly known as: FLONASE Place into both nostrils.        Allergies: No Known Allergies  Family History: Family History  Problem Relation Age of Onset   COPD Mother    Dementia Father     Social History:  reports that he has been smoking cigarettes. He has a 60.00 pack-year smoking history. His smokeless tobacco use includes chew. He reports that he does not currently use alcohol. He reports that he does not use  drugs.  ROS: Pertinent ROS in HPI  Physical Exam: BP (!) 149/75   Pulse 66   Ht 5\' 11"  (1.803 Lowery)   Wt 158 lb (71.7 kg)   BMI 22.04 kg/Lowery   Constitutional:  Well nourished. Alert and oriented, No acute distress. HEENT: [place] AT, moist mucus membranes.  Trachea midline Cardiovascular: No clubbing, cyanosis, or edema. Respiratory: Normal respiratory effort, no increased work of breathing. GU: No CVA tenderness.  No bladder fullness or masses.  Patient with circumcised phallus.    Urethral meatus is patent.  No penile discharge. No penile lesions or rashes. Scrotum without lesions, cysts, rashes and/or edema.   Neurologic: Grossly intact, no focal deficits, moving all 4 extremities. Psychiatric: Normal mood and affect.  Laboratory Data: Component     Latest Ref Rng 09/20/2022  Prostate Specific Ag, Serum     0.0 - 4.0 ng/mL 0.6     Hemoglobin A1c 4.8 - 5.6 % 5.2  Resulting Agency  [facility]   Specimen Collected: 08/30/22 17:08   Performed by: [facility] Last Resulted: 08/30/22 17:08  Received From: [facility]  Result Received: 08/31/22 16:12  I have reviewed the labs.  Procedure  Patient's left corpus cavernosum is identified.  An area near the base of the penis is cleansed with rubbing alcohol.  Careful to avoid the dorsal vein, 0.5 of Caverject 20 mcg Lot # [id number] exp # 2025 DEC 31 is injected at a 90 degree angle into the right corpus cavernosum near the base of the penis.  Patient experienced a firm erection in 15 minutes.    The erection was not firm enough to demonstrate curvature, so I injected 0.25 of the Caverject into the right corpus cavernosum.     Pertinent Imaging:       Assessment & Plan:    1. Peyronie's disease -Curvature demonstrated at 40 degrees ventrally  No follow-ups on file.  These notes generated with voice recognition software. I apologize for typographical errors.  [place], [place] [address]  [place],  [id number] [phone]

## 2022-12-06 ENCOUNTER — Ambulatory Visit: Payer: 59 | Admitting: Urology

## 2022-12-06 ENCOUNTER — Encounter: Payer: Self-pay | Admitting: Urology

## 2022-12-06 VITALS — BP 149/75 | HR 66 | Ht 71.0 in | Wt 158.0 lb

## 2022-12-06 DIAGNOSIS — N486 Induration penis plastica: Secondary | ICD-10-CM

## 2022-12-08 ENCOUNTER — Telehealth: Payer: Self-pay | Admitting: Family Medicine

## 2022-12-08 NOTE — Telephone Encounter (Signed)
Nori Riis, PA-C sent to Kyra Manges, CMA Would you check on a PA for Xiaflex for him?  Spoke to patient and he is wanting to hold off on getting Xiaflex for now. He states he will call back and let us know when he is ready to have the injections.

## 2023-03-01 ENCOUNTER — Telehealth: Payer: Self-pay | Admitting: Cardiovascular Disease

## 2023-03-01 MED ORDER — CLOPIDOGREL BISULFATE 75 MG PO TABS: 75.0000 mg | ORAL_TABLET | Freq: Every day | ORAL | 0 refills | Status: DC

## 2023-03-01 NOTE — Telephone Encounter (Signed)
*  STAT* If patient is at the pharmacy, call can be transferred to refill team.   1. Which medications need to be refilled? (please list name of each medication and dose if known) clopidogrel (PLAVIX) 75 MG tablet   2. Which pharmacy/location (including street and city if local pharmacy) is medication to be sent to? [id number] - [place], [place] - [address]   3. Do they need a 30 day or 90 day supply? 90 Day Supply  Pt is currently out of this medication.

## 2023-03-01 NOTE — Telephone Encounter (Signed)
Requested Prescriptions   Signed Prescriptions Disp Refills  . clopidogrel (PLAVIX) 75 MG tablet 90 tablet 0    Sig: Take 1 tablet (75 mg total) by mouth daily.    Authorizing Provider: ARIDA, MUHAMMAD A    Ordering User: NEWCOMER MCCLAIN, Melaina Howerton L    

## 2023-03-02 ENCOUNTER — Other Ambulatory Visit (HOSPITAL_COMMUNITY): Payer: Self-pay | Admitting: Interventional Radiology

## 2023-03-02 DIAGNOSIS — I639 Cerebral infarction, unspecified: Secondary | ICD-10-CM

## 2023-03-02 DIAGNOSIS — I671 Cerebral aneurysm, nonruptured: Secondary | ICD-10-CM

## 2023-03-14 ENCOUNTER — Telehealth: Payer: Self-pay | Admitting: Cardiovascular Disease

## 2023-03-14 DIAGNOSIS — I63231 Cerebral infarction due to unspecified occlusion or stenosis of right carotid arteries: Secondary | ICD-10-CM

## 2023-03-14 MED ORDER — ATORVASTATIN CALCIUM 40 MG PO TABS: ORAL_TABLET | ORAL | 0 refills | Status: DC

## 2023-03-14 NOTE — Telephone Encounter (Signed)
Requested Prescriptions   Signed Prescriptions Disp Refills   atorvastatin (LIPITOR) 40 MG tablet 90 tablet 0    Sig: TAKE 1 TABLET(40 MG) BY MOUTH DAILY AT 6 PM    Authorizing Provider: HAMMOCK, SHERI    Ordering User: NEWCOMER MCCLAIN, Shamecca Whitebread L

## 2023-03-14 NOTE — Telephone Encounter (Signed)
*  STAT* If patient is at the pharmacy, call can be transferred to refill team.   1. Which medications need to be refilled? (please list name of each medication and dose if known) atorvastatin (LIPITOR) 40 MG tablet   2. Which pharmacy/location (including street and city if local pharmacy) is medication to be sent to?[id number] - [place], [place] - [address]   3. Do they need a 30 day or 90 day supply? 90 Day Supply  Pt only has two tablets left.

## 2023-03-20 ENCOUNTER — Ambulatory Visit (HOSPITAL_COMMUNITY): Payer: 59

## 2023-03-20 ENCOUNTER — Encounter (HOSPITAL_COMMUNITY): Payer: Self-pay

## 2023-03-20 ENCOUNTER — Ambulatory Visit (HOSPITAL_COMMUNITY)
Admission: RE | Admit: 2023-03-20 | Discharge: 2023-03-20 | Disposition: A | Discharge status: Home / Self Care | Payer: 59 | Source: Ambulatory Visit | Attending: Interventional Radiology | Admitting: Interventional Radiology

## 2023-03-20 DIAGNOSIS — I639 Cerebral infarction, unspecified: Secondary | ICD-10-CM

## 2023-03-20 DIAGNOSIS — I671 Cerebral aneurysm, nonruptured: Secondary | ICD-10-CM

## 2023-03-22 ENCOUNTER — Ambulatory Visit: Payer: 59 | Admitting: Cardiovascular Disease

## 2023-05-28 ENCOUNTER — Other Ambulatory Visit: Payer: Self-pay | Admitting: Cardiovascular Disease

## 2023-05-31 ENCOUNTER — Ambulatory Visit: Payer: 59 | Attending: Cardiovascular Disease | Admitting: Cardiovascular Disease

## 2023-05-31 ENCOUNTER — Encounter: Payer: Self-pay | Admitting: Cardiovascular Disease

## 2023-05-31 VITALS — BP 100/60 | HR 62 | Ht 71.0 in | Wt 152.0 lb

## 2023-05-31 DIAGNOSIS — E785 Hyperlipidemia, unspecified: Secondary | ICD-10-CM | POA: Diagnosis not present

## 2023-05-31 DIAGNOSIS — Z720 Tobacco use: Secondary | ICD-10-CM

## 2023-05-31 DIAGNOSIS — I739 Peripheral vascular disease, unspecified: Secondary | ICD-10-CM

## 2023-05-31 DIAGNOSIS — I6523 Occlusion and stenosis of bilateral carotid arteries: Secondary | ICD-10-CM

## 2023-05-31 DIAGNOSIS — J449 Chronic obstructive pulmonary disease, unspecified: Secondary | ICD-10-CM

## 2023-05-31 NOTE — Patient Instructions (Signed)
Medication Instructions:  No changes *If you need a refill on your cardiac medications before your next appointment, please call your pharmacy*   Lab Work: Your provider would like for you to have following labs drawn today CMET, CBC, TSH and Lipid.   If you have labs (blood work) drawn today and your tests are completely normal, you will receive your results only by: MyChart Message (if you have MyChart) OR A paper copy in the mail If you have any lab test that is abnormal or we need to change your treatment, we will call you to review the results.   Testing/Procedures: CT Angiography (CTA) chest/aorta, is a special type of CT scan that uses a computer to produce multi-dimensional views of major blood vessels throughout the body. In CT angiography, a contrast material is injected through an IV to help visualize the blood vessels  Nothing to eat or drink 4 hours prior to test  [facility] [address]  [place], [place] [address]    Follow-Up: At [facility], you and your health needs are our priority.  As part of our continuing mission to provide you with exceptional heart care, we have created designated Provider Care Teams.  These Care Teams include your primary Cardiologist (physician) and Advanced Practice Providers (APPs -  Physician Assistants and Nurse Practitioners) who all work together to provide you with the care you need, when you need it.  We recommend signing up for the patient portal called "MyChart".  Sign up information is provided on this After Visit Summary.  MyChart is used to connect with patients for Virtual Visits (Telemedicine).  Patients are able to view lab/test results, encounter notes, upcoming appointments, etc.  Non-urgent messages can be sent to your provider as well.   To learn more about what you can do with MyChart, go to [email or web address].    Your next appointment:   6 month(s)  Provider:    You may see [name], MD or one of the following Advanced Practice Providers on your designated Care Team:   Nicolasa Ducking, NP Eula Listen, PA-C Cadence Fransico Michael, PA-C Charlsie Quest, NP    Other Instructions A referral has been placed to Pulmonology. They will call you to set up the appointment

## 2023-05-31 NOTE — Progress Notes (Signed)
Cardiology Office Note   Date:  05/31/2023   ID:  Raymond Lowery, DOB 1956-01-25, MRN [id number]  PCP:  Medicine, [facility]  Cardiologist:  [name], MD   Chief Complaint  Patient presents with   Follow-up    6 month f/u c/o losing weight. Meds reviewed verbally with pt.      History of Present Illness: Raymond Lowery is a 67 y.o. male who is here today for follow-up visit regarding  peripheral arterial disease.   He has chronic medical conditions that include hypertension, hyperlipidemia and tobacco use. The patient was hospitalized in December 2018 with right MCA stroke due to right MCA occlusion from suspected embolism from proximal right ICA stenosis.  He was treated successfully with mechanical embolectomy and rescue ICA stenting. Echocardiogram showed normal LV systolic function with no significant valvular abnormalities.  TEE showed PFO with right-to-left shunt with Valsalva.  His PFO was not felt to be the source of his stroke.  He is being followed for peripheral arterial disease with known occluded right distal SFA with moderately reduced ABI. He denies claudication at this time.  No chest pain.  He does report worsening dyspnea but he continues to smoke 2-1/2 pack/day.  In addition, he drinks at least 4 beers a day.  He has been losing weight over the last few months with poor appetite.  Past Medical History:  Diagnosis Date   Acute right MCA stroke (HCC) 10/09/2017   a.) CT head 10/09/2017: acute RIGHT MCA infarct involving the basal ganglia, RIGHT insula, and frontal operculum air cortices --> mechanical thrombectomy performed.   Aortic atheroma 01/26/2018   Carotid artery disease (HCC) 10/09/2017   a.) s/p PTA 10/09/2017 --> stenting (9-7 x 40 mm Xact) of the RICA   COPD (chronic obstructive pulmonary disease) (HCC)    Hepatitis C    a.) treated with glecaprevir-pibrentasvir; follow up SVR12 viral load undetectable 09/07/2018    Hyperlipidemia    Liver fibrosis    Long term current use of antithrombotics/antiplatelets    a.) clopidogrel   PAD (peripheral artery disease) (HCC)    Known occluded distal right SFA with moderately reduced ABI   PFO (patent foramen ovale)    a.) TEE 01/26/2018: EF 55-60%, mod PFO with mod R-L shunt   Pneumonia 2012   Right hydrocele    Tobacco use     Past Surgical History:  Procedure Laterality Date   BACK SURGERY     neck   HYDROCELE EXCISION Right 10/10/2022   Procedure: HYDROCELECTOMY ADULT;  Surgeon: Vanna Scotland, MD;  Location: [facility] ORS;  Service: Urology;  Laterality: Right;   IR ANGIO EXTRACRAN SEL COM CAROTID INNOMINATE UNI L MOD SED  10/09/2017   IR ANGIO VERTEBRAL SEL SUBCLAVIAN INNOMINATE UNI R MOD SED  10/09/2017   IR INTRAVSC STENT CERV CAROTID W/O EMB-PROT MOD SED INC ANGIO  10/09/2017   IR PERCUTANEOUS ART THROMBECTOMY/INFUSION INTRACRANIAL INC DIAG ANGIO  10/09/2017   IR RADIOLOGIST EVAL & MGMT  11/06/2017   KNEE ARTHROSCOPY Left    RADIOLOGY WITH ANESTHESIA N/A 10/09/2017   Procedure: RADIOLOGY WITH ANESTHESIA;  Surgeon: Radiologist, Medication, MD;  Location: [facility] OR;  Service: Radiology;  Laterality: N/A;   TEE WITHOUT CARDIOVERSION N/A 01/26/2018   Procedure: TRANSESOPHAGEAL ECHOCARDIOGRAM (TEE);  Surgeon: Yates Decamp, MD;  Location: [facility];  Service: Cardiovascular;  Laterality: N/A;     Current Outpatient Medications  Medication Sig Dispense Refill   albuterol (VENTOLIN HFA) 108 (  90 Base) MCG/ACT inhaler 1-2 puffs every 6 (six) hours as needed.     atorvastatin (LIPITOR) 40 MG tablet TAKE 1 TABLET(40 MG) BY MOUTH DAILY AT 6 PM 90 tablet 0   clopidogrel (PLAVIX) 75 MG tablet TAKE 1 TABLET(75 MG) BY MOUTH DAILY 90 tablet 0   fluticasone (FLONASE) 50 MCG/ACT nasal spray Place into both nostrils. (Patient not taking: Reported on 05/31/2023)     No current facility-administered medications for this visit.    Allergies:   Patient has no known  allergies.    Social History:  The patient  reports that he has been smoking cigarettes. He has a 60 pack-year smoking history. His smokeless tobacco use includes chew. He reports that he does not currently use alcohol. He reports that he does not use drugs.   Family History:  The patient's family history includes COPD in his mother; Dementia in his father.    ROS:  Please see the history of present illness.   Otherwise, review of systems are positive for none.   All other systems are reviewed and negative.    PHYSICAL EXAM: VS:  BP 100/60 (BP Location: Left Arm, Patient Position: Sitting, Cuff Size: Normal)   Pulse 62   Ht 5\' 11"  (1.803 m)   Wt 152 lb (68.9 kg)   SpO2 97%   BMI 21.20 kg/m  , BMI Body mass index is 21.2 kg/m. GEN: Well nourished, well developed, in no acute distress  HEENT: normal  Neck: no JVD, carotid bruits, or masses Cardiac: RRR; no murmurs, rubs, or gallops,no edema Respiratory:  clear to auscultation bilaterally, normal work of breathing GI: soft, nontender, nondistended, + BS MS: no deformity or atrophy  Skin: warm and dry, no rash Neuro:  Strength and sensation are intact Psych: euthymic mood, full affect    EKG:  EKG is ordered today. The ekg ordered today demonstrates: Normal sinus rhythm Nonspecific ST abnormality When compared with ECG of 24-Nov-2017 02:22, No significant change was found    Recent Labs: No results found for requested labs within last 365 days.    Lipid Panel    Component Value Date/Time   CHOL 108 01/14/2020 0853   TRIG 68 01/14/2020 0853   HDL 43 01/14/2020 0853   CHOLHDL 2.5 01/14/2020 0853   CHOLHDL 2.8 04/25/2018 1550   VLDL 25 10/10/2017 0337   LDLCALC 50 01/14/2020 0853   LDLCALC 43 04/25/2018 1550      Wt Readings from Last 3 Encounters:  05/31/23 152 lb (68.9 kg)  12/06/22 158 lb (71.7 kg)  11/22/22 154 lb 8 oz (70.1 kg)         10/04/2018    4:13 PM  PAD Screen  Previous PAD dx? Yes   Previous surgical procedure? No  Pain with walking? Yes  Subsides with rest? Yes  Feet/toe relief with dangling? No  Painful, non-healing ulcers? No  Extremities discolored? No      ASSESSMENT AND PLAN:  1.  Peripheral arterial disease : He has known occluded right SFA but his claudication symptoms gradually improved and he is currently asymptomatic.  Continue clopidogrel 75 mg once daily.  2.  Tobacco use: I discussed with him the importance of smoking cessation.   3.  Hyperlipidemia: Continue treatment with atorvastatin.  I requested a follow-up lipid and liver profile.  4.  Unintentional weight loss: Concerning for underlying malignancy given his extensive smoking.  I requested CT scan of the chest for cancer screening and will refer him  back to pulmonary for evaluation of this as well as treatment of underlying COPD. Will also obtain routine labs on him.  5.  Carotid artery disease: Most recent carotid Doppler showed patent stent with no significant restenosis.   Overall, I am concerned about his continued on healthy lifestyle and this was discussed with him and his wife.   Disposition:   FU with me in 6 months     Signed, [name], MD 05/31/23

## 2023-06-12 ENCOUNTER — Other Ambulatory Visit: Payer: Self-pay | Admitting: Cardiology

## 2023-06-12 DIAGNOSIS — I63231 Cerebral infarction due to unspecified occlusion or stenosis of right carotid arteries: Secondary | ICD-10-CM

## 2023-06-21 ENCOUNTER — Ambulatory Visit: Payer: 59 | Admitting: Emergency Medicine

## 2023-06-21 ENCOUNTER — Encounter: Payer: Self-pay | Admitting: Emergency Medicine

## 2023-06-21 VITALS — BP 120/62 | HR 63 | Ht 71.0 in | Wt 153.8 lb

## 2023-06-21 DIAGNOSIS — J449 Chronic obstructive pulmonary disease, unspecified: Secondary | ICD-10-CM | POA: Diagnosis not present

## 2023-06-21 DIAGNOSIS — Z720 Tobacco use: Secondary | ICD-10-CM | POA: Diagnosis not present

## 2023-06-21 DIAGNOSIS — R9389 Abnormal findings on diagnostic imaging of other specified body structures: Secondary | ICD-10-CM

## 2023-06-21 NOTE — Assessment & Plan Note (Signed)
History of pulmonary nodular disease as well as some mild left pleural thickening and associated calcification.  He has continued to participate in lung cancer screening, most recent study was in December 2023 with [facility].  I have the report available but not the images.  This was stable with unchanged pulmonary nodular disease.  He needs a follow-up in December 2024 and we will arrange for this.  It was actually already ordered by his cardiologist Dr Kirke Corin

## 2023-06-21 NOTE — Patient Instructions (Signed)
We were able to review your lung cancer screening CT chest from December 2023.  This is stable compared with priors.  Good news.  You need to another CT chest and December 2024. Continue to work on decreasing your cigarettes.  Goal would be to get down to 1 pack daily by your next visit. Please start Spiriva Respimat 2 puffs once daily.  Keep track of how this medication helps you.  If you benefit please call our office so we can send a prescription to your pharmacy. Keep your albuterol available use 2 puffs when needed for shortness of breath, chest tightness, wheezing. Follow with Dr. Delton Coombes in December 2024 after your lung cancer screening CT scan of the chest is performed.  We will review at that time.

## 2023-06-21 NOTE — Assessment & Plan Note (Signed)
Documented obstruction on pulmonary function testing.  Overall he has a relatively good functional capacity but he does have daily cough and has noted some exertional shortness of breath.  I think will be reasonable to start him on Spiriva to see if he gets benefit.  He will message [place] to let us know if he would like [place] to send this to his pharmacy based on improvement   Continue to work on decreasing your cigarettes.  Goal would be to get down to 1 pack daily by your next visit. Please start Spiriva Respimat 2 puffs once daily.  Keep track of how this medication helps you.  If you benefit please call our office so we can send a prescription to your pharmacy. Keep your albuterol available use 2 puffs when needed for shortness of breath, chest tightness, wheezing. Follow with Dr. Delton Coombes in December 2024 after your lung cancer screening CT scan of the chest is performed.  We will review at that time.

## 2023-06-21 NOTE — Assessment & Plan Note (Signed)
Still smoking 1.5 packs/day.  Discussed the benefits of cutting down.  He will try to get down to 1 pack daily

## 2023-06-21 NOTE — Progress Notes (Signed)
Subjective:    Patient ID: Raymond Lowery, male    DOB: 04/12/1956, 67 y.o.   MRN: [id number]  HPI 67 year old man with history tobacco use (40 pack years), carotid disease, hyperlipidemia, peripheral vascular disease with CVA, PFO. Presumed COPD / asthma.  Former heavy alcohol use, not currently. Had COVID 08/2020.  Referred today for abnormal CT chest and SOB.   He was working in the heat in July and had an episode of lightheadedness, weakness.  He was evaluated, was covid negative, had an elevated d-dimer so he underwent CT chest at [facility] 05/25/21. Per pt and wife the scan showed some ILD, pulmonary nodules, ? Size. He has albuterol but uses rarely. It does help him. He does have some wheezes. Coughs daily, dry cough, no sputum, no blood.   ROV 08/10/21 --follow-up visit for 67 year old man with a history of tobacco use and presumed COPD.  Continues to smoke 2 packs daily.  Also with a history of pulmonary nodular disease based on CT scan of the chest performed at [facility].  He had PFT as below.  He has been experiencing more cough and congestion for the last week. Clear mucous. Exertional SOB unchanged.   CT scan of the chest done on 05/25/2021 and reviewed by me showed mild left pleural thickening with some focal calcification, small punctate right-sided pulmonary nodules.  Both are stable compared with a CT chest that was done on 02/14/2013.  Function testing performed on 07/12/2021 reviewed by me, shows moderately severe obstruction without a bronchodilator response, hyperinflated lung volumes, decreased diffusion capacity.  ROV 06/21/2023 --Raymond Lowery is 71 and has a history of heavy tobacco use and COPD.  I seen him for this as well as pulmonary nodular disease on CT chest.  This was stable going back on serial imaging Today he reports that he had a LDCT in 09/2022 in [place] on [address] that was reassuring. I don't have that film Still smoking 1.5 pk/day.  Using  albuterol as needed. Once a month.  Occasional SOB w exertion, lot of walking, gardening. Some am cough, non-productive.    Review of Systems As per HPI     Objective:   Physical Exam Vitals:   06/21/23 1120  BP: 120/62  Pulse: 63  SpO2: 96%  Weight: 153 lb 12.8 oz (69.8 kg)  Height: 5\' 11"  (1.803 m)    Gen: Pleasant, well-nourished, in no distress,  normal affect  ENT: No lesions,  mouth clear,  oropharynx clear, no postnasal drip  Neck: No JVD, no stridor  Lungs: No use of accessory muscles, no crackles or wheezing on normal respiration, no wheeze on forced expiration  Cardiovascular: RRR, heart sounds normal, no murmur or gallops, no peripheral edema  Musculoskeletal: No deformities, no cyanosis or clubbing  Neuro: alert, awake, non focal  Skin: Warm, no lesions or rash      Assessment & Plan:   Abnormal CT of the chest History of pulmonary nodular disease as well as some mild left pleural thickening and associated calcification.  He has continued to participate in lung cancer screening, most recent study was in December 2023 with [facility].  I have the report available but not the images.  This was stable with unchanged pulmonary nodular disease.  He needs a follow-up in December 2024 and we will arrange for this.  It was actually already ordered by his cardiologist Dr Kirke Corin  COPD (chronic obstructive pulmonary disease) (HCC) Documented obstruction on pulmonary function testing.  Overall  he has a relatively good functional capacity but he does have daily cough and has noted some exertional shortness of breath.  I think will be reasonable to start him on Spiriva to see if he gets benefit.  He will message [place] to let us know if he would like [place] to send this to his pharmacy based on improvement   Continue to work on decreasing your cigarettes.  Goal would be to get down to 1 pack daily by your next visit. Please start Spiriva Respimat 2 puffs once daily.  Keep track of how  this medication helps you.  If you benefit please call our office so we can send a prescription to your pharmacy. Keep your albuterol available use 2 puffs when needed for shortness of breath, chest tightness, wheezing. Follow with Dr. Delton Coombes in December 2024 after your lung cancer screening CT scan of the chest is performed.  We will review at that time.  Tobacco abuse Still smoking 1.5 packs/day.  Discussed the benefits of cutting down.  He will try to get down to 1 pack daily   Levy Pupa, MD, PhD 06/21/2023, 1:06 PM [facility] [phone] or if no answer before 7:00PM call [phone] For any issues after 7:00PM please call eLink [phone]

## 2023-08-26 ENCOUNTER — Other Ambulatory Visit: Payer: Self-pay | Admitting: Cardiovascular Disease

## 2023-09-06 ENCOUNTER — Telehealth: Payer: Self-pay | Admitting: Cardiovascular Disease

## 2023-09-06 NOTE — Telephone Encounter (Signed)
Forms placed up front for the patient

## 2023-09-06 NOTE — Telephone Encounter (Signed)
The forms the patient dropped off were for his neurologist. They are wanting a clearance from a neuro aspect not cardiology.

## 2023-09-06 NOTE — Telephone Encounter (Signed)
Patient dropped DOT paperwork placed in box

## 2023-09-15 ENCOUNTER — Other Ambulatory Visit: Payer: Self-pay | Admitting: Cardiology

## 2023-09-15 DIAGNOSIS — I63231 Cerebral infarction due to unspecified occlusion or stenosis of right carotid arteries: Secondary | ICD-10-CM

## 2023-11-28 ENCOUNTER — Ambulatory Visit: Payer: 59 | Attending: Cardiology | Admitting: Cardiology

## 2023-11-28 NOTE — Progress Notes (Deleted)
  Cardiology Office Note:  .   Date:  11/28/2023  ID:  Raymond Lowery, DOB May 28, 1956, MRN [id number] PCP: Chyrl Civatte, FNP  [facility] Providers Cardiologist:  [name], MD { Click to update primary MD,subspecialty MD or APP then REFRESH:1}   History of Present Illness: .   Raymond Lowery is a 68 y.o. male with past medical history of peripheral arterial disease, hyperlipidemia, tobacco abuse, bilateral carotid stenosis, COPD, hypertension, and prior CVA, who is here today for follow-up.   Previously patient was hospitalized in December 2018 with a right MCA stroke due to right MCA occlusion from suspected embolism from proximal right ICA stenosis.  He was treated successfully with mechanical embolectomy and rescue ICA stenting.  Echocardiogram at that time revealed normal LV systolic function with no significant valvular abnormalities.  TTE showed PFO with right-to-left shunt with Valsalva.  His PFO was not felt to be a source of his stroke.   He was last seen in clinic 05/21/2023 by Dr.Arida.  He had been followed for his peripheral arterial disease with known occluded right distal SFA with moderately reduced ABI.  He has not had claudication symptoms at the time.  He did report worsening dyspnea but continues to smoke 2 and half packs per day.  He had also endorsed drinking at least 4 beers a day.  He was concerned that he was losing weight over the last few months with poor appetite.  With his unintentional weight loss states he was scheduled for CT scan of the chest for cancer screening.  Unfortunately the scan was never completed.  Per chart review he did have a CT chest for lung screening completed 10/05/2022 which revealed no suspicious pulmonary nodules and recommended continued annual screening with low-dose CT.  He returns to clinic today  ROS: 10 point review of systems has been reviewed and considered negative with exception was been listed in HPI  Studies  Reviewed: .       Carotid Duplex 10/27/2022 Summary:  Right Carotid: Patent carotid stent with no stenosis.   Left Carotid: Velocities in the left ICA are consistent with a 1-39%  stenosis.   Vertebrals: Bilateral vertebral arteries demonstrate antegrade flow.  Subclavians: Normal flow hemodynamics were seen in bilateral subclavian               arteries.   CTA chest lung cancer screening 10/05/2022 ([facility]) IMPRESSION:  No suspicious pulmonary nodules.   RECOMMENDATIONS:  Continue annual Screening low dose chest CT (exam code [id number] [phone]).  Risk Assessment/Calculations:     No BP recorded.  {Refresh Note OR Click here to enter BP  :1}***       Physical Exam:   VS:  There were no vitals taken for this visit.   Wt Readings from Last 3 Encounters:  06/21/23 153 lb 12.8 oz (69.8 kg)  05/31/23 152 lb (68.9 kg)  12/06/22 158 lb (71.7 kg)    GEN: Well nourished, well developed in no acute distress NECK: No JVD; No carotid bruits CARDIAC: ***RRR, no murmurs, rubs, gallops RESPIRATORY:  Clear to auscultation without rales, wheezing or rhonchi  ABDOMEN: Soft, non-tender, non-distended EXTREMITIES:  No edema; No deformity   ASSESSMENT AND PLAN: .   ***    {Are you ordering a CV Procedure (e.g. stress test, cath, DCCV, TEE, etc)?   Press F2        :[id number]}  Dispo: ***  Signed, Anh Mangano, NP

## 2023-11-29 ENCOUNTER — Telehealth: Payer: Self-pay | Admitting: Adult Health

## 2023-11-29 NOTE — Telephone Encounter (Signed)
Pt called to verify appointment

## 2023-11-30 NOTE — Progress Notes (Signed)
Cardiology Clinic Note   Date: 12/01/2023 ID: Raymond Lowery, DOB [birth year], MRN [id number]  Primary Cardiologist:  [name], MD  Chief Complaint   Raymond Lowery is a 68 y.o. male who presents to the clinic today for routine follow up of PAD.   Patient Profile   Raymond Lowery is followed by Dr. Kirke Corin for the history outlined below.      Past medical history significant for: PAD. Vascular ultrasound lower extremities 11/02/2018: Moderate right lower extremity arterial disease.  Mild left lower extremity arterial disease. Carotid artery disease. Right ICA stent December 2018. Carotid duplex 10/27/2022: Patent carotid stent with no stenosis on the right.  Left ICA 1 to 39%. CVA. Right MCA December 2018. TEE 01/26/2018: Interatrial septum with moderate-sized PFO with moderately positive right-to-left shunting by double contrast with Valsalva strain. Hyperlipidemia. Lipid panel 05/31/2023: LDL 39, HDL 40, TG 92, total 97. COPD. Tobacco abuse.  In summary, patient was hospitalized in December 2018 with right MCA stroke due to right MCA occlusion with suspected embolism from proximal right ICA with stenosis.  He was treated successfully with mechanical embolectomy and rescue ICA stenting.  TEE March 2019 showed PFO as detailed above.  It was felt his PFO was not the source of his stroke.  He was first evaluated by Dr. Kirke Corin on 10/04/2018 for PAD as a self-referral for second opinion.  He complains of right greater than left claudication. LEA March 2019 showed right mid SFA shows dampened monophasic waveforms suggesting near occlusion, no hemodynamically significant stenosis left lower extremity with diffuse mixed plaque.  Upon follow-up in March 2020 patient declined angiography at possible intervention as he did not feel his symptoms were limiting enough.  Medical management was discussed.  Patient was last seen in the office by Dr. Kirke Corin on 05/31/2023 for routine follow-up.   He denied claudication at that time.  He complained of unintentional weight loss concerning for underlying malignancy given his extensive tobacco abuse history.  CT chest for cancer screening was ordered and he was referred to pulmonary.     History of Present Illness    Today, patient is accompanied by his wife. He reports he was flu positive about 1 month ago. He was treated with tamiflu and antibiotic. He was seen by urgent care a couple of weeks after diagnosis and was treated with a z-pack and oral steroids. Since that time he has continued to have a lot of nasal drainage. His wife was concerned about his BP being on the lower side and asked for orthostatics to be performed which were normal. He is very concerned about his unintentional weight loss. He is down 5 lb more since August despite eating more. He had discussed this with Dr. Kirke Corin at his last visit in July and a CT chest was ordered but he was unable to get it covered by insurance because his last CT was in December 2023. He is now 1  year out from his last CT and would like to know if he can get that scheduled. He was evaluated by pulmonology and it was felt he could wait until December to have repeat CT. Patient denies shortness of breath, dyspnea on exertion, lower extremity edema, orthopnea or PND. No chest pain, pressure, or tightness. No palpitations.  He denies claudication. His legs will get tired if he walks an longer distance but it improves after a couple of minutes of rest. 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      ROS: All other systems reviewed and are otherwise negative except as noted in History of Present Illness.  EKGs/Labs Reviewed    EKG Interpretation Date/Time:  Friday December 01 2023 15:05:07 EST Ventricular Rate:  63 PR Interval:  146 QRS Duration:  92 QT Interval:  390 QTC Calculation: 399 R Axis:   65  Text  Interpretation: Normal sinus rhythm Normal ECG Nonspecific ST abnormality When compared with ECG of 31-May-2023 08:54, No significant change was found Confirmed by Carlos Levering [phone]) on 12/01/2023 3:13:42 PM   05/31/2023: ALT 14; AST 15; BUN 8; Creatinine, Ser 0.84; Potassium 4.2; Sodium 139   05/31/2023: Hemoglobin 14.5; WBC 8.5   05/31/2023: TSH 1.610         Physical Exam    VS:  BP (!) 110/50 (BP Location: Left Arm, Patient Position: Sitting, Cuff Size: Normal)   Pulse 63   Ht 5\' 11"  (1.803 m)   Wt 148 lb 6.4 oz (67.3 kg)   SpO2 96%   BMI 20.70 kg/m  , BMI Body mass index is 20.7 kg/m.  Orthostatic VS for the past 24 hrs (Last 3 readings):  BP- Lying Pulse- Lying BP- Sitting Pulse- Sitting BP- Standing at 0 minutes Pulse- Standing at 0 minutes BP- Standing at 3 minutes Pulse- Standing at 3 minutes  12/01/23 1625 111/67 62 112/68 65 109/65 69 105/65 71     GEN: Well nourished, well developed, in no acute distress. Neck: No JVD or carotid bruits. Cardiac:  RRR. No murmurs. No rubs or gallops.   Respiratory:  Respirations regular and unlabored. Rhonchi bilateral upper lungs that clears with cough, diffuse wheezing, diminished breath sounds at bases bilaterally.  GI: Soft, nontender, nondistended. Extremities: Radials/DP/PT 2+ and equal bilaterally. No clubbing or cyanosis. No edema   Skin: Warm and dry, no rash. Neuro: Strength intact.  Assessment & Plan   PAD/carotid artery disease S/p right ICA stent December 2018.  Carotid duplex December 2023 showed patent carotid stent with no stenosis on the right and left ICA 1 to 39%.  Moderate right lower extremity arterial disease and mild left lower extremity arterial disease.  Patient denies claudication. His legs will get tired if he walks a longer distance but it resolves with a couple of minutes of rest. He denies lightheadedness, dizziness, presyncope or syncope.  -Continue Plavix, atorvastatin. -Schedule carotid duplex  for annual surveillance.  Hyperlipidemia LDL July 2024 39, at goal. -Continue atorvastatin.  Tobacco abuse Patient smokes 2 packs of cigarettes a day.  -Complete cessation is encouraged.   Unintentional weight loss/COPD Patient has noted weight loss over the last year. He discussed this at his last visit and there was concern for lung malignancy. He was unable to undergo repeat Chest CT secondary to not being a year out from his last one (per insurance). He is down 5 more pounds since August despite eating more.  -Schedule CT chest.   Recent flu Patient had the flu a month ago and was treated with tamiflu. He continued to have symptoms a couple of weeks later and was given a z-pack and oral steroids. He had asked for a refill of his albuterol inhaler but it was never sent in. Agreed to do a one time refill for the patient. Rhonchi in upper lungs bilaterally that clears with cough, mild wheezing throughout, diminished breath sounds bilateral bases.  -Refill albuterol.   Disposition: Carotid duplex. Schedule  CT chest. Return in 6 months or sooner as needed.          Signed, Etta Grandchild. Jamille Yoshino, DNP, NP-C

## 2023-12-01 ENCOUNTER — Encounter: Payer: Self-pay | Admitting: Student

## 2023-12-01 ENCOUNTER — Ambulatory Visit: Payer: 59 | Attending: Student | Admitting: Student

## 2023-12-01 VITALS — BP 110/50 | HR 63 | Ht 71.0 in | Wt 148.4 lb

## 2023-12-01 DIAGNOSIS — E785 Hyperlipidemia, unspecified: Secondary | ICD-10-CM

## 2023-12-01 DIAGNOSIS — R634 Abnormal weight loss: Secondary | ICD-10-CM

## 2023-12-01 DIAGNOSIS — I779 Disorder of arteries and arterioles, unspecified: Secondary | ICD-10-CM

## 2023-12-01 DIAGNOSIS — J449 Chronic obstructive pulmonary disease, unspecified: Secondary | ICD-10-CM | POA: Diagnosis not present

## 2023-12-01 DIAGNOSIS — I739 Peripheral vascular disease, unspecified: Secondary | ICD-10-CM

## 2023-12-01 DIAGNOSIS — Z720 Tobacco use: Secondary | ICD-10-CM

## 2023-12-01 MED ORDER — ALBUTEROL SULFATE HFA 108 (90 BASE) MCG/ACT IN AERS: 1.0000 | INHALATION_SPRAY | Freq: Four times a day (QID) | RESPIRATORY_TRACT | 0 refills | Status: DC | PRN

## 2023-12-01 NOTE — Patient Instructions (Signed)
Medication Instructions:   Your Physician recommend you continue on your current medication as directed.     *If you need a refill on your cardiac medications before your next appointment, please call your pharmacy*   Lab Work: None ordered.   If you have labs (blood work) drawn today and your tests are completely normal, you will receive your results only by: MyChart Message (if you have MyChart) OR A paper copy in the mail If you have any lab test that is abnormal or we need to change your treatment, we will call you to review the results.   Testing/Procedures: Your physician has requested that you have a carotid duplex. This test is an ultrasound of the carotid arteries in your neck. It looks at blood flow through these arteries that supply the brain with blood.   Allow one hour for this exam.  There are no restrictions or special instructions.  This will take place at [address] [facility]) [address], [place] [address]  Please note: We ask at that you not bring children with you during ultrasound (echo/ vascular) testing. Due to room size and safety concerns, children are not allowed in the ultrasound rooms during exams. Our front office staff cannot provide observation of children in our lobby area while testing is being conducted. An adult accompanying a patient to their appointment will only be allowed in the ultrasound room at the discretion of the ultrasound technician under special circumstances. We apologize for any inconvenience.    Follow-Up: At [facility], you and your health needs are our priority.  As part of our continuing mission to provide you with exceptional heart care, we have created designated Provider Care Teams.  These Care Teams include your primary Cardiologist (physician) and Advanced Practice Providers (APPs -  Physician Assistants and Nurse Practitioners) who all work together to provide you with the care you need, when you need  it.  We recommend signing up for the patient portal called "MyChart".  Sign up information is provided on this After Visit Summary.  MyChart is used to connect with patients for Virtual Visits (Telemedicine).  Patients are able to view lab/test results, encounter notes, upcoming appointments, etc.  Non-urgent messages can be sent to your provider as well.   To learn more about what you can do with MyChart, go to [email or web address].    Your next appointment:   6 month(s)  Provider:   [name], MD or Carlos Levering, NP

## 2023-12-05 ENCOUNTER — Ambulatory Visit: Payer: 59 | Admitting: Physician Assistant

## 2023-12-05 ENCOUNTER — Telehealth: Payer: Self-pay | Admitting: *Deleted

## 2023-12-05 VITALS — BP 135/70 | HR 76 | Ht 72.0 in | Wt 148.0 lb

## 2023-12-05 DIAGNOSIS — R351 Nocturia: Secondary | ICD-10-CM | POA: Diagnosis not present

## 2023-12-05 DIAGNOSIS — N401 Enlarged prostate with lower urinary tract symptoms: Secondary | ICD-10-CM

## 2023-12-05 DIAGNOSIS — R350 Frequency of micturition: Secondary | ICD-10-CM

## 2023-12-05 DIAGNOSIS — R634 Abnormal weight loss: Secondary | ICD-10-CM

## 2023-12-05 LAB — URINALYSIS, COMPLETE
Bilirubin, UA: NEGATIVE
Glucose, UA: NEGATIVE
Ketones, UA: NEGATIVE
Leukocytes,UA: NEGATIVE
Nitrite, UA: NEGATIVE
Protein,UA: NEGATIVE
RBC, UA: NEGATIVE
Specific Gravity, UA: 1.02 (ref 1.005–1.030)
Urobilinogen, Ur: 0.2 mg/dL (ref 0.2–1.0)
pH, UA: 6 (ref 5.0–7.5)

## 2023-12-05 LAB — MICROSCOPIC EXAMINATION: Bacteria, UA: NONE SEEN

## 2023-12-05 MED ORDER — TAMSULOSIN HCL 0.4 MG PO CAPS: 0.4000 mg | ORAL_CAPSULE | Freq: Every day | ORAL | 11 refills | Status: DC

## 2023-12-05 NOTE — Telephone Encounter (Signed)
Left VM for pt to call to scheduled lung screening CT scan. SDMV was documented in care everywhere from 08/31/22. Last CT was done 09/2022. Pt is due for yearly scan.

## 2023-12-05 NOTE — Progress Notes (Signed)
 12/05/2023 3:09 PM   Raymond Lowery 1956/06/28 [id number]  CC: Chief Complaint  Patient presents with   Urinary Frequency   HPI: Raymond Lowery is a 68 y.o. male with PMH hydrocele s/p right hydrocelectomy in 2023, ED on sildenafil 20 mg, Peyronie's disease, nephrolithiasis, and BPH not on pharmacotherapy who presents today for evaluation of urinary frequency.  He is accompanied today by his wife, who contributes to HPI.  Today he reports chronic nocturia x 3-4.  He describes daytime frequency x 2-3.  No urinary incontinence.  He does not snore, but his wife states that he wheezes overnight.  He had a remote sleep study, which he thinks was negative for sleep apnea.  He thinks his nocturia has been worsening in the past 6 months.  Notably, he describes 10 to 20 pounds of unintentional weight loss over the past year as well as night sweats.  He denies any new aches or pains, but admits to a chronic cough and some recent runny nose after he was treated for Flu A last month.  He is a smoker with a 100+ pack year smoking history.  He denies gross hematuria.  Dr. Darron has already ordered him a CT chest.  IPSS 21/mixed as below.  PVR 116 mL.   IPSS     Row Name 12/05/23 1400         International Prostate Symptom Score   How often have you had the sensation of not emptying your bladder? Less than half the time     How often have you had to urinate less than every two hours? Almost always     How often have you found you stopped and started again several times when you urinated? Less than half the time     How often have you found it difficult to postpone urination? More than half the time     How often have you had a weak urinary stream? About half the time     How often have you had to strain to start urination? Less than 1 in 5 times     How many times did you typically get up at night to urinate? 4 Times     Total IPSS Score 21       Quality of Life due to urinary  symptoms   If you were to spend the rest of your life with your urinary condition just the way it is now how would you feel about that? Mixed               PMH: Past Medical History:  Diagnosis Date   Acute right MCA stroke (HCC) 10/09/2017   a.) CT head 10/09/2017: acute RIGHT MCA infarct involving the basal ganglia, RIGHT insula, and frontal operculum air cortices --> mechanical thrombectomy performed.   Aortic atheroma 01/26/2018   Carotid artery disease (HCC) 10/09/2017   a.) s/p PTA 10/09/2017 --> stenting (9-7 x 40 mm Xact) of the RICA   COPD (chronic obstructive pulmonary disease) (HCC)    Hepatitis C    a.) treated with glecaprevir -pibrentasvir ; follow up SVR12 viral load undetectable 09/07/2018   Hyperlipidemia    Liver fibrosis    Long term current use of antithrombotics/antiplatelets    a.) clopidogrel    PAD (peripheral artery disease) (HCC)    Known occluded distal right SFA with moderately reduced ABI   PFO (patent foramen ovale)    a.) TEE 01/26/2018: EF 55-60%, mod PFO with mod R-L shunt  Pneumonia 2012   Right hydrocele    Tobacco use     Surgical History: Past Surgical History:  Procedure Laterality Date   BACK SURGERY     neck   HYDROCELE EXCISION Right 10/10/2022   Procedure: HYDROCELECTOMY ADULT;  Surgeon: Penne Knee, MD;  Location: [facility] ORS;  Service: Urology;  Laterality: Right;   IR ANGIO EXTRACRAN SEL COM CAROTID INNOMINATE UNI L MOD SED  10/09/2017   IR ANGIO VERTEBRAL SEL SUBCLAVIAN INNOMINATE UNI R MOD SED  10/09/2017   IR INTRAVSC STENT CERV CAROTID W/O EMB-PROT MOD SED INC ANGIO  10/09/2017   IR PERCUTANEOUS ART THROMBECTOMY/INFUSION INTRACRANIAL INC DIAG ANGIO  10/09/2017   IR RADIOLOGIST EVAL & MGMT  11/06/2017   KNEE ARTHROSCOPY Left    RADIOLOGY WITH ANESTHESIA N/A 10/09/2017   Procedure: RADIOLOGY WITH ANESTHESIA;  Surgeon: Radiologist, Medication, MD;  Location: [facility] OR;  Service: Radiology;  Laterality: N/A;   TEE WITHOUT  CARDIOVERSION N/A 01/26/2018   Procedure: TRANSESOPHAGEAL ECHOCARDIOGRAM (TEE);  Surgeon: Ladona Heinz, MD;  Location: [facility] END[facility]COPY;  Service: Cardiovascular;  Laterality: N/A;    Home Medications:  Allergies as of 12/05/2023       Reactions   Doxycycline Rash   Rash on the back         Medication List        Accurate as of December 05, 2023  3:09 PM. If you have any questions, ask your nurse or doctor.          albuterol  108 (90 Base) MCG/ACT inhaler Commonly known as: VENTOLIN  HFA Inhale 1-2 puffs into the lungs every 6 (six) hours as needed.   atorvastatin  40 MG tablet Commonly known as: LIPITOR TAKE 1 TABLET(40 MG) BY MOUTH DAILY AT 6 PM   clopidogrel  75 MG tablet Commonly known as: PLAVIX  TAKE 1 TABLET(75 MG) BY MOUTH DAILY   fluticasone 50 MCG/ACT nasal spray Commonly known as: FLONASE Place into both nostrils.        Allergies:  Allergies  Allergen Reactions   Doxycycline Rash    Rash on the back     Family History: Family History  Problem Relation Age of Onset   COPD Mother    Dementia Father     Social History:   reports that he has been smoking cigarettes. He has a 60 pack-year smoking history. His smokeless tobacco use includes chew. He reports that he does not currently use alcohol. He reports that he does not use drugs.  Physical Exam: BP 135/70   Pulse 76   Ht 6' (1.829 m)   Wt 148 lb (67.1 kg)   BMI 20.07 kg/m   Constitutional:  Alert and oriented, no acute distress, nontoxic appearing HEENT: [place], AT Cardiovascular: No clubbing, cyanosis, or edema Respiratory: Normal respiratory effort, no increased work of breathing Skin: No rashes, bruises or suspicious lesions Neurologic: Grossly intact, no focal deficits, moving all 4 extremities Psychiatric: Normal mood and affect  Laboratory Data: Results for orders placed or performed in visit on 12/05/23  Microscopic Examination   Collection Time: 12/05/23  3:39 PM   Urine  Result Value  Ref Range   WBC, UA 0-5 0 - 5 /hpf   RBC, Urine 0-2 0 - 2 /hpf   Epithelial Cells (non renal) 0-10 0 - 10 /hpf   Bacteria, UA None seen None seen/Few  Urinalysis, Complete   Collection Time: 12/05/23  3:39 PM  Result Value Ref Range   Specific Gravity, UA 1.020 1.005 - 1.030  pH, UA 6.0 5.0 - 7.5   Color, UA Yellow Yellow   Appearance Ur Clear Clear   Leukocytes,UA Negative Negative   Protein,UA Negative Negative/Trace   Glucose, UA Negative Negative   Ketones, UA Negative Negative   RBC, UA Negative Negative   Bilirubin, UA Negative Negative   Urobilinogen, Ur 0.2 0.2 - 1.0 mg/dL   Nitrite, UA Negative Negative   Microscopic Examination See below:    Assessment & Plan:   1. Benign prostatic hyperplasia with nocturia (Primary) He primarily reports weak stream, frequency, and nocturia.  PVR is high normal.  Will start him on Flomax  and check him again in 6 weeks, considering OAB meds at that time.  I had a lengthy conversation with him and his wife today that nocturia is the most challenging urologic symptom to treat because it is often a symptom of other medical problems.  We discussed the role of undiagnosed sleep apnea and nocturia.  He is adamantly opposed to a repeat sleep study and states he would not wear a CPAP if recommended.  We discussed that we can try to manage his symptoms with pharmacotherapy, however we may not be able to get this to resolve if we are not addressing the underlying problem.  He understands this.  Last PSA was less than 1 over a year ago.  Will repeat today per patient request, though given his age we could do this every 2 years moving forward barring any significant changes. - Urinalysis, Complete - Bladder Scan (Post Void Residual) in office - tamsulosin  (FLOMAX ) 0.4 MG CAPS capsule; Take 1 capsule (0.4 mg total) by mouth daily.  Dispense: 30 capsule; Refill: 11 - PSA Total (Reflex To Free)  2. Unintentional weight loss We discussed that his  unintentional weight loss is concerning for a possible underlying malignancy.  Agree with CT chest per cardiology.  Will also check a UA today and pursue CT urogram and cystoscopy for hematuria workup if there is any microscopic hematuria noted.  If no micro heme, will defer hematuria workup.  We did discuss that if he develops gross hematuria in the future, he should make us  aware so we can pursue CT imaging at that time.  Return in about 6 weeks (around 01/16/2024) for Symptom recheck, IPSS, PVR.  Lucie Hones, PA-C  [facility] [place] [address], [address] [place], [place] [address] [phone]

## 2023-12-06 LAB — PSA TOTAL (REFLEX TO FREE): Prostate Specific Ag, Serum: 1.4 ng/mL (ref 0.0–4.0)

## 2023-12-07 ENCOUNTER — Other Ambulatory Visit: Payer: Self-pay | Admitting: *Deleted

## 2023-12-07 DIAGNOSIS — Z87891 Personal history of nicotine dependence: Secondary | ICD-10-CM

## 2023-12-07 DIAGNOSIS — Z122 Encounter for screening for malignant neoplasm of respiratory organs: Secondary | ICD-10-CM

## 2023-12-07 DIAGNOSIS — F17210 Nicotine dependence, cigarettes, uncomplicated: Secondary | ICD-10-CM

## 2023-12-11 ENCOUNTER — Ambulatory Visit (HOSPITAL_BASED_OUTPATIENT_CLINIC_OR_DEPARTMENT_OTHER)
Admission: RE | Admit: 2023-12-11 | Discharge: 2023-12-11 | Disposition: A | Discharge status: Home / Self Care | Payer: 59 | Source: Ambulatory Visit | Attending: Obstetrics and Gynecology | Admitting: Obstetrics and Gynecology

## 2023-12-11 ENCOUNTER — Encounter (HOSPITAL_BASED_OUTPATIENT_CLINIC_OR_DEPARTMENT_OTHER): Payer: Self-pay

## 2023-12-11 DIAGNOSIS — Z122 Encounter for screening for malignant neoplasm of respiratory organs: Secondary | ICD-10-CM | POA: Diagnosis present

## 2023-12-11 DIAGNOSIS — F17210 Nicotine dependence, cigarettes, uncomplicated: Secondary | ICD-10-CM | POA: Diagnosis present

## 2023-12-11 DIAGNOSIS — Z87891 Personal history of nicotine dependence: Secondary | ICD-10-CM | POA: Diagnosis present

## 2023-12-18 ENCOUNTER — Other Ambulatory Visit: Payer: Self-pay | Admitting: Cardiology

## 2023-12-18 ENCOUNTER — Encounter: Payer: Self-pay | Admitting: Neurology

## 2023-12-18 ENCOUNTER — Ambulatory Visit: Payer: 59 | Admitting: Neurology

## 2023-12-18 VITALS — BP 119/59 | HR 63 | Ht 71.0 in | Wt 151.0 lb

## 2023-12-18 DIAGNOSIS — I671 Cerebral aneurysm, nonruptured: Secondary | ICD-10-CM

## 2023-12-18 DIAGNOSIS — G3184 Mild cognitive impairment, so stated: Secondary | ICD-10-CM

## 2023-12-18 DIAGNOSIS — Z8673 Personal history of transient ischemic attack (TIA), and cerebral infarction without residual deficits: Secondary | ICD-10-CM

## 2023-12-18 DIAGNOSIS — I63231 Cerebral infarction due to unspecified occlusion or stenosis of right carotid arteries: Secondary | ICD-10-CM

## 2023-12-18 NOTE — Progress Notes (Signed)
[facility] [address] [place]. [place] [address] [phone]       OFFICE CONSULT NOTE  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  He is accompanied by his wife.  History is obtained from them and review of electronic medical records.  I personally reviewed pertinent available imaging films in PACS.  He has past medical history of hyperlipidemia, peripheral arterial disease COPD, hepatitis C, carotid disease, right MCA stroke in December 2018 s/p mechanical thrombectomy and right carotid stenting.  Patient states is done well since his stroke.  He has only minimal residual left sided weakness that is noticeable only when his tired he tends to drag his leg.  He has had no recurrent strokes.  He is tolerating Plavix well without significant bleeding and only minor bruising.  He remains on Lipitor which is tolerating well without muscle aches and pains.  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  He does admit to mild short-term memory difficulties that is noticed.  These are not progressive or troublesome.  He may remember things later but not at the moment.  He still continues to smoke and has not quit.  Recent lab work on 12/14/2023 shows hemoglobin A1c seem to be 5.7 and LDL cholesterol on 05/31/2023 was 34 mg percent.  Last MRI scan of the brain on 10//23 showed old right MCA infarct without acute abnormality.  1 to 2 mm right supraclinoid ICA aneurysm versus infundibulum was noted.  Carotid ultrasound on 10/27/2022 showed patent right ICA stent without significant restenosis.  Patient was last seen in the  office for a follow-up on 02/18/2019 by Shanda Bumps nurse practitioner and was then lost to follow-up. Teleneuro visit 02/18/19 Shanda Bumps, NP ) : Raymond Lowery is a 68 y.o. male with underlying medical history of tobacco use, PAD, HLD, asthma and right MCA infarct secondary to right MCA occlusion due to artery embolism from right ICA stenosis treated with mechanical embolectomy and rescue stenting in 09/2017.  He has been followed in this office since this time.  He was initially scheduled today for routine follow-up visit but due to COVID-19 safety precautions, visit transitioned to telemedicine via WebEx.  He was last evaluated on 08/15/18 with complaints of intermittent left upper and lower extremity jerking and intermittent leg cramps likely due to claudication.  He continues to be followed by cardiology Dr. Kirke Corin for PAD management and at this time patient elected to continue with medical therapy and hold off on any endovascular intervention.  Has been stable. He continues to smoke approx 1 pack per day which has been decreased from 2 packs prior while at work. He did undergo repeat carotid duplex on 10/11/2018 which was unremarkable and recommended repeat carotid duplex in 6 months time along with ongoing follow-up with vascular surgery.  He has continued on DAPT with aspirin and Plavix despite prior recommendations of discontinuing Plavix and continuing aspirin only but denies side effects of bleeding or bruising.  He continues on atorvastatin without side effects myalgias.  Blood pressure stable.  Denies new or worsening stroke/TIA symptoms.   ROS:   14 system review of systems is positive for  memory difficulties, tiredness, dragging leg only all other systems negative  PMH:  Past Medical History:  Diagnosis Date   Acute right MCA stroke (HCC) 10/09/2017   a.) CT head 10/09/2017: acute RIGHT MCA infarct involving the basal ganglia, RIGHT insula, and frontal operculum air cortices --> mechanical  thrombectomy performed.   Aortic atheroma 01/26/2018   Carotid artery disease (HCC) 10/09/2017   a.) s/p PTA 10/09/2017 --> stenting (9-7 x 40 mm Xact) of the RICA   COPD (chronic obstructive pulmonary disease) (HCC)    Hepatitis C    a.) treated with glecaprevir-pibrentasvir; follow up SVR12 viral load undetectable 09/07/2018   Hyperlipidemia    Liver fibrosis    Long term current use of antithrombotics/antiplatelets    a.) clopidogrel   PAD (peripheral artery disease) (HCC)    Known occluded distal right SFA with moderately reduced ABI   PFO (patent foramen ovale)    a.) TEE 01/26/2018: EF 55-60%, mod PFO with mod R-L shunt   Pneumonia 2012   Right hydrocele    Tobacco use     Social History:  Social History   Socioeconomic History   Marital status: Married    Spouse name: Not on file   Number of children: Not on file   Years of education: Not on file   Highest education level: Not on file  Occupational History   Not on file  Tobacco Use   Smoking status: Every Day    Current packs/day: 1.50    Average packs/day: 1.5 packs/day for 40.0 years (60.0 ttl pk-yrs)    Types: Cigarettes   Smokeless tobacco: Current    Types: Chew   Tobacco comments:    Currently smokes 2 packs a day.     06/21/2023  Vaping Use   Vaping status: Never Used  Substance and Sexual Activity   Alcohol use: Not Currently   Drug use: No   Sexual activity: Yes  Other Topics Concern   Not on file  Social History Narrative   Not on file   Social Drivers of Health   Financial Resource Strain: Low Risk  (12/14/2023)   Received from [facility]   Overall Financial Resource Strain (CARDIA)    Difficulty of Paying Living Expenses: Not hard at all  Food Insecurity: No Food Insecurity (12/14/2023)   [facility]   Hunger Vital Sign    Worried About Running Out of Food in the Last Year: Never true    Ran Out of Food in the Last Year: Never true  Transportation Needs: No  Transportation Needs (12/14/2023)   Received from [facility] - Transportation    Lack of Transportation (Medical): No    Lack of Transportation (Non-Medical): No  Physical Activity: Sufficiently Active (10/31/2023)   [facility] from [facility]   Exercise Vital Sign    Days of Exercise per Week: 7 days    Minutes of Exercise per Session: 60 min  Stress: No Stress Concern Present (10/31/2023)   Received from [facility] - Occupational Stress Questionnaire    Feeling of Stress : Not at all  Social Connections: Socially Integrated (10/31/2023)   Received from [facility]   Social Network    How would you rate your social network (family, work, friends)?: Good participation with social networks  Intimate Partner Violence: Not At Risk (10/31/2023)   Received from [facility]   HITS    Over the last 12 months  how often did your partner physically hurt you?: Never    Over the last 12 months how often did your partner insult you or talk down to you?: Never    Over the last 12 months how often did your partner threaten you with physical harm?: Never    Over the last 12 months how often did your partner scream or curse at you?: Never    Medications:   Current Outpatient Medications on File Prior to Visit  Medication Sig Dispense Refill   albuterol (VENTOLIN HFA) 108 (90 Base) MCG/ACT inhaler Inhale 1-2 puffs into the lungs every 6 (six) hours as needed. 1 each 0   clopidogrel (PLAVIX) 75 MG tablet TAKE 1 TABLET(75 MG) BY MOUTH DAILY 289.286 tablet 1   fluticasone (FLONASE) 50 MCG/ACT nasal spray Place into both nostrils.     No current facility-administered medications on file prior to visit.    Allergies:   Allergies  Allergen Reactions   Doxycycline Rash    Rash on the back     Physical Exam General: well developed, well nourished, seated, in no evident distress Head: head normocephalic and atraumatic.   Neck: supple  with no carotid or supraclavicular bruits Cardiovascular: regular rate and rhythm, no murmurs Musculoskeletal: no deformity Skin:  no rash/petichiae Vascular:  Normal pulses all extremities  Neurologic Exam Mental Status: Awake and fully alert. Oriented to place and time. Recent and remote memory intact. Attention span, concentration and fund of knowledge appropriate. Mood and affect appropriate.  Diminished recall 2/3.  Able to name 9 animals which can walk on 4 legs.  Clock drawing 4/4. Cranial Nerves: Fundoscopic exam reveals sharp disc margins. Pupils equal, briskly reactive to light. Extraocular movements full without nystagmus. Visual fields full to confrontation. Hearing intact. Facial sensation intact. Face, tongue, palate moves normally and symmetrically.  Motor: Normal bulk and tone. Normal strength in all tested extremity muscles.  Diminished fine finger movements on the left.  Orbits right over left upper extremity. Sensory.: intact to touch , pinprick , position and vibratory sensation.  Coordination: Rapid alternating movements normal in all extremities. Finger-to-nose and heel-to-shin performed accurately bilaterally. Gait and Station: Arises from chair without difficulty. Stance is normal. Gait demonstrates normal stride length and balance . Able to heel, toe and tandem walk without difficulty.  Reflexes: 1+ and symmetric. Toes downgoing.   NIHSS  0 Modified Rankin  1   ASSESSMENT: 68 year old Caucasian male with remote history of right middle cerebral artery infarct status post mechanical right carotid stenting in December 2018 who is doing very well with minimal residual deficits.  He also has a small intracranial aneurysm/infundibulum.  Vascular risk factors of hypertension, hyperlipidemia, smoking and carotid stenosis.  He also is complaining of mild cognitive impairment poststroke.     PLAN:I had a long d/w patient and his wife about his remote stroke, small intracranial  aneurysm, mild cognitive impairment risk for recurrent stroke/TIAs, personally independently reviewed imaging studies and stroke evaluation results and answered questions.Continue Plavix 75 mg daily for secondary stroke prevention and maintain strict control of hypertension with blood pressure goal below 130/90, diabetes with hemoglobin A1c goal below 6.5% and lipids with LDL cholesterol goal below 70 mg/dL. I also advised the patient to eat a healthy diet with plenty of whole grains, cereals, fruits and vegetables, exercise regularly and maintain ideal body weight I have strongly encouraged him to quit smoking completely and he says he will try.  Check follow-up MRI scan and MRI of the  brain for his small intracranial aneurysm.  I advised him to keep his scheduled appointment for screening carotid ultrasound later this week.  Patient had brought Department of Transportation paperwork which was filled out and he was cleared to continue  driving as requested.  I also advised him to increase participation in cognitively challenging activities like solving crossword puzzles, playing bridge and sudoku.  We also discussed memory compensation strategies.  Followup in the future with me in the future only as needed. Greater than 50% time during this 45-minute consultation visit was spent in counseling and coordination of care about his remote stroke and minimum poststroke residual symptoms as well as mild cognitive impairment and answering questions. Delia Heady, MD Note: This document was prepared with digital dictation and possible smart phrase technology. Any transcriptional errors that result from this process are unintentional.

## 2023-12-18 NOTE — Patient Instructions (Signed)
I had a long d/w patient and his wife about his remote stroke, small intracranial aneurysm, mild cognitive impairment risk for recurrent stroke/TIAs, personally independently reviewed imaging studies and stroke evaluation results and answered questions.Continue Plavix 75 mg daily for secondary stroke prevention and maintain strict control of hypertension with blood pressure goal below 130/90, diabetes with hemoglobin A1c goal below 6.5% and lipids with LDL cholesterol goal below 70 mg/dL. I also advised the patient to eat a healthy diet with plenty of whole grains, cereals, fruits and vegetables, exercise regularly and maintain ideal body weight I have strongly encouraged him to quit smoking completely and he says he will try.  Check follow-up MRI scan and MRI of the brain for his small intracranial aneurysm.  I advised him to keep his scheduled appointment for screening carotid ultrasound later this week.  Patient had brought Department of Transportation paperwork which was filled out and he was cleared to continue  driving as requested.  I also advised him to increase participation in cognitively challenging activities like solving crossword puzzles, playing bridge and sudoku.  We also discussed memory compensation strategies.  Followup in the future with me in the future only as needed.  Memory Compensation Strategies  Use "WARM" strategy.  W= write it down  A= associate it  R= repeat it  M= make a mental note  2.   You can keep a Glass blower/designer.  Use a 3-ring notebook with sections for the following: calendar, important names and phone numbers,  medications, doctors' names/phone numbers, lists/reminders, and a section to journal what you did  each day.   3.    Use a calendar to write appointments down.  4.    Write yourself a schedule for the day.  This can be placed on the calendar or in a separate section of the Memory Notebook.  Keeping a  regular schedule can help memory.  5.    Use  medication organizer with sections for each day or morning/evening pills.  You may need help loading it  6.    Keep a basket, or pegboard by the door.  Place items that you need to take out with you in the basket or on the pegboard.  You may also want to  include a message board for reminders.  7.    Use sticky notes.  Place sticky notes with reminders in a place where the task is performed.  For example: " turn off the  stove" placed by the stove, "lock the door" placed on the door at eye level, " take your medications" on  the bathroom mirror or by the place where you normally take your medications.  8.    Use alarms/timers.  Use while cooking to remind yourself to check on food or as a reminder to take your medicine, or as a  reminder to make a call, or as a reminder to perform another task, etc.

## 2023-12-24 ENCOUNTER — Other Ambulatory Visit: Payer: Self-pay | Admitting: Student

## 2023-12-25 ENCOUNTER — Other Ambulatory Visit: Payer: Self-pay

## 2023-12-25 DIAGNOSIS — F17210 Nicotine dependence, cigarettes, uncomplicated: Secondary | ICD-10-CM

## 2023-12-25 DIAGNOSIS — Z87891 Personal history of nicotine dependence: Secondary | ICD-10-CM

## 2023-12-25 DIAGNOSIS — Z122 Encounter for screening for malignant neoplasm of respiratory organs: Secondary | ICD-10-CM

## 2023-12-27 ENCOUNTER — Telehealth: Payer: Self-pay | Admitting: Neurology

## 2023-12-27 NOTE — Telephone Encounter (Signed)
 MRI brain [facility] auth: [id number] exp. 12/27/23-02/10/24  MRA head [employer] auth: [id number] exp. 12/27/23-02/10/24 sent to GI [phone]

## 2024-01-02 ENCOUNTER — Ambulatory Visit: Payer: 59 | Attending: Student

## 2024-01-02 DIAGNOSIS — I779 Disorder of arteries and arterioles, unspecified: Secondary | ICD-10-CM | POA: Diagnosis not present

## 2024-01-02 DIAGNOSIS — I6522 Occlusion and stenosis of left carotid artery: Secondary | ICD-10-CM | POA: Diagnosis not present

## 2024-01-17 ENCOUNTER — Ambulatory Visit: Payer: 59 | Admitting: Physician Assistant

## 2024-02-07 ENCOUNTER — Ambulatory Visit: Payer: 59 | Admitting: Emergency Medicine

## 2024-02-07 ENCOUNTER — Encounter: Payer: Self-pay | Admitting: Emergency Medicine

## 2024-02-07 VITALS — BP 118/62 | HR 69 | Ht 65.0 in | Wt 162.6 lb

## 2024-02-07 DIAGNOSIS — R9389 Abnormal findings on diagnostic imaging of other specified body structures: Secondary | ICD-10-CM | POA: Diagnosis not present

## 2024-02-07 DIAGNOSIS — Z720 Tobacco use: Secondary | ICD-10-CM | POA: Diagnosis not present

## 2024-02-07 DIAGNOSIS — J449 Chronic obstructive pulmonary disease, unspecified: Secondary | ICD-10-CM | POA: Diagnosis not present

## 2024-02-07 MED ORDER — STIOLTO RESPIMAT 2.5-2.5 MCG/ACT IN AERS: 2.0000 | INHALATION_SPRAY | Freq: Every day | RESPIRATORY_TRACT | 11 refills | Status: AC

## 2024-02-07 MED ORDER — ALBUTEROL SULFATE HFA 108 (90 BASE) MCG/ACT IN AERS: 1.0000 | INHALATION_SPRAY | Freq: Four times a day (QID) | RESPIRATORY_TRACT | 0 refills | Status: DC | PRN

## 2024-02-07 NOTE — Assessment & Plan Note (Signed)
 Reviewed his lung cancer screening CT from February.  He has a history of left-sided rib fractures, fibrothorax and calcifications from prior trauma.  Some scattered pulmonary nodules that are stable including one that measures 5 mm.  He will need a repeat screening CT at the 1 year mark which is February 2026.

## 2024-02-07 NOTE — Patient Instructions (Addendum)
 We reviewed your CT scan of the chest today. You need a repeat lung cancer screening CT chest in 1 year, February 2026. Please try to work on decreasing your cigarettes as much as possible. Try starting Stiolto 2 puffs once daily.  Take this every day on a schedule as a maintenance medication. Keep albuterol available to use 2 puffs up to every 4 hours if needed for shortness of breath, chest tightness, wheezing.  Follow with APP in 6 months. Follow Dr. Delton Coombes in February 2026 after your CT chest so we can review those results together.

## 2024-02-07 NOTE — Progress Notes (Signed)
   Subjective:    Patient ID: Raymond Lowery, male    DOB: 1956/09/09, 68 y.o.   MRN: [id number]  HPI  ROV 06/21/2023 --Mr. Astorga is 40 and has a history of heavy tobacco use and COPD.  I seen him for this as well as pulmonary nodular disease on CT chest.  This was stable going back on serial imaging Today he reports that he had a LDCT in 09/2022 in [place] on [address] that was reassuring. I don't have that film Still smoking 1.5 pk/day.  Using albuterol as needed. Once a month.  Occasional SOB w exertion, lot of walking, gardening. Some am cough, non-productive.    ROV 02/07/24 --follow-up visit for 68 year old man with history of heavy tobacco abuse and associated COPD.  He has pulmonary nodular disease on CT scan of the chest that had been stable on serial imaging so we planned to get back into the lung cancer screening program.  When I saw him in August I tried starting him on Spiriva to see if you get benefit.  He continues to smoke approximately 2-3 pk/day. He was treated for an AE-COPD in setting of flu in January. He is feeling better now. Has albuterol that he uses as needed. He is active. Wife notes that his breathing does limit him some. She hears wheeze. He did a trial of spiriva last time - felt that he benefited.   Lung cancer screening CT chest done 12/11/2023 reviewed by me showed no mediastinal or hilar adenopathy, mild centrilobular emphysema with left-sided fibrothorax, calcified and noncalcified pulmonary nodules including 5 mm nodule.   Review of Systems As per HPI     Objective:   Physical Exam Vitals:   02/07/24 1540  BP: 118/62  Pulse: 69  SpO2: 99%  Weight: 162 lb 9.6 oz (73.8 kg)  Height: 5\' 5"  (1.651 m)    Gen: Pleasant, well-nourished, in no distress,  normal affect  ENT: No lesions,  mouth clear,  oropharynx clear, no postnasal drip  Neck: No JVD, no stridor  Lungs: No use of accessory muscles, distant.  Clear on the right, some inspiratory  rhonchi on the left  Cardiovascular: RRR, heart sounds normal, no murmur or gallops, no peripheral edema  Musculoskeletal: No deformities, no cyanosis or clubbing  Neuro: alert, awake, non focal  Skin: Warm, no lesions or rash      Assessment & Plan:   Abnormal CT of the chest Reviewed his lung cancer screening CT from February.  He has a history of left-sided rib fractures, fibrothorax and calcifications from prior trauma.  Some scattered pulmonary nodules that are stable including one that measures 5 mm.  He will need a repeat screening CT at the 1 year mark which is February 2026.  Tobacco abuse Acknowledged today that he smokes up to 3 packs daily.  He would like to cut down, is not in a position to set a quit date.  He is going to try to work on curtailing his use.  COPD (chronic obstructive pulmonary disease) (HCC) He benefited from Spiriva trial.  I think we should put him on Stiolto as maintenance.  He is willing to try this.  Prescription sent today.  Keep his albuterol available to use as needed.  Time spent 47 minutes  Levy Pupa, MD, PhD 02/07/2024, 4:24 PM [facility] [phone] or if no answer before 7:00PM call [phone] For any issues after 7:00PM please call eLink [phone]

## 2024-02-07 NOTE — Assessment & Plan Note (Signed)
 He benefited from Spiriva trial.  I think we should put him on Stiolto as maintenance.  He is willing to try this.  Prescription sent today.  Keep his albuterol available to use as needed.

## 2024-02-07 NOTE — Assessment & Plan Note (Signed)
 Acknowledged today that he smokes up to 3 packs daily.  He would like to cut down, is not in a position to set a quit date.  He is going to try to work on curtailing his use.

## 2024-02-28 ENCOUNTER — Ambulatory Visit: Admitting: Physician Assistant

## 2024-03-02 ENCOUNTER — Other Ambulatory Visit: Payer: Self-pay | Admitting: Emergency Medicine

## 2024-04-27 ENCOUNTER — Encounter (HOSPITAL_COMMUNITY): Payer: Self-pay | Admitting: Interventional Radiology

## 2024-08-28 ENCOUNTER — Other Ambulatory Visit: Payer: Self-pay | Admitting: Cardiology

## 2024-08-28 DIAGNOSIS — I63231 Cerebral infarction due to unspecified occlusion or stenosis of right carotid arteries: Secondary | ICD-10-CM

## 2024-08-28 MED ORDER — ATORVASTATIN CALCIUM 40 MG PO TABS: ORAL_TABLET | ORAL | 0 refills | Status: AC

## 2024-11-22 ENCOUNTER — Other Ambulatory Visit: Payer: Self-pay

## 2024-11-29 MED ORDER — CLOPIDOGREL BISULFATE 75 MG PO TABS: 75.0000 mg | ORAL_TABLET | Freq: Every day | ORAL | 0 refills | Status: AC

## 2024-11-29 NOTE — Telephone Encounter (Signed)
 Labs completed on 12/14/23
